# Patient Record
Sex: Female | Born: 1995 | Race: White | Hispanic: No | Marital: Married | Smoking: Current every day smoker
Health system: Southern US, Community
[De-identification: ages and names within clinical notes are randomized; demographics above are authoritative.]

## PROBLEM LIST (undated history)

## (undated) DIAGNOSIS — Z8489 Family history of other specified conditions: Secondary | ICD-10-CM

## (undated) DIAGNOSIS — O24419 Gestational diabetes mellitus in pregnancy, unspecified control: Secondary | ICD-10-CM

## (undated) DIAGNOSIS — R87619 Unspecified abnormal cytological findings in specimens from cervix uteri: Secondary | ICD-10-CM

## (undated) DIAGNOSIS — E049 Nontoxic goiter, unspecified: Secondary | ICD-10-CM

## (undated) DIAGNOSIS — Z9889 Other specified postprocedural states: Secondary | ICD-10-CM

## (undated) DIAGNOSIS — N189 Chronic kidney disease, unspecified: Secondary | ICD-10-CM

## (undated) DIAGNOSIS — E039 Hypothyroidism, unspecified: Secondary | ICD-10-CM

## (undated) DIAGNOSIS — B977 Papillomavirus as the cause of diseases classified elsewhere: Secondary | ICD-10-CM

## (undated) DIAGNOSIS — R011 Cardiac murmur, unspecified: Secondary | ICD-10-CM

## (undated) DIAGNOSIS — N200 Calculus of kidney: Secondary | ICD-10-CM

## (undated) HISTORY — DX: Calculus of kidney: N20.0

## (undated) HISTORY — DX: Gestational diabetes mellitus in pregnancy, unspecified control: O24.419

## (undated) HISTORY — DX: Unspecified abnormal cytological findings in specimens from cervix uteri: R87.619

## (undated) HISTORY — DX: Other specified postprocedural states: Z98.890

---
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
## 2015-04-26 DIAGNOSIS — R102 Pelvic and perineal pain: Secondary | ICD-10-CM | POA: Diagnosis not present

## 2015-04-26 DIAGNOSIS — O9989 Other specified diseases and conditions complicating pregnancy, childbirth and the puerperium: Secondary | ICD-10-CM | POA: Diagnosis not present

## 2015-04-26 DIAGNOSIS — O99331 Smoking (tobacco) complicating pregnancy, first trimester: Secondary | ICD-10-CM | POA: Diagnosis not present

## 2015-04-26 DIAGNOSIS — F17210 Nicotine dependence, cigarettes, uncomplicated: Secondary | ICD-10-CM | POA: Insufficient documentation

## 2015-04-26 DIAGNOSIS — Z3A01 Less than 8 weeks gestation of pregnancy: Secondary | ICD-10-CM | POA: Diagnosis not present

## 2015-04-26 DIAGNOSIS — R300 Dysuria: Secondary | ICD-10-CM | POA: Insufficient documentation

## 2015-04-26 DIAGNOSIS — Z880 Allergy status to penicillin: Secondary | ICD-10-CM | POA: Diagnosis not present

## 2015-04-26 NOTE — ED Notes (Signed)
Patient states about 10pm had the sudden onset of sharp lower abdominal pain in addition to being [redacted] weeks pregnant. Denies vaginal bleeding. States she felt like it was hard to urinate.

## 2015-04-27 ENCOUNTER — Emergency Department: Payer: Medicaid Other

## 2015-04-27 ENCOUNTER — Emergency Department
Admission: EM | Admit: 2015-04-27 | Discharge: 2015-04-27 | Disposition: A | Discharge status: Home / Self Care | Payer: Medicaid Other | Attending: Emergency Medicine | Admitting: Emergency Medicine

## 2015-04-27 DIAGNOSIS — Z3491 Encounter for supervision of normal pregnancy, unspecified, first trimester: Secondary | ICD-10-CM

## 2015-04-27 LAB — URINALYSIS COMPLETE WITH MICROSCOPIC (ARMC ONLY)
Bilirubin Urine: NEGATIVE
Glucose, UA: NEGATIVE mg/dL
HGB URINE DIPSTICK: NEGATIVE
KETONES UR: NEGATIVE mg/dL
LEUKOCYTES UA: NEGATIVE
NITRITE: NEGATIVE
Protein, ur: NEGATIVE mg/dL
SPECIFIC GRAVITY, URINE: 1.013 (ref 1.005–1.030)
pH: 7 (ref 5.0–8.0)

## 2015-04-27 LAB — HCG, QUANTITATIVE, PREGNANCY: HCG, BETA CHAIN, QUANT, S: 21469 m[IU]/mL — AB (ref <5)

## 2015-04-27 LAB — PREGNANCY, URINE: PREG TEST UR: POSITIVE — AB

## 2015-04-27 NOTE — ED Notes (Signed)
Called lab, Brooke Aguirre states they will start the test soon.  He is receiving the blood now.

## 2015-04-27 NOTE — ED Provider Notes (Signed)
[facility] Emergency Department Provider Note  ____________________________________________  Time seen: 3:20AM  I have reviewed the triage vital signs and the nursing notes.   HISTORY  Chief Complaint Abdominal Pain     HPI Brooke Aguirre is a 19 y.o. female G2P1presents with sharp pelvic pain with urination after "holding her urine for a few hours". No fever or vaginal bleeding     Past Medical History  Diagnosis Date  . Heart murmur   . Chronic kidney disease     CHRONIC  RIGHT PYELONEPHRITIS  . Family history of adverse reaction to anesthesia     PTS MOM WENT INTO COMA AFTER HYSTERECTOMY  . Thyroid goiter     There are no active problems to display for this patient.   Past Surgical History  Procedure Laterality Date  . Removal of drug delivery implant Left 12/15/2014    Procedure: REMOVAL OF DRUG DELIVERY IMPLANT;  Surgeon: Nadara Mustard, MD;  Location: [facility] ORS;  Service: Gynecology;  Laterality: Left;    Current Outpatient Rx  Name  Route  Sig  Dispense  Refill  . ondansetron (ZOFRAN ODT) 4 MG disintegrating tablet   Oral   Take 1 tablet (4 mg total) by mouth every 8 (eight) hours as needed for nausea or vomiting.   4 tablet   1   . oxyCODONE-acetaminophen (PERCOCET) 5-325 MG per tablet   Oral   Take 1 tablet by mouth every 4 (four) hours as needed for moderate pain or severe pain.   30 tablet   0     Allergies Kiwi extract; Cranberry; Penicillins; and Sulfa antibiotics  No family history on file.  Social History Social History  Substance Use Topics  . Smoking status: Current Every Day Smoker -- 1.00 packs/day for 5 years    Types: Cigarettes  . Smokeless tobacco: Not on file  . Alcohol Use: No    Review of Systems  Constitutional: Negative for fever. Eyes: Negative for visual changes. ENT: Negative for sore throat. Cardiovascular: Negative for chest pain. Respiratory: Negative for shortness of  breath. Gastrointestinal: Negative for abdominal pain, vomiting and diarrhea. Genitourinary: Positive for dysuria. Musculoskeletal: Negative for back pain. Skin: Negative for rash. Neurological: Negative for headaches, focal weakness or numbness.   10-point ROS otherwise negative.  ____________________________________________   PHYSICAL EXAM:  VITAL SIGNS: ED Triage Vitals  Enc Vitals Group     BP 04/26/15 2314 132/69 mmHg     Pulse Rate 04/26/15 2314 92     Resp 04/26/15 2314 18     Temp 04/26/15 2314 98.2 F (36.8 C)     Temp Source 04/26/15 2314 Oral     SpO2 04/26/15 2314 97 %     Weight 04/26/15 2314 170 lb (77.111 kg)     Height 04/26/15 2314  (1.499 m)     Head Cir --      Peak Flow --      Pain Score 04/26/15 2312 4     Pain Loc --      Pain Edu? --      Excl. in GC? --      Constitutional: Alert and oriented. Well appearing and in no distress. Eyes: Conjunctivae are normal. PERRL. Normal extraocular movements. ENT   Head: Normocephalic and atraumatic.   Nose: No congestion/rhinnorhea.   Mouth/Throat: Mucous membranes are moist.   Neck: No stridor. Cardiovascular: Normal rate, regular rhythm. Normal and symmetric distal pulses are present in all extremities. No  murmurs, rubs, or gallops. Respiratory: Normal respiratory effort without tachypnea nor retractions. Breath sounds are clear and equal bilaterally. No wheezes/rales/rhonchi. Gastrointestinal: Pain with palpation left pelvic region. No distention. There is no CVA tenderness. Genitourinary: deferred Musculoskeletal: Nontender with normal range of motion in all extremities. No joint effusions.  No lower extremity tenderness nor edema. Neurologic:  Normal speech and language. No gross focal neurologic deficits are appreciated. Speech is normal.  Skin:  Skin is warm, dry and intact. No rash noted. Psychiatric: Mood and affect are normal. Speech and behavior are normal. Patient exhibits  appropriate insight and judgment.  ____________________________________________    LABS (pertinent positives/negatives) Labs Reviewed  PREGNANCY, URINE - Abnormal; Notable for the following:    Preg Test, Ur POSITIVE (*)    All other components within normal limits  URINALYSIS COMPLETEWITH MICROSCOPIC ([facility] ONLY) - Abnormal; Notable for the following:    Color, Urine YELLOW (*)    APPearance CLEAR (*)    Bacteria, UA RARE (*)    Squamous Epithelial / LPF 0-5 (*)    All other components within normal limits  HCG, QUANTITATIVE, PREGNANCY - Abnormal; Notable for the following:    hCG, Beta Chain, Quant, S 21469 (*)    All other components within normal limits     RADIOLOGY    US Ob Transvaginal (Final result) Result time: 04/27/15 06:04:02   Final result by Rad Results In Interface (04/27/15 06:04:02)   Narrative:   CLINICAL DATA: Sudden onset sharp lower abdominal pain. No vaginal bleeding. Estimated gestational age by LMP is 7 weeks 3 days. Quantitative beta HCG is 21,469.  EXAM: OBSTETRIC <14 WK US AND TRANSVAGINAL OB US  TECHNIQUE: Both transabdominal and transvaginal ultrasound examinations were performed for complete evaluation of the gestation as well as the maternal uterus, adnexal regions, and pelvic cul-de-sac. Transvaginal technique was performed to assess early pregnancy.  COMPARISON: None.  FINDINGS: Intrauterine gestational sac: A single intrauterine gestational sac is visualized.  Yolk sac: Yolk sac is present.  Embryo: Fetal pole is identified.  Cardiac Activity: Fetal cardiac activity is observed.  Heart Rate: 111 bpm  CRL: 4.8 mm  6 w  1 d         US EDC: 12/20/2015  Maternal uterus/adnexae: Uterus is anteverted. No myometrial mass lesions demonstrated. No subchorionic hemorrhage. Small nabothian cysts in the cervix. Both ovaries are visualized and appear normal. Small corpus luteal cyst on the left. No abnormal adnexal  masses. No free pelvic fluid.  IMPRESSION: Single intrauterine pregnancy. Estimated gestational age by crown-rump length is 6 weeks 1 day. No acute complication identified.   Electronically Signed By: Burman NievesWilliam Stevens M.D. On: 04/27/2015 06:04          US OB Comp Less 14 Wks (Final result) Result time: 04/27/15 06:04:02   Final result by Rad Results In Interface (04/27/15 06:04:02)   Narrative:   CLINICAL DATA: Sudden onset sharp lower abdominal pain. No vaginal bleeding. Estimated gestational age by LMP is 7 weeks 3 days. Quantitative beta HCG is 21,469.  EXAM: OBSTETRIC <14 WK US AND TRANSVAGINAL OB US  TECHNIQUE: Both transabdominal and transvaginal ultrasound examinations were performed for complete evaluation of the gestation as well as the maternal uterus, adnexal regions, and pelvic cul-de-sac. Transvaginal technique was performed to assess early pregnancy.  COMPARISON: None.  FINDINGS: Intrauterine gestational sac: A single intrauterine gestational sac is visualized.  Yolk sac: Yolk sac is present.  Embryo: Fetal pole is identified.  Cardiac Activity: Fetal cardiac activity  is observed.  Heart Rate: 111 bpm  CRL: 4.8 mm  6 w  1 d         [place] EDC: 12/20/2015  Maternal uterus/adnexae: Uterus is anteverted. No myometrial mass lesions demonstrated. No subchorionic hemorrhage. Small nabothian cysts in the cervix. Both ovaries are visualized and appear normal. Small corpus luteal cyst on the left. No abnormal adnexal masses. No free pelvic fluid.  IMPRESSION: Single intrauterine pregnancy. Estimated gestational age by crown-rump length is 6 weeks 1 day. No acute complication identified.   Electronically Signed By: Burman Nieves M.D. On: 04/27/2015 06:04          INITIAL IMPRESSION / ASSESSMENT AND PLAN / ED COURSE  Pertinent labs & imaging results that were available during my care of the patient were reviewed  by me and considered in my medical decision making (see chart for details).  Urinalysis negative, no CVA tenderness.  ____________________________________________   FINAL CLINICAL IMPRESSION(S) / ED DIAGNOSES  Final diagnoses:  First trimester pregnancy      Darci Current, MD 04/27/15 [phone]

## 2015-04-27 NOTE — Discharge Instructions (Signed)
First Trimester of Pregnancy The first trimester of pregnancy is from week 1 until the end of week 12 (months 1 through 3). A week after a sperm fertilizes an egg, the egg will implant on the wall of the uterus. This embryo will begin to develop into a baby. Genes from you and your partner are forming the baby. The female genes determine whether the baby is a boy or a girl. At 6-8 weeks, the eyes and face are formed, and the heartbeat can be seen on ultrasound. At the end of 12 weeks, all the baby's organs are formed.  Now that you are pregnant, you will want to do everything you can to have a healthy baby. Two of the most important things are to get good prenatal care and to follow your health care provider's instructions. Prenatal care is all the medical care you receive before the baby's birth. This care will help prevent, find, and treat any problems during the pregnancy and childbirth. BODY CHANGES Your body goes through many changes during pregnancy. The changes vary from woman to woman.   You may gain or lose a couple of pounds at first.  You may feel sick to your stomach (nauseous) and throw up (vomit). If the vomiting is uncontrollable, call your health care provider.  You may tire easily.  You may develop headaches that can be relieved by medicines approved by your health care provider.  You may urinate more often. Painful urination may mean you have a bladder infection.  You may develop heartburn as a result of your pregnancy.  You may develop constipation because certain hormones are causing the muscles that push waste through your intestines to slow down.  You may develop hemorrhoids or swollen, bulging veins (varicose veins).  Your breasts may begin to grow larger and become tender. Your nipples may stick out more, and the tissue that surrounds them (areola) may become darker.  Your gums may bleed and may be sensitive to brushing and flossing.  Dark spots or blotches (chloasma,  mask of pregnancy) may develop on your face. This will likely fade after the baby is born.  Your menstrual periods will stop.  You may have a loss of appetite.  You may develop cravings for certain kinds of food.  You may have changes in your emotions from day to day, such as being excited to be pregnant or being concerned that something may go wrong with the pregnancy and baby.  You may have more vivid and strange dreams.  You may have changes in your hair. These can include thickening of your hair, rapid growth, and changes in texture. Some women also have hair loss during or after pregnancy, or hair that feels dry or thin. Your hair will most likely return to normal after your baby is born. WHAT TO EXPECT AT YOUR PRENATAL VISITS During a routine prenatal visit:  You will be weighed to make sure you and the baby are growing normally.  Your blood pressure will be taken.  Your abdomen will be measured to track your baby's growth.  The fetal heartbeat will be listened to starting around week 10 or 12 of your pregnancy.  Test results from any previous visits will be discussed. Your health care provider may ask you:  How you are feeling.  If you are feeling the baby move.  If you have had any abnormal symptoms, such as leaking fluid, bleeding, severe headaches, or abdominal cramping.  If you are using any tobacco products,   including cigarettes, chewing tobacco, and electronic cigarettes.  If you have any questions. Other tests that may be performed during your first trimester include:  Blood tests to find your blood type and to check for the presence of any previous infections. They will also be used to check for low iron levels (anemia) and Rh antibodies. Later in the pregnancy, blood tests for diabetes will be done along with other tests if problems develop.  Urine tests to check for infections, diabetes, or protein in the urine.  An ultrasound to confirm the proper growth  and development of the baby.  An amniocentesis to check for possible genetic problems.  Fetal screens for spina bifida and Down syndrome.  You may need other tests to make sure you and the baby are doing well.  HIV (human immunodeficiency virus) testing. Routine prenatal testing includes screening for HIV, unless you choose not to have this test. HOME CARE INSTRUCTIONS  Medicines  Follow your health care provider's instructions regarding medicine use. Specific medicines may be either safe or unsafe to take during pregnancy.  Take your prenatal vitamins as directed.  If you develop constipation, try taking a stool softener if your health care provider approves. Diet  Eat regular, well-balanced meals. Choose a variety of foods, such as meat or vegetable-based protein, fish, milk and low-fat dairy products, vegetables, fruits, and whole grain breads and cereals. Your health care provider will help you determine the amount of weight gain that is right for you.  Avoid raw meat and uncooked cheese. These carry germs that can cause birth defects in the baby.  Eating four or five small meals rather than three large meals a day may help relieve nausea and vomiting. If you start to feel nauseous, eating a few soda crackers can be helpful. Drinking liquids between meals instead of during meals also seems to help nausea and vomiting.  If you develop constipation, eat more high-fiber foods, such as fresh vegetables or fruit and whole grains. Drink enough fluids to keep your urine clear or pale yellow. Activity and Exercise  Exercise only as directed by your health care provider. Exercising will help you:  Control your weight.  Stay in shape.  Be prepared for labor and delivery.  Experiencing pain or cramping in the lower abdomen or low back is a good sign that you should stop exercising. Check with your health care provider before continuing normal exercises.  Try to avoid standing for long  periods of time. Move your legs often if you must stand in one place for a long time.  Avoid heavy lifting.  Wear low-heeled shoes, and practice good posture.  You may continue to have sex unless your health care provider directs you otherwise. Relief of Pain or Discomfort  Wear a good support bra for breast tenderness.   Take warm sitz baths to soothe any pain or discomfort caused by hemorrhoids. Use hemorrhoid cream if your health care provider approves.   Rest with your legs elevated if you have leg cramps or low back pain.  If you develop varicose veins in your legs, wear support hose. Elevate your feet for 15 minutes, 3-4 times a day. Limit salt in your diet. Prenatal Care  Schedule your prenatal visits by the twelfth week of pregnancy. They are usually scheduled monthly at first, then more often in the last 2 months before delivery.  Write down your questions. Take them to your prenatal visits.  Keep all your prenatal visits as directed by your   health care provider. Safety  Wear your seat belt at all times when driving.  Make a list of emergency phone numbers, including numbers for family, friends, the hospital, and police and fire departments. General Tips  Ask your health care provider for a referral to a local prenatal education class. Begin classes no later than at the beginning of month 6 of your pregnancy.  Ask for help if you have counseling or nutritional needs during pregnancy. Your health care provider can offer advice or refer you to specialists for help with various needs.  Do not use hot tubs, steam rooms, or saunas.  Do not douche or use tampons or scented sanitary pads.  Do not cross your legs for long periods of time.  Avoid cat litter boxes and soil used by cats. These carry germs that can cause birth defects in the baby and possibly loss of the fetus by miscarriage or stillbirth.  Avoid all smoking, herbs, alcohol, and medicines not prescribed by  your health care provider. Chemicals in these affect the formation and growth of the baby.  Do not use any tobacco products, including cigarettes, chewing tobacco, and electronic cigarettes. If you need help quitting, ask your health care provider. You may receive counseling support and other resources to help you quit.  Schedule a dentist appointment. At home, brush your teeth with a soft toothbrush and be gentle when you floss. SEEK MEDICAL CARE IF:   You have dizziness.  You have mild pelvic cramps, pelvic pressure, or nagging pain in the abdominal area.  You have persistent nausea, vomiting, or diarrhea.  You have a bad smelling vaginal discharge.  You have pain with urination.  You notice increased swelling in your face, hands, legs, or ankles. SEEK IMMEDIATE MEDICAL CARE IF:   You have a fever.  You are leaking fluid from your vagina.  You have spotting or bleeding from your vagina.  You have severe abdominal cramping or pain.  You have rapid weight gain or loss.  You vomit blood or material that looks like coffee grounds.  You are exposed to German measles and have never had them.  You are exposed to fifth disease or chickenpox.  You develop a severe headache.  You have shortness of breath.  You have any kind of trauma, such as from a fall or a car accident.   This information is not intended to replace advice given to you by your health care provider. Make sure you discuss any questions you have with your health care provider.   Document Released: 06/24/2001 Document Revised: 07/21/2014 Document Reviewed: 05/10/2013 Elsevier Interactive Patient Education 2016 [employer]  

## 2015-05-14 LAB — OB RESULTS CONSOLE HEPATITIS B SURFACE ANTIGEN: Hepatitis B Surface Ag: NEGATIVE

## 2015-05-14 LAB — OB RESULTS CONSOLE RUBELLA ANTIBODY, IGM: Rubella: IMMUNE

## 2015-05-14 LAB — OB RESULTS CONSOLE VARICELLA ZOSTER ANTIBODY, IGG: Varicella: IMMUNE

## 2015-07-15 NOTE — L&D Delivery Note (Signed)
Obstetrical Delivery Note   Date of Delivery:   11/30/2015 Primary OB:   [facility] Gestational Age/EDD: 4177w1d Antepartum complications: severe FGR (<5%), BMI 35, tobacco abuse  Delivered By:   Cornelia Copaharlie Pickens, Jr MD  Delivery Type:   spontaneous vaginal delivery  Delivery Details:   CTSP because went from 4cm-anterior lip in <516m. Room set up and recheck was complete and +2. Peds present and over two contractions patient easily delivered vigorous female infant in OA position. Delayed cord clamping and cut by father of the baby. 1 o'clock periuretheral bleeder despite pressure. New sterile gloves put on and area prepped and foley catheter placed. 3-0 plain gut figure of eight superficially placed and area hemostatic. Perineum intact. Will leave foley in x 24hrs Anesthesia:    epidural Intrapartum complications: None GBS:    Negative Laceration:    Periurethral 1 o'clock repaired (see above) Episiotomy:    none Placenta:    Delivered and expressed via active management. Intact: yes. To pathology: yes.  Estimated Blood Loss:  200mL  Baby:    Liveborn female, APGARs 8/9, weight 2240gm  Cornelia Copaharlie Pickens, Jr. MD [facility] Pager [phone]

## 2015-07-30 ENCOUNTER — Encounter: Payer: Self-pay | Admitting: Emergency Medicine

## 2015-07-30 ENCOUNTER — Emergency Department
Admission: EM | Admit: 2015-07-30 | Discharge: 2015-07-31 | Disposition: A | Discharge status: Home / Self Care | Payer: Medicaid Other | Attending: Emergency Medicine | Admitting: Emergency Medicine

## 2015-07-30 DIAGNOSIS — O99332 Smoking (tobacco) complicating pregnancy, second trimester: Secondary | ICD-10-CM | POA: Insufficient documentation

## 2015-07-30 DIAGNOSIS — O210 Mild hyperemesis gravidarum: Secondary | ICD-10-CM | POA: Insufficient documentation

## 2015-07-30 DIAGNOSIS — F17210 Nicotine dependence, cigarettes, uncomplicated: Secondary | ICD-10-CM | POA: Insufficient documentation

## 2015-07-30 DIAGNOSIS — Z3490 Encounter for supervision of normal pregnancy, unspecified, unspecified trimester: Secondary | ICD-10-CM

## 2015-07-30 DIAGNOSIS — Z3A19 19 weeks gestation of pregnancy: Secondary | ICD-10-CM | POA: Diagnosis not present

## 2015-07-30 DIAGNOSIS — O9989 Other specified diseases and conditions complicating pregnancy, childbirth and the puerperium: Secondary | ICD-10-CM | POA: Insufficient documentation

## 2015-07-30 DIAGNOSIS — R197 Diarrhea, unspecified: Secondary | ICD-10-CM | POA: Diagnosis not present

## 2015-07-30 DIAGNOSIS — Z880 Allergy status to penicillin: Secondary | ICD-10-CM | POA: Diagnosis not present

## 2015-07-30 DIAGNOSIS — R112 Nausea with vomiting, unspecified: Secondary | ICD-10-CM

## 2015-07-30 MED ORDER — SODIUM CHLORIDE 0.9 % IV BOLUS (SEPSIS)
1000.0000 mL | Freq: Once | INTRAVENOUS | Status: AC
  Administered 2015-07-31: 1000 mL via INTRAVENOUS

## 2015-07-30 MED ORDER — ONDANSETRON HCL 4 MG/2ML IJ SOLN
4.0000 mg | Freq: Once | INTRAMUSCULAR | Status: AC
  Administered 2015-07-31: 4 mg via INTRAVENOUS
  Filled 2015-07-30: qty 2

## 2015-07-30 NOTE — ED Provider Notes (Signed)
[facility] Emergency Department Provider Note  ____________________________________________  Time seen: Approximately 11:37 PM  I have reviewed the triage vital signs and the nursing notes.   HISTORY  Chief Complaint Emesis and Diarrhea    HPI Brooke Aguirre is a 20 y.o. female who presents to the ED from work with a chief complaint of N/V/D. Patientworks at [employer] and states multiple coworkers are sick with gastrointestinal symptoms. Patient is a G2 P1 approximately [redacted] weeks pregnant. Reports onset of symptoms approximately 3 hours prior to arrival. Denies associated fever, chills, chest pain, shortness of breath, abdominal pain, dysuria, vaginal bleeding. Denies recent travel or trauma. Reports approximately 5 episodes of vomiting and constant diarrhea.   Past Medical History  Diagnosis Date  . Heart murmur   . Chronic kidney disease     CHRONIC  RIGHT PYELONEPHRITIS  . Family history of adverse reaction to anesthesia     PTS MOM WENT INTO COMA AFTER HYSTERECTOMY  . Thyroid goiter     There are no active problems to display for this patient.   Past Surgical History  Procedure Laterality Date  . Removal of drug delivery implant Left 12/15/2014    Procedure: REMOVAL OF DRUG DELIVERY IMPLANT;  Surgeon: Nadara Mustardobert P Harris, MD;  Location: [facility] ORS;  Service: Gynecology;  Laterality: Left;    Current Outpatient Rx  Name  Route  Sig  Dispense  Refill  . ondansetron (ZOFRAN ODT) 4 MG disintegrating tablet   Oral   Take 1 tablet (4 mg total) by mouth every 8 (eight) hours as needed for nausea or vomiting.   4 tablet   1   . oxyCODONE-acetaminophen (PERCOCET) 5-325 MG per tablet   Oral   Take 1 tablet by mouth every 4 (four) hours as needed for moderate pain or severe pain.   30 tablet   0     Allergies Kiwi extract; Cranberry; Penicillins; and Sulfa antibiotics  No family history on file.  Social History Social History  Substance Use  Topics  . Smoking status: Current Every Day Smoker -- 1.00 packs/day for 5 years    Types: Cigarettes  . Smokeless tobacco: None  . Alcohol Use: No    Review of Systems Constitutional: No fever/chills Eyes: No visual changes. ENT: No sore throat. Cardiovascular: Denies chest pain. Respiratory: Denies shortness of breath. Gastrointestinal: No abdominal pain.  Positive for nausea, vomiting and diarrhea.  No constipation. Genitourinary: Negative for dysuria. Musculoskeletal: Negative for back pain. Skin: Negative for rash. Neurological: Negative for headaches, focal weakness or numbness.  10-point ROS otherwise negative.  ____________________________________________   PHYSICAL EXAM:  VITAL SIGNS: ED Triage Vitals  Enc Vitals Group     BP 07/30/15 2313 106/62 mmHg     Pulse Rate 07/30/15 2313 114     Resp 07/30/15 2313 18     Temp 07/30/15 2313 98.3 F (36.8 C)     Temp Source 07/30/15 2313 Oral     SpO2 07/30/15 2313 97 %     Weight 07/30/15 2313 155 lb (70.308 kg)     Height 07/30/15 2313 4\' 11"  (1.499 m)     Head Cir --      Peak Flow --      Pain Score 07/30/15 2313 2     Pain Loc --      Pain Edu? --      Excl. in GC? --     Constitutional: Alert and oriented. Well appearing and in mild acute  distress. Eyes: Conjunctivae are normal. PERRL. EOMI. Head: Atraumatic. Nose: No congestion/rhinnorhea. Mouth/Throat: Mucous membranes are moist.  Oropharynx non-erythematous. Neck: No stridor.   Cardiovascular: Normal rate, regular rhythm. Grossly normal heart sounds.  Good peripheral circulation. Respiratory: Normal respiratory effort.  No retractions. Lungs CTAB. Gastrointestinal: Soft and nontender. Gravid. No distention. No abdominal bruits. No CVA tenderness. Musculoskeletal: No lower extremity tenderness nor edema.  No joint effusions. Neurologic:  Normal speech and language. No gross focal neurologic deficits are appreciated. No gait instability. Skin:  Skin is  warm, dry and intact. No rash noted. Psychiatric: Mood and affect are normal. Speech and behavior are normal.  ____________________________________________   LABS (all labs ordered are listed, but only abnormal results are displayed)  Labs Reviewed  CBC WITH DIFFERENTIAL/PLATELET - Abnormal; Notable for the following:    WBC 19.3 (*)    Neutro Abs 17.4 (*)    All other components within normal limits  BASIC METABOLIC PANEL - Abnormal; Notable for the following:    CO2 20 (*)    Glucose, Bld 104 (*)    All other components within normal limits  URINALYSIS COMPLETEWITH MICROSCOPIC ([facility] ONLY) - Abnormal; Notable for the following:    Color, Urine YELLOW (*)    APPearance CLEAR (*)    Ketones, ur 2+ (*)    Bacteria, UA FEW (*)    Squamous Epithelial / LPF 0-5 (*)    All other components within normal limits   ____________________________________________  EKG  None ____________________________________________  RADIOLOGY  None ____________________________________________   PROCEDURES  Procedure(s) performed: None  Critical Care performed: No  ____________________________________________   INITIAL IMPRESSION / ASSESSMENT AND PLAN / ED COURSE  Pertinent labs & imaging results that were available during my care of the patient were reviewed by me and considered in my medical decision making (see chart for details).  20 year old female G2 P1 approximately [redacted] weeks pregnant who presents with N/V/D. Will initiate IV fluid resuscitation, IV Zofran, check screening lab work including urinalysis and reassess.  ----------------------------------------- 12:27 AM on 07/31/2015 -----------------------------------------  Nausea is improved after IV Zofran. Patient asking for ice chips.  ----------------------------------------- 2:30 AM on 07/31/2015 -----------------------------------------  No vomiting but patient complains of nausea. Phenergan ordered. Will hang second  liter IV fluids.  ----------------------------------------- 3:41 AM on 07/31/2015 -----------------------------------------  Patient tolerated PO (Coke) without vomiting. She had an additional dose of IV Zofran as she was driving. Will discharge home with prescription for Phenergan. Strict return precautions given. Patient verbalizes understanding and agree with plan of care. ____________________________________________   FINAL CLINICAL IMPRESSION(S) / ED DIAGNOSES  Final diagnoses:  Non-intractable vomiting with nausea, vomiting of unspecified type  Diarrhea, unspecified type  Pregnancy      Irean Hong, MD 07/31/15 [phone]

## 2015-07-30 NOTE — ED Notes (Addendum)
Pt to triage via w/c with no distress noted; pt reports N/V/D x 3 hours with mid abd pain; pt [redacted]wks pregnant with EDC 6/8; [facility]

## 2015-07-31 LAB — URINALYSIS COMPLETE WITH MICROSCOPIC (ARMC ONLY)
Bilirubin Urine: NEGATIVE
Glucose, UA: NEGATIVE mg/dL
Hgb urine dipstick: NEGATIVE
Leukocytes, UA: NEGATIVE
Nitrite: NEGATIVE
PROTEIN: NEGATIVE mg/dL
SPECIFIC GRAVITY, URINE: 1.018 (ref 1.005–1.030)
pH: 5 (ref 5.0–8.0)

## 2015-07-31 LAB — BASIC METABOLIC PANEL
Anion gap: 8 (ref 5–15)
BUN: 8 mg/dL (ref 6–20)
CHLORIDE: 108 mmol/L (ref 101–111)
CO2: 20 mmol/L — ABNORMAL LOW (ref 22–32)
CREATININE: 0.48 mg/dL (ref 0.44–1.00)
Calcium: 9.3 mg/dL (ref 8.9–10.3)
GFR calc Af Amer: >60 mL/min (ref >60)
GFR calc non Af Amer: >60 mL/min (ref >60)
Glucose, Bld: 104 mg/dL — ABNORMAL HIGH (ref 65–99)
POTASSIUM: 3.5 mmol/L (ref 3.5–5.1)
SODIUM: 136 mmol/L (ref 135–145)

## 2015-07-31 LAB — CBC WITH DIFFERENTIAL/PLATELET
BASOS PCT: 0 %
Basophils Absolute: 0.1 10*3/uL (ref 0–0.1)
EOS ABS: 0 10*3/uL (ref 0–0.7)
Eosinophils Relative: 0 %
HCT: 37.9 % (ref 35.0–47.0)
HEMOGLOBIN: 13.2 g/dL (ref 12.0–16.0)
Lymphocytes Relative: 5 %
Lymphs Abs: 1 10*3/uL (ref 1.0–3.6)
MCH: 32.1 pg (ref 26.0–34.0)
MCHC: 34.8 g/dL (ref 32.0–36.0)
MCV: 92.3 fL (ref 80.0–100.0)
Monocytes Absolute: 0.8 10*3/uL (ref 0.2–0.9)
Monocytes Relative: 4 %
NEUTROS PCT: 91 %
Neutro Abs: 17.4 10*3/uL — ABNORMAL HIGH (ref 1.4–6.5)
Platelets: 192 10*3/uL (ref 150–440)
RBC: 4.11 MIL/uL (ref 3.80–5.20)
RDW: 12.8 % (ref 11.5–14.5)
WBC: 19.3 10*3/uL — AB (ref 3.6–11.0)

## 2015-07-31 MED ORDER — PROMETHAZINE HCL 25 MG PO TABS
25.0000 mg | ORAL_TABLET | Freq: Once | ORAL | Status: AC
  Administered 2015-07-31: 25 mg via ORAL
  Filled 2015-07-31: qty 1

## 2015-07-31 MED ORDER — PROMETHAZINE HCL 25 MG PO TABS: 25.0000 mg | ORAL_TABLET | Freq: Four times a day (QID) | ORAL | Status: DC | PRN

## 2015-07-31 MED ORDER — DEXTROSE-NACL 5-0.45 % IV SOLN
INTRAVENOUS | Status: DC
  Administered 2015-07-31: 03:00:00 via INTRAVENOUS

## 2015-07-31 MED ORDER — ONDANSETRON HCL 4 MG/2ML IJ SOLN
4.0000 mg | Freq: Once | INTRAMUSCULAR | Status: AC
  Administered 2015-07-31: 4 mg via INTRAVENOUS
  Filled 2015-07-31: qty 2

## 2015-07-31 MED ORDER — PROMETHAZINE HCL 25 MG/ML IJ SOLN
12.5000 mg | Freq: Once | INTRAMUSCULAR | Status: DC
  Filled 2015-07-31: qty 1

## 2015-07-31 NOTE — Discharge Instructions (Signed)
1. You may take Phenergan as needed for nausea (#20). 2. Clear liquids 12 hours, then BRAT diet 3 days, then slowly advance diet as tolerated. 3. Return to the ER for worsening symptoms, persistent vomiting, vaginal bleeding or other concerns.  Nausea and Vomiting Nausea is a sick feeling that often comes before throwing up (vomiting). Vomiting is a reflex where stomach contents come out of your mouth. Vomiting can cause severe loss of body fluids (dehydration). Children and elderly adults can become dehydrated quickly, especially if they also have diarrhea. Nausea and vomiting are symptoms of a condition or disease. It is important to find the cause of your symptoms. CAUSES   Direct irritation of the stomach lining. This irritation can result from increased acid production (gastroesophageal reflux disease), infection, food poisoning, taking certain medicines (such as nonsteroidal anti-inflammatory drugs), alcohol use, or tobacco use.  Signals from the brain.These signals could be caused by a headache, heat exposure, an inner ear disturbance, increased pressure in the brain from injury, infection, a tumor, or a concussion, pain, emotional stimulus, or metabolic problems.  An obstruction in the gastrointestinal tract (bowel obstruction).  Illnesses such as diabetes, hepatitis, gallbladder problems, appendicitis, kidney problems, cancer, sepsis, atypical symptoms of a heart attack, or eating disorders.  Medical treatments such as chemotherapy and radiation.  Receiving medicine that makes you sleep (general anesthetic) during surgery. DIAGNOSIS Your caregiver may ask for tests to be done if the problems do not improve after a few days. Tests may also be done if symptoms are severe or if the reason for the nausea and vomiting is not clear. Tests may include:  Urine tests.  Blood tests.  Stool tests.  Cultures (to look for evidence of infection).  X-rays or other imaging studies. Test  results can help your caregiver make decisions about treatment or the need for additional tests. TREATMENT You need to stay well hydrated. Drink frequently but in small amounts.You may wish to drink water, sports drinks, clear broth, or eat frozen ice pops or gelatin dessert to help stay hydrated.When you eat, eating slowly may help prevent nausea.There are also some antinausea medicines that may help prevent nausea. HOME CARE INSTRUCTIONS   Take all medicine as directed by your caregiver.  If you do not have an appetite, do not force yourself to eat. However, you must continue to drink fluids.  If you have an appetite, eat a normal diet unless your caregiver tells you differently.  Eat a variety of complex carbohydrates (rice, wheat, potatoes, bread), lean meats, yogurt, fruits, and vegetables.  Avoid high-fat foods because they are more difficult to digest.  Drink enough water and fluids to keep your urine clear or pale yellow.  If you are dehydrated, ask your caregiver for specific rehydration instructions. Signs of dehydration may include:  Severe thirst.  Dry lips and mouth.  Dizziness.  Dark urine.  Decreasing urine frequency and amount.  Confusion.  Rapid breathing or pulse. SEEK IMMEDIATE MEDICAL CARE IF:   You have blood or brown flecks (like coffee grounds) in your vomit.  You have black or bloody stools.  You have a severe headache or stiff neck.  You are confused.  You have severe abdominal pain.  You have chest pain or trouble breathing.  You do not urinate at least once every 8 hours.  You develop cold or clammy skin.  You continue to vomit for longer than 24 to 48 hours.  You have a fever. MAKE SURE YOU:   Understand  these instructions.  Will watch your condition.  Will get help right away if you are not doing well or get worse.   This information is not intended to replace advice given to you by your health care provider. Make sure you  discuss any questions you have with your health care provider.   Document Released: 06/30/2005 Document Revised: 09/22/2011 Document Reviewed: 11/27/2010 Elsevier Interactive Patient Education 2016 Elsevier Inc.  Diarrhea Diarrhea is frequent loose and watery bowel movements. It can cause you to feel weak and dehydrated. Dehydration can cause you to become tired and thirsty, have a dry mouth, and have decreased urination that often is dark yellow. Diarrhea is a sign of another problem, most often an infection that will not last long. In most cases, diarrhea typically lasts 2-3 days. However, it can last longer if it is a sign of something more serious. It is important to treat your diarrhea as directed by your caregiver to lessen or prevent future episodes of diarrhea. CAUSES  Some common causes include:  Gastrointestinal infections caused by viruses, bacteria, or parasites.  Food poisoning or food allergies.  Certain medicines, such as antibiotics, chemotherapy, and laxatives.  Artificial sweeteners and fructose.  Digestive disorders. HOME CARE INSTRUCTIONS  Ensure adequate fluid intake (hydration): Have 1 cup (8 oz) of fluid for each diarrhea episode. Avoid fluids that contain simple sugars or sports drinks, fruit juices, whole milk products, and sodas. Your urine should be clear or pale yellow if you are drinking enough fluids. Hydrate with an oral rehydration solution that you can purchase at pharmacies, retail stores, and online. You can prepare an oral rehydration solution at home by mixing the following ingredients together:   - tsp table salt.   tsp baking soda.   tsp salt substitute containing potassium chloride.  1  tablespoons sugar.  1 L (34 oz) of water.  Certain foods and beverages may increase the speed at which food moves through the gastrointestinal (GI) tract. These foods and beverages should be avoided and include:  Caffeinated and alcoholic  beverages.  High-fiber foods, such as raw fruits and vegetables, nuts, seeds, and whole grain breads and cereals.  Foods and beverages sweetened with sugar alcohols, such as xylitol, sorbitol, and mannitol.  Some foods may be well tolerated and may help thicken stool including:  Starchy foods, such as rice, toast, pasta, low-sugar cereal, oatmeal, grits, baked potatoes, crackers, and bagels.  Bananas.  Applesauce.  Add probiotic-rich foods to help increase healthy bacteria in the GI tract, such as yogurt and fermented milk products.  Wash your hands well after each diarrhea episode.  Only take over-the-counter or prescription medicines as directed by your caregiver.  Take a warm bath to relieve any burning or pain from frequent diarrhea episodes. SEEK IMMEDIATE MEDICAL CARE IF:   You are unable to keep fluids down.  You have persistent vomiting.  You have blood in your stool, or your stools are black and tarry.  You do not urinate in 6-8 hours, or there is only a small amount of very dark urine.  You have abdominal pain that increases or localizes.  You have weakness, dizziness, confusion, or light-headedness.  You have a severe headache.  Your diarrhea gets worse or does not get better.  You have a fever or persistent symptoms for more than 2-3 days.  You have a fever and your symptoms suddenly get worse. MAKE SURE YOU:   Understand these instructions.  Will watch your condition.  Will get  help right away if you are not doing well or get worse.   This information is not intended to replace advice given to you by your health care provider. Make sure you discuss any questions you have with your health care provider.   Document Released: 06/20/2002 Document Revised: 07/21/2014 Document Reviewed: 03/07/2012 Elsevier Interactive Patient Education 2016 [employer].  Second Trimester of Pregnancy The second trimester is from week 13 through week 28, months 4  through 6. The second trimester is often a time when you feel your best. Your body has also adjusted to being pregnant, and you begin to feel better physically. Usually, morning sickness has lessened or quit completely, you may have more energy, and you may have an increase in appetite. The second trimester is also a time when the fetus is growing rapidly. At the end of the sixth month, the fetus is about 9 inches long and weighs about 1 pounds. You will likely begin to feel the baby move (quickening) between 18 and 20 weeks of the pregnancy. BODY CHANGES Your body goes through many changes during pregnancy. The changes vary from woman to woman.   Your weight will continue to increase. You will notice your lower abdomen bulging out.  You may begin to get stretch marks on your hips, abdomen, and breasts.  You may develop headaches that can be relieved by medicines approved by your health care provider.  You may urinate more often because the fetus is pressing on your bladder.  You may develop or continue to have heartburn as a result of your pregnancy.  You may develop constipation because certain hormones are causing the muscles that push waste through your intestines to slow down.  You may develop hemorrhoids or swollen, bulging veins (varicose veins).  You may have back pain because of the weight gain and pregnancy hormones relaxing your joints between the bones in your pelvis and as a result of a shift in weight and the muscles that support your balance.  Your breasts will continue to grow and be tender.  Your gums may bleed and may be sensitive to brushing and flossing.  Dark spots or blotches (chloasma, mask of pregnancy) may develop on your face. This will likely fade after the baby is born.  A dark line from your belly button to the pubic area (linea nigra) may appear. This will likely fade after the baby is born.  You may have changes in your hair. These can include thickening of  your hair, rapid growth, and changes in texture. Some women also have hair loss during or after pregnancy, or hair that feels dry or thin. Your hair will most likely return to normal after your baby is born. WHAT TO EXPECT AT YOUR PRENATAL VISITS During a routine prenatal visit:  You will be weighed to make sure you and the fetus are growing normally.  Your blood pressure will be taken.  Your abdomen will be measured to track your baby's growth.  The fetal heartbeat will be listened to.  Any test results from the previous visit will be discussed. Your health care provider may ask you:  How you are feeling.  If you are feeling the baby move.  If you have had any abnormal symptoms, such as leaking fluid, bleeding, severe headaches, or abdominal cramping.  If you are using any tobacco products, including cigarettes, chewing tobacco, and electronic cigarettes.  If you have any questions. Other tests that may be performed during your second trimester include:  Blood tests that check for:  Low iron levels (anemia).  Gestational diabetes (between 24 and 28 weeks).  Rh antibodies.  Urine tests to check for infections, diabetes, or protein in the urine.  An ultrasound to confirm the proper growth and development of the baby.  An amniocentesis to check for possible genetic problems.  Fetal screens for spina bifida and Down syndrome.  HIV (human immunodeficiency virus) testing. Routine prenatal testing includes screening for HIV, unless you choose not to have this test. HOME CARE INSTRUCTIONS   Avoid all smoking, herbs, alcohol, and unprescribed drugs. These chemicals affect the formation and growth of the baby.  Do not use any tobacco products, including cigarettes, chewing tobacco, and electronic cigarettes. If you need help quitting, ask your health care provider. You may receive counseling support and other resources to help you quit.  Follow your health care provider's  instructions regarding medicine use. There are medicines that are either safe or unsafe to take during pregnancy.  Exercise only as directed by your health care provider. Experiencing uterine cramps is a good sign to stop exercising.  Continue to eat regular, healthy meals.  Wear a good support bra for breast tenderness.  Do not use hot tubs, steam rooms, or saunas.  Wear your seat belt at all times when driving.  Avoid raw meat, uncooked cheese, cat litter boxes, and soil used by cats. These carry germs that can cause birth defects in the baby.  Take your prenatal vitamins.  Take 1500-2000 mg of calcium daily starting at the 20th week of pregnancy until you deliver your baby.  Try taking a stool softener (if your health care provider approves) if you develop constipation. Eat more high-fiber foods, such as fresh vegetables or fruit and whole grains. Drink plenty of fluids to keep your urine clear or pale yellow.  Take warm sitz baths to soothe any pain or discomfort caused by hemorrhoids. Use hemorrhoid cream if your health care provider approves.  If you develop varicose veins, wear support hose. Elevate your feet for 15 minutes, 3-4 times a day. Limit salt in your diet.  Avoid heavy lifting, wear low heel shoes, and practice good posture.  Rest with your legs elevated if you have leg cramps or low back pain.  Visit your dentist if you have not gone yet during your pregnancy. Use a soft toothbrush to brush your teeth and be gentle when you floss.  A sexual relationship may be continued unless your health care provider directs you otherwise.  Continue to go to all your prenatal visits as directed by your health care provider. SEEK MEDICAL CARE IF:   You have dizziness.  You have mild pelvic cramps, pelvic pressure, or nagging pain in the abdominal area.  You have persistent nausea, vomiting, or diarrhea.  You have a bad smelling vaginal discharge.  You have pain with  urination. SEEK IMMEDIATE MEDICAL CARE IF:   You have a fever.  You are leaking fluid from your vagina.  You have spotting or bleeding from your vagina.  You have severe abdominal cramping or pain.  You have rapid weight gain or loss.  You have shortness of breath with chest pain.  You notice sudden or extreme swelling of your face, hands, ankles, feet, or legs.  You have not felt your baby move in over an hour.  You have severe headaches that do not go away with medicine.  You have vision changes.   This information is not intended to replace  advice given to you by your health care provider. Make sure you discuss any questions you have with your health care provider.   Document Released: 06/24/2001 Document Revised: 07/21/2014 Document Reviewed: 08/31/2012 Elsevier Interactive Patient Education [employer]2016 Elsevier Inc.

## 2015-07-31 NOTE — ED Notes (Signed)
FHT = 160 bpm, [place] verified.

## 2015-09-27 LAB — OB RESULTS CONSOLE HIV ANTIBODY (ROUTINE TESTING): HIV: NONREACTIVE

## 2015-09-27 LAB — OB RESULTS CONSOLE RPR: RPR: NONREACTIVE

## 2015-11-22 LAB — OB RESULTS CONSOLE GBS: STREP GROUP B AG: NEGATIVE

## 2015-11-28 ENCOUNTER — Inpatient Hospital Stay
Admission: EM | Admit: 2015-11-28 | Discharge: 2015-12-01 | DRG: 775 | Disposition: A | Discharge status: Home / Self Care | Payer: Medicaid Other | Attending: Obstetrics and Gynecology | Admitting: Obstetrics and Gynecology

## 2015-11-28 DIAGNOSIS — O99334 Smoking (tobacco) complicating childbirth: Secondary | ICD-10-CM | POA: Diagnosis present

## 2015-11-28 DIAGNOSIS — F17210 Nicotine dependence, cigarettes, uncomplicated: Secondary | ICD-10-CM | POA: Diagnosis present

## 2015-11-28 DIAGNOSIS — Z23 Encounter for immunization: Secondary | ICD-10-CM | POA: Diagnosis not present

## 2015-11-28 DIAGNOSIS — O365930 Maternal care for other known or suspected poor fetal growth, third trimester, not applicable or unspecified: Secondary | ICD-10-CM | POA: Diagnosis present

## 2015-11-28 DIAGNOSIS — Z3A37 37 weeks gestation of pregnancy: Secondary | ICD-10-CM | POA: Diagnosis not present

## 2015-11-28 DIAGNOSIS — IMO0002 Reserved for concepts with insufficient information to code with codable children: Secondary | ICD-10-CM | POA: Diagnosis present

## 2015-11-28 LAB — CHLAMYDIA/NGC RT PCR (ARMC ONLY)
CHLAMYDIA TR: NOT DETECTED
N GONORRHOEAE: NOT DETECTED

## 2015-11-28 LAB — TYPE AND SCREEN
ABO/RH(D): A POS
ANTIBODY SCREEN: NEGATIVE

## 2015-11-28 LAB — CBC
HEMATOCRIT: 32.2 % — AB (ref 35.0–47.0)
Hemoglobin: 11.4 g/dL — ABNORMAL LOW (ref 12.0–16.0)
MCH: 32.8 pg (ref 26.0–34.0)
MCHC: 35.2 g/dL (ref 32.0–36.0)
MCV: 93.2 fL (ref 80.0–100.0)
PLATELETS: 205 10*3/uL (ref 150–440)
RBC: 3.46 MIL/uL — AB (ref 3.80–5.20)
RDW: 12.5 % (ref 11.5–14.5)
WBC: 19.6 10*3/uL — AB (ref 3.6–11.0)

## 2015-11-28 LAB — OB RESULTS CONSOLE GC/CHLAMYDIA
CHLAMYDIA, DNA PROBE: NEGATIVE
GC PROBE AMP, GENITAL: NEGATIVE

## 2015-11-28 MED ORDER — LACTATED RINGERS IV SOLN
INTRAVENOUS | Status: DC
Start: 2015-11-28 — End: 2015-11-30
  Administered 2015-11-28 – 2015-11-29 (×3): via INTRAVENOUS

## 2015-11-28 MED ORDER — ZOLPIDEM TARTRATE 5 MG PO TABS
5.0000 mg | ORAL_TABLET | Freq: Every evening | ORAL | Status: DC | PRN
  Administered 2015-11-28: 5 mg via ORAL
  Filled 2015-11-28: qty 1

## 2015-11-28 MED ORDER — ONDANSETRON HCL 4 MG/2ML IJ SOLN
4.0000 mg | Freq: Four times a day (QID) | INTRAMUSCULAR | Status: DC | PRN
  Administered 2015-11-30: 4 mg via INTRAVENOUS
  Filled 2015-11-28: qty 2

## 2015-11-28 MED ORDER — OXYTOCIN BOLUS FROM INFUSION
500.0000 mL | INTRAVENOUS | Status: DC
  Administered 2015-11-30: 500 mL via INTRAVENOUS

## 2015-11-28 MED ORDER — TERBUTALINE SULFATE 1 MG/ML IJ SOLN: 0.2500 mg | Freq: Once | INTRAMUSCULAR | Status: DC | PRN

## 2015-11-28 MED ORDER — ACETAMINOPHEN 325 MG PO TABS: 650.0000 mg | ORAL_TABLET | ORAL | Status: DC | PRN

## 2015-11-28 MED ORDER — BUTORPHANOL TARTRATE 1 MG/ML IJ SOLN
2.0000 mg | INTRAMUSCULAR | Status: DC | PRN
  Administered 2015-11-29: 2 mg via INTRAVENOUS
  Filled 2015-11-28 (×2): qty 1

## 2015-11-28 MED ORDER — DINOPROSTONE 10 MG VA INST
10.0000 mg | VAGINAL_INSERT | Freq: Once | VAGINAL | Status: AC
  Administered 2015-11-28: 10 mg via VAGINAL
  Filled 2015-11-28: qty 1

## 2015-11-28 MED ORDER — LACTATED RINGERS IV SOLN
500.0000 mL | INTRAVENOUS | Status: DC | PRN
  Administered 2015-11-29: 500 mL via INTRAVENOUS

## 2015-11-28 MED ORDER — OXYTOCIN 40 UNITS IN LACTATED RINGERS INFUSION - SIMPLE MED
2.5000 [IU]/h | INTRAVENOUS | Status: DC
  Filled 2015-11-28: qty 1000

## 2015-11-28 NOTE — H&P (Signed)
See paper H&P   Pt presents for IOL for IUGR. EFW at 36 weeks with all measurements <3%.  Pt to receive Cervidil overnight d/t low Bishop score of 1.   Category 1 fetal tracing on admission.   PN Labs:  A+/RI/VNI/RPR NR/HepBneg/HIV neg/GC & CT neg/GBS neg

## 2015-11-29 ENCOUNTER — Inpatient Hospital Stay: Payer: Medicaid Other | Admitting: Certified Registered Nurse Anesthetist

## 2015-11-29 LAB — ABO/RH: ABO/RH(D): A POS

## 2015-11-29 MED ORDER — BUTORPHANOL TARTRATE 1 MG/ML IJ SOLN
1.0000 mg | INTRAMUSCULAR | Status: DC | PRN
  Administered 2015-11-29: 1 mg via INTRAVENOUS

## 2015-11-29 MED ORDER — OXYTOCIN 40 UNITS IN LACTATED RINGERS INFUSION - SIMPLE MED
1.0000 m[IU]/min | INTRAVENOUS | Status: DC
  Administered 2015-11-29: 1 m[IU]/min via INTRAVENOUS

## 2015-11-29 MED ORDER — FENTANYL 2.5 MCG/ML W/ROPIVACAINE 0.2% IN NS 100 ML EPIDURAL INFUSION (ARMC-ANES)
EPIDURAL | Status: AC
  Administered 2015-11-29: 9 mL/h via EPIDURAL
  Filled 2015-11-29: qty 100

## 2015-11-29 MED ORDER — BUPIVACAINE HCL (PF) 0.25 % IJ SOLN
INTRAMUSCULAR | Status: DC | PRN
  Administered 2015-11-29 (×2): 4 mL via EPIDURAL

## 2015-11-29 MED ORDER — SODIUM CHLORIDE FLUSH 0.9 % IV SOLN
INTRAVENOUS | Status: AC
  Administered 2015-11-29: 10 mL
  Filled 2015-11-29: qty 20

## 2015-11-29 MED ORDER — LIDOCAINE HCL (PF) 1 % IJ SOLN
INTRAMUSCULAR | Status: DC | PRN
  Administered 2015-11-29: 3 mL

## 2015-11-29 MED ORDER — LIDOCAINE-EPINEPHRINE (PF) 1.5 %-1:200000 IJ SOLN
INTRAMUSCULAR | Status: DC | PRN
  Administered 2015-11-29: 3 mL via PERINEURAL

## 2015-11-29 MED ORDER — TERBUTALINE SULFATE 1 MG/ML IJ SOLN: 0.2500 mg | Freq: Once | INTRAMUSCULAR | Status: DC | PRN

## 2015-11-29 MED ORDER — BUTORPHANOL TARTRATE 1 MG/ML IJ SOLN
INTRAMUSCULAR | Status: AC
  Administered 2015-11-29: 1 mg via INTRAVENOUS
  Filled 2015-11-29: qty 1

## 2015-11-29 NOTE — Anesthesia Preprocedure Evaluation (Signed)
Anesthesia Evaluation  Patient identified by MRN, date of birth, ID band Patient awake    Reviewed: Allergy & Precautions, NPO status , Patient's Chart, lab work & pertinent test results  History of Anesthesia Complications Negative for: history of anesthetic complications  Airway Mallampati: II  TM Distance: >3 FB Neck ROM: full    Dental  (+) Teeth Intact   Pulmonary Current Smoker,    Pulmonary exam normal        Cardiovascular Exercise Tolerance: Good negative cardio ROS   Rhythm:regular Rate:Normal     Neuro/Psych    GI/Hepatic Neg liver ROS, GERD  Medicated and Controlled,  Endo/Other  negative endocrine ROS  Renal/GU negative Renal ROS     Musculoskeletal   Abdominal   Peds  Hematology negative hematology ROS (+)   Anesthesia Other Findings   Reproductive/Obstetrics                             Anesthesia Physical Anesthesia Plan  ASA: II  Anesthesia Plan: Epidural   Post-op Pain Management:    Induction:   Airway Management Planned:   Additional Equipment:   Intra-op Plan:   Post-operative Plan:   Informed Consent: I have reviewed the patients History and Physical, chart, labs and discussed the procedure including the risks, benefits and alternatives for the proposed anesthesia with the patient or authorized representative who has indicated his/her understanding and acceptance.     Plan Discussed with: Anesthesiologist  Anesthesia Plan Comments:         Anesthesia Quick Evaluation

## 2015-11-29 NOTE — Progress Notes (Addendum)
L&D Note  11/29/2015 - 5:30 PM  20 y.o. G2P1001 [redacted]w[redacted]d (EDC 6-8, dated by 6wk u/s). Pregnancy complicated by severe FGR (<5%), BMI 35, tobacco abuse  [name] is admitted for IOL for severe FGR   Subjective:  Patient comfortable with epidural in place. She has many questions regarding the labor process and when to expect delivery.   Objective:   Filed Vitals:   11/29/15 1649 11/29/15 1654 11/29/15 1659 11/29/15 1704  BP:  111/69    Pulse:  69    Temp:      TempSrc:      Resp:  16    Height:      Weight:      SpO2: 98% 98% 98% 98%    Current Vital Signs 24h Vital Sign Ranges  T 98.1 F (36.7 C) Temp  Avg: 98.3 F (36.8 C)  Min: 97.8 F (36.6 C)  Max: 98.7 F (37.1 C)  BP 111/69 mmHg BP  Min: 101/54  Max: 140/78  HR 69 Pulse  Avg: 87.4  Min: 68  Max: 109  RR 16 Resp  Avg: 16  Min: 16  Max: 16  SaO2 98 % Not Delivered SpO2  Avg: 99.1 %  Min: 98 %  Max: 100 %       24 Hour I/O Current Shift I/O  Time Ins Outs 05/17 0701 - 05/18 0700 In: 1245 [P.O.:120; I.V.:1125] Out: -      FHR: 130 baseline, +accels, no decels, mod var. Some maternal artifact Toco: q1-31m with some couplets Gen: lying on her side comfortably SVE: deferred. 2/60/-3 per RN @ 1630  Labs:  No new labs  Medications Current Facility-Administered Medications  Medication Dose Route Frequency Provider Last Rate Last Dose  . acetaminophen (TYLENOL) tablet 650 mg  650 mg Oral Q4H PRN Marta Antu, CNM      . butorphanol (STADOL) injection 1 mg  1 mg Intravenous Q1H PRN Farrel Conners, CNM   1 mg at 11/29/15 0853  . lactated ringers infusion 500-1,000 mL  500-1,000 mL Intravenous PRN Marta Antu, CNM      . lactated ringers infusion   Intravenous Continuous Marta Antu, CNM 125 mL/hr at 11/29/15 1603    . ondansetron (ZOFRAN) injection 4 mg  4 mg Intravenous Q6H PRN Marta Antu, CNM      . oxytocin (PITOCIN) IV BOLUS FROM BAG  500 mL Intravenous Continuous Marta Antu, CNM       . oxytocin (PITOCIN) IV infusion 40 units in LR 1000 mL - Premix  2.5 Units/hr Intravenous Continuous Marta Antu, CNM   2.5 Units/hr at 11/29/15 1126  . oxytocin (PITOCIN) IV infusion 40 units in LR 1000 mL - Premix  1-40 milli-units/min Intravenous Titrated Farrel Conners, CNM 27 mL/hr at 11/29/15 1705 18 milli-units/min at 11/29/15 1705  . terbutaline (BRETHINE) injection 0.25 mg  0.25 mg Subcutaneous Once PRN Farrel Conners, CNM      . zolpidem (AMBIEN) tablet 5 mg  5 mg Oral QHS PRN Marta Antu, CNM   5 mg at 11/28/15 2306   Facility-Administered Medications Ordered in Other Encounters  Medication Dose Route Frequency Provider Last Rate Last Dose  . bupivacaine (PF) (MARCAINE) 0.25 % injection    Anesthesia Intra-op Ginger Carne, CRNA   4 mL at 11/29/15 1512  . lidocaine (PF) (XYLOCAINE) 1 % injection    Anesthesia Intra-op Ginger Carne, CRNA   3 mL at 11/29/15 1459  . lidocaine-EPINEPHrine 1.5 %-1:200000 injection  Anesthesia Intra-op Ginger CarneStephanie Michelet, CRNA   3 mL at 11/29/15 1505    Assessment & Plan:  Pt stable *IUP: category I tracing with accels, fetal status reassuring *IOL: long d/w the patient regarding the labor process and the possible long latent phase of labor. I told her that it's hard to predict but her uterus is contracting well, fetus is tolerating it and she's comfortable. I told her that if she feels anymore pain or pressure to let us know but if not, I'll come by in a 3-4 hours to check her and if possible, to AROM her, which she was amenable to. Continue pit per protocol *FGR: placenta to pathology. Fetus tolerating labor very well *GBS neg *Analgesia: pt comfortable with epidural  Cornelia Copaharlie Katana Berthold, Jr MD [facility] Pager [phone]

## 2015-11-29 NOTE — Anesthesia Procedure Notes (Signed)
Epidural Patient location during procedure: OB Start time: 11/29/2015 2:49 PM End time: 11/29/2015 3:07 PM  Staffing Anesthesiologist: Yves DillARROLL, PAUL Resident/CRNA: Ginger CarneMICHELET, Anees Vanecek Performed by: anesthesiologist and resident/CRNA   Preanesthetic Checklist Completed: patient identified, site marked, surgical consent, pre-op evaluation, timeout performed, IV checked, risks and benefits discussed and monitors and equipment checked  Epidural Patient position: sitting Prep: Betadine Patient monitoring: heart rate, continuous pulse ox and blood pressure Approach: midline Location: L4-L5 Injection technique: LOR saline  Needle:  Needle type: Tuohy  Needle gauge: 17 G Needle length: 9 cm and 9 Needle insertion depth: 8 cm Catheter type: closed end flexible Catheter size: 19 Gauge Catheter at skin depth: 9 cm Test dose: negative and 1.5% lidocaine with Epi 1:200 K  Assessment Sensory level: T8 Events: blood not aspirated, injection not painful, no injection resistance, negative IV test and no paresthesia  Additional Notes   Patient tolerated the insertion well without immediate complications.Reason for block:procedure for pain

## 2015-11-29 NOTE — Progress Notes (Signed)
OB Note  Category I tracing, contracting regularly q2-3264m on 18 of pitocin. AF VS normal and stable. 2-3/50/-1, well applied-->AROM for clear fluid  Pt doing well Continue pitocin and current plan of care  Cornelia Copaharlie Marah Park, Jr MD [facility]  Pager: [phone]

## 2015-11-29 NOTE — Progress Notes (Addendum)
L&D Progress Note  S: Feels better after shower  O: BP 114/54 mmHg  Pulse 70  Temp(Src) 98.5 F (36.9 C) (Oral)  Resp 16  Ht 4\' 11"  (1.499 m)  Wt 77.111 kg (170 lb)  BMI 34.32 kg/m2  LMP 03/06/2015  Cervidil removed at 1000. Cervix 1.5/70%/-1 General: appears more relaxed FHR: 135 with moderate variability Toco: contractions q2-4 min x50 sec, palpate mild  A: IOL for FGR  P: Start Pitocin Epidural when she becomes more active  Brooke Aguirre, CNM

## 2015-11-29 NOTE — Progress Notes (Signed)
L&D Note  11/29/2015 - 1:15 PM  20 y.o. G2P1001 [redacted]w[redacted]d (EDC 6-8, dated by 6wk u/s). Pregnancy complicated by severe FGR (<5%), BMI 35, tobacco abuse  Ms. MARNA WENIGER is admitted for IOL for severe FGR   Subjective:  Patient uncomfortable with contractions and tearful. She states that she wants a c-section because she was advised by Dr. Jean Rosenthal that "the baby needed to come out today."  Objective:   Filed Vitals:   11/29/15 0935 11/29/15 1042 11/29/15 1130 11/29/15 1233  BP: 126/64 114/54 125/67 128/76  Pulse: 85 70 87 88  Temp:   97.8 F (36.6 C)   TempSrc:   Oral   Resp:   16   Height:      Weight:        Current Vital Signs 24h Vital Sign Ranges  T 97.8 F (36.6 C) Temp  Avg: 98.4 F (36.9 C)  Min: 97.8 F (36.6 C)  Max: 98.7 F (37.1 C)  BP 128/76 mmHg BP  Min: 102/53  Max: 130/63  HR 88 Pulse  Avg: 91.4  Min: 70  Max: 109  RR 16 Resp  Avg: 16  Min: 16  Max: 16  SaO2   Not Delivered No Data Recorded       24 Hour I/O Current Shift I/O  Time Ins Outs 05/17 0701 - 05/18 0700 In: 1245 [P.O.:120; I.V.:1125] Out: -      FHR: 135 baseline, +accels, no decels, mod var. Some maternal artifact Toco: q2-48m Gen: lying in bed on her back and tearful SVE: deferred  Labs:   Recent Labs Lab 11/28/15 2019  WBC 19.6*  HGB 11.4*  HCT 32.2*  PLT 205   A POS  Medications Current Facility-Administered Medications  Medication Dose Route Frequency Provider Last Rate Last Dose  . acetaminophen (TYLENOL) tablet 650 mg  650 mg Oral Q4H PRN Marta Antu, CNM      . butorphanol (STADOL) injection 1 mg  1 mg Intravenous Q1H PRN Farrel Conners, CNM   1 mg at 11/29/15 0853  . lactated ringers infusion 500-1,000 mL  500-1,000 mL Intravenous PRN Marta Antu, CNM      . lactated ringers infusion   Intravenous Continuous Marta Antu, CNM 125 mL/hr at 11/29/15 1126    . ondansetron (ZOFRAN) injection 4 mg  4 mg Intravenous Q6H PRN Marta Antu, CNM      . oxytocin  (PITOCIN) IV BOLUS FROM BAG  500 mL Intravenous Continuous Marta Antu, CNM      . oxytocin (PITOCIN) IV infusion 40 units in LR 1000 mL - Premix  2.5 Units/hr Intravenous Continuous Marta Antu, CNM   2.5 Units/hr at 11/29/15 1126  . oxytocin (PITOCIN) IV infusion 40 units in LR 1000 mL - Premix  1-40 milli-units/min Intravenous Titrated Farrel Conners, CNM 9 mL/hr at 11/29/15 1307 6 milli-units/min at 11/29/15 1307  . terbutaline (BRETHINE) injection 0.25 mg  0.25 mg Subcutaneous Once PRN Farrel Conners, CNM      . zolpidem (AMBIEN) tablet 5 mg  5 mg Oral QHS PRN Marta Antu, CNM   5 mg at 11/28/15 2306    Assessment & Plan:  Pt stable *IUP: category I tracing with accels, fetal status reassuring *IOL: I d/w her that what was meant by Dr. Jean Rosenthal was that the patient needed to be on a path towards delivery at 37wks, given the FGR fetus. s/p cervidil which was removed at 10am today. Currently on pitocin 6 mU. Continue to titrate per  protocol *FGR: placenta to pathology. Fetus tolerating labor very well *GBS neg *Analgesia: long d/w that contractions are a normal part of the labor process.  Large component of it seems mental and I told her that she is in the worst position for dealing with labor and that is lying flat on her back. I encouraged to use the birthing ball and the wireless monitors and ambulate in the hall and that it'd be best for her to get further into labor before doing an epidural. I told her that the safest route for both her and baby, right now, is to continue with plans for a vaginal delivery. Pt amenable to plan  Cornelia Copaharlie Brysyn Brandenberger, Jr MD [facility] Pager [phone]

## 2015-11-29 NOTE — Progress Notes (Addendum)
L&D Progress Note  IOL for IUGR at 37 weeks. EFW 1 week ago was 4#9 oz with normal AFI. GBS final result not seen in chart  S: Contractions have kept her up all night. Received one dose of Stadol, which relieved pain x 30 min.  O: General : tearful BP 130/63 mmHg  Pulse 94  Temp(Src) 98.5 F (36.9 C) (Oral)  Resp 16  Ht 4\' 11"  (1.499 m)  Wt 77.111 kg (170 lb)  BMI 34.32 kg/m2  LMP 03/06/2015  FHR: baseline 135 with accelerations to 150s, moderate variability Toco: Contractions q 2 min apart Cervix: Cervidil intact. 1.5/60%/-1/vtx  A/P IUP at 37 weeks for IOL for FGR-in early labor-with some cervical change  Remove Cervidil at 1000 FWB: Cat1 tracing GBS: get final GBS results  Brooke Aguirre, CNM  GBS negative  Brooke Aguirre, CNM

## 2015-11-30 LAB — RPR: RPR: NONREACTIVE

## 2015-11-30 MED ORDER — DOCUSATE SODIUM 100 MG PO CAPS: 100.0000 mg | ORAL_CAPSULE | Freq: Two times a day (BID) | ORAL | Status: DC | PRN

## 2015-11-30 MED ORDER — OXYTOCIN 40 UNITS IN LACTATED RINGERS INFUSION - SIMPLE MED: 2.5000 [IU]/h | INTRAVENOUS | Status: DC | PRN

## 2015-11-30 MED ORDER — FENTANYL 2.5 MCG/ML W/ROPIVACAINE 0.2% IN NS 100 ML EPIDURAL INFUSION (ARMC-ANES)
EPIDURAL | Status: AC
  Administered 2015-11-29: 250 ug
  Filled 2015-11-30: qty 100

## 2015-11-30 MED ORDER — DIBUCAINE 1 % RE OINT: 1.0000 "application " | TOPICAL_OINTMENT | RECTAL | Status: DC | PRN

## 2015-11-30 MED ORDER — COCONUT OIL OIL: 1.0000 "application " | TOPICAL_OIL | Status: DC | PRN

## 2015-11-30 MED ORDER — AMMONIA AROMATIC IN INHA
RESPIRATORY_TRACT | Status: AC
  Filled 2015-11-30: qty 10

## 2015-11-30 MED ORDER — MISOPROSTOL 200 MCG PO TABS
ORAL_TABLET | ORAL | Status: AC
  Filled 2015-11-30: qty 1

## 2015-11-30 MED ORDER — SIMETHICONE 80 MG PO CHEW: 80.0000 mg | CHEWABLE_TABLET | ORAL | Status: DC | PRN

## 2015-11-30 MED ORDER — MAGNESIUM HYDROXIDE 400 MG/5ML PO SUSP: 30.0000 mL | ORAL | Status: DC | PRN

## 2015-11-30 MED ORDER — ACETAMINOPHEN 325 MG PO TABS: 650.0000 mg | ORAL_TABLET | ORAL | Status: DC | PRN

## 2015-11-30 MED ORDER — IBUPROFEN 600 MG PO TABS
600.0000 mg | ORAL_TABLET | Freq: Four times a day (QID) | ORAL | Status: DC
  Administered 2015-11-30 (×2): 600 mg via ORAL
  Filled 2015-11-30 (×4): qty 1

## 2015-11-30 MED ORDER — ONDANSETRON HCL 4 MG PO TABS
4.0000 mg | ORAL_TABLET | ORAL | Status: DC | PRN
Start: 2015-11-30 — End: 2015-12-01

## 2015-11-30 MED ORDER — ONDANSETRON HCL 4 MG/2ML IJ SOLN: 4.0000 mg | INTRAMUSCULAR | Status: DC | PRN

## 2015-11-30 MED ORDER — DIPHENHYDRAMINE HCL 25 MG PO CAPS: 25.0000 mg | ORAL_CAPSULE | Freq: Four times a day (QID) | ORAL | Status: DC | PRN

## 2015-11-30 MED ORDER — NICOTINE 14 MG/24HR TD PT24
14.0000 mg | MEDICATED_PATCH | Freq: Every day | TRANSDERMAL | Status: DC
  Administered 2015-11-30: 14 mg via TRANSDERMAL
  Filled 2015-11-30: qty 1

## 2015-11-30 MED ORDER — OXYTOCIN 10 UNIT/ML IJ SOLN
INTRAMUSCULAR | Status: AC
  Filled 2015-11-30: qty 2

## 2015-11-30 MED ORDER — BENZOCAINE-MENTHOL 20-0.5 % EX AERO
1.0000 "application " | INHALATION_SPRAY | CUTANEOUS | Status: DC | PRN
  Filled 2015-11-30: qty 56

## 2015-11-30 MED ORDER — PRENATAL MULTIVITAMIN CH
1.0000 | ORAL_TABLET | Freq: Every day | ORAL | Status: DC
  Administered 2015-11-30 – 2015-12-01 (×2): 1 via ORAL
  Filled 2015-11-30 (×3): qty 1

## 2015-11-30 MED ORDER — OXYCODONE HCL 5 MG PO TABS: 5.0000 mg | ORAL_TABLET | ORAL | Status: DC | PRN

## 2015-11-30 MED ORDER — WITCH HAZEL-GLYCERIN EX PADS: 1.0000 "application " | MEDICATED_PAD | CUTANEOUS | Status: DC | PRN

## 2015-11-30 MED ORDER — TAB-A-VITE/IRON PO TABS: 1.0000 | ORAL_TABLET | Freq: Every day | ORAL | Status: DC

## 2015-11-30 MED ORDER — LIDOCAINE HCL (PF) 1 % IJ SOLN
INTRAMUSCULAR | Status: AC
  Filled 2015-11-30: qty 30

## 2015-11-30 NOTE — Discharge Summary (Signed)
Obstetrical Discharge Summary  Date of Admission: 11/28/2015 Date of Discharge: 12/01/2015  Primary OB: [facility]  Gestational Age at Delivery: 812w1d   Antepartum complications: severe FGR (<5%), BMI 35, tobacco abuse Reason for Admission: IOL for severe FGR Date of Delivery: 11/30/2015  Delivered By: Cornelia Copaharlie Pickens, Jr MD Delivery Type: spontaneous vaginal delivery Intrapartum complications/course: None Anesthesia: epidural Placenta: Delivered and expressed via active management. Intact: yes. To pathology: yes.  Laceration: periurethral 1 o'clock, repaired Episiotomy: none EBL: 200mL Baby: Liveborn female, APGARs 8/9, weight 2240 g.   Discharge Diagnosis: Delivered. Fetal growth restriction.  Obesity with BMI 35.    Postpartum course: Infant and mother have done well.    Discharge Vital Signs:  Current Vital Signs 24h Vital Sign Ranges  T 98.7 F (37.1 C) Temp  Avg: 98.3 F (36.8 C)  Min: 98 F (36.7 C)  Max: 98.7 F (37.1 C)  BP 110/74 mmHg BP  Min: 110/74  Max: 128/65  HR 65 Pulse  Avg: 63.4  Min: 57  Max: 68  RR 20 Resp  Avg: 18.4  Min: 18  Max: 20  SaO2 98 % Not Delivered SpO2  Avg: 98.5 %  Min: 98 %  Max: 99 %       24 Hour I/O Current Shift I/O  Time Ins Outs 05/19 0701 - 05/20 0700 In: 120 [P.O.:120] Out: 2900 [Urine:2900]     Discharge Exam:  NAD Perineum: intact, healing from periurethral repair of laceration Abdomen: firm fundus below the umbilicus. RRR no MRGs CTAB Ext: no c/c/e  Disposition: Home  Rh Immune globulin given: not applicable Rubella vaccine given: not applicable Tdap vaccine given in AP or PP setting: yes  Contraception: Plans IUD (paraguard)  Prenatal/Postnatal Panel: A POS//Rubella Immune//Varicella Not immune//RPR negative//HIV negative/HepB Surface Ag negative//pap not applicable//plans to bottle feed  Plan:  Brooke Aguirre was discharged to home in good condition. [facility] for a  post partum visit  Discharge Medications:   Medication List    STOP taking these medications        ondansetron 4 MG disintegrating tablet  Commonly known as:  ZOFRAN ODT     oxyCODONE-acetaminophen 5-325 MG tablet  Commonly known as:  PERCOCET     promethazine 25 MG tablet  Commonly known as:  PHENERGAN      Take PRENATAL VITAMIN   Annamarie MajorPaul Margaret Staggs, MD

## 2015-11-30 NOTE — Discharge Instructions (Addendum)
° °Vaginal Delivery, Care After °Refer to this sheet in the next few weeks. These discharge instructions provide you with information on caring for yourself after delivery. Your caregiver may also give you specific instructions. Your treatment has been planned according to the most current medical practices available, but problems sometimes occur. Call your caregiver if you have any problems or questions after you go home. °HOME CARE INSTRUCTIONS °1. Take over-the-counter or prescription medicines only as directed by your caregiver or pharmacist. °2. Do not drink alcohol, especially if you are breastfeeding or taking medicine to relieve pain. °3. Do not smoke tobacco. °4. Continue to use good perineal care. Good perineal care includes: °1. Wiping your perineum from back to front °2. Keeping your perineum clean. °3. You can do sitz baths twice a day, to help keep this area clean °5. Do not use tampons, douche or have sex until your caregiver says it is okay. °6. Shower only and avoid sitting in submerged water, aside from sitz baths °7. Wear a well-fitting bra that provides breast support. °8. Eat healthy foods. °9. Drink enough fluids to keep your urine clear or pale yellow. °10. Eat high-fiber foods such as whole grain cereals and breads, brown rice, beans, and fresh fruits and vegetables every day. These foods may help prevent or relieve constipation. °11. Avoid constipation with high fiber foods or medications, such as miralax or metamucil °12. Follow your caregiver's recommendations regarding resumption of activities such as climbing stairs, driving, lifting, exercising, or traveling. °13. Talk to your caregiver about resuming sexual activities. Resumption of sexual activities is dependent upon your risk of infection, your rate of healing, and your comfort and desire to resume sexual activity. °14. Try to have someone help you with your household activities and your newborn for at least a few days after you  leave the hospital. °15. Rest as much as possible. Try to rest or take a nap when your newborn is sleeping. °16. Increase your activities gradually. °17. Keep all of your scheduled postpartum appointments. It is very important to keep your scheduled follow-up appointments. At these appointments, your caregiver will be checking to make sure that you are healing physically and emotionally. °SEEK MEDICAL CARE IF:  °· You are passing large clots from your vagina. Save any clots to show your caregiver. °· You have a foul smelling discharge from your vagina. °· You have trouble urinating. °· You are urinating frequently. °· You have pain when you urinate. °· You have a change in your bowel movements. °· You have increasing redness, pain, or swelling near your vaginal incision (episiotomy) or vaginal tear. °· You have pus draining from your episiotomy or vaginal tear. °· Your episiotomy or vaginal tear is separating. °· You have painful, hard, or reddened breasts. °· You have a severe headache. °· You have blurred vision or see spots. °· You feel sad or depressed. °· You have thoughts of hurting yourself or your newborn. °· You have questions about your care, the care of your newborn, or medicines. °· You are dizzy or light-headed. °· You have a rash. °· You have nausea or vomiting. °· You were breastfeeding and have not had a menstrual period within 12 weeks after you stopped breastfeeding. °· You are not breastfeeding and have not had a menstrual period by the 12th week after delivery. °· You have a fever. °SEEK IMMEDIATE MEDICAL CARE IF:  °· You have persistent pain. °· You have chest pain. °· You have shortness of breath. °·   You faint. °· You have leg pain. °· You have stomach pain. °· Your vaginal bleeding saturates two or more sanitary pads in 1 hour. °MAKE SURE YOU:  °· Understand these instructions. °· Will watch your condition. °· Will get help right away if you are not doing well or get worse. °Document Released:  06/27/2000 Document Revised: 11/14/2013 Document Reviewed: 02/25/2012 °ExitCare® Patient Information ©2015 [employer]. This information is not intended to replace advice given to you by your health care provider. Make sure you discuss any questions you have with your health care provider. ° °Sitz Bath °A sitz bath is a warm water bath taken in the sitting position. The water covers only the hips and butt (buttocks). We recommend using one that fits in the toilet, to help with ease of use and cleanliness. It may be used for either healing or cleaning purposes. Sitz baths are also used to relieve pain, itching, or muscle tightening (spasms). The water may contain medicine. Moist heat will help you heal and relax.  °HOME CARE  °Take 3 to 4 sitz baths a day. °18. Fill the bathtub half-full with warm water. °19. Sit in the water and open the drain a little. °20. Turn on the warm water to keep the tub half-full. Keep the water running constantly. °21. Soak in the water for 15 to 20 minutes. °22. After the sitz bath, pat the affected area dry. °GET HELP RIGHT AWAY IF: °You get worse instead of better. Stop the sitz baths if you get worse. °MAKE SURE YOU: °· Understand these instructions. °· Will watch your condition. °· Will get help right away if you are not doing well or get worse. °Document Released: 08/07/2004 Document Revised: 03/24/2012 Document Reviewed: 10/28/2010 °ExitCare® Patient Information ©2015 [employer]. This information is not intended to replace advice given to you by your health care provider. Make sure you discuss any questions you have with your health care provider. ° °Call your doctor for increased pain or vaginal bleeding, temperature above 100.4, depression, or concerns.  No strenuous activity or heavy lifting for 6 weeks.  No intercourse, tampons, douching, or enemas for 6 weeks.  No tub baths-showers only.  No driving for 2 weeks or while taking pain medications.  Continue prenatal vitamin  and iron.   °

## 2015-12-01 LAB — HEMOGLOBIN: HEMOGLOBIN: 11 g/dL — AB (ref 12.0–16.0)

## 2015-12-01 MED ORDER — VARICELLA VIRUS VACCINE LIVE 1350 PFU/0.5ML IJ SUSR
0.5000 mL | Freq: Once | INTRAMUSCULAR | Status: AC
  Administered 2015-12-01: 0.5 mL via SUBCUTANEOUS
  Filled 2015-12-01: qty 0.5

## 2015-12-01 NOTE — Anesthesia Post-op Follow-up Note (Signed)
  Anesthesia Pain Follow-up Note  Patient: Brooke HarkJessie L Mowers  Day #: 1  Date of Follow-up: 12/01/2015 Time: 12:47 PM  Last Vitals:  Filed Vitals:   12/01/15 0541 12/01/15 0801  BP:  110/74  Pulse:  65  Temp: 36.9 C 37.1 C  Resp:  20    Level of Consciousness: alert  Pain: mild   Side Effects:None  Catheter Site Exam:clean, dry, no drainage  Plan: D/C from anesthesia care  Cleda MccreedyJoseph K Darl Brisbin

## 2015-12-01 NOTE — Progress Notes (Signed)
Discharge instructions provided.  Pt and sig other verbalize understanding of all instructions and follow-up care.  Pt discharged to home with infant at 1320 on 12/01/15 via wheelchair by RN. Reynold BowenSusan Paisley Barkley Kratochvil, RN 12/01/2015 1:37 PM

## 2015-12-01 NOTE — Progress Notes (Signed)
Admit Date: 11/28/2015 Today's Date: 12/01/2015  Post Partum Day 1  Subjective:  no complaints  Objective: Temp:  [98 F (36.7 C)-98.7 F (37.1 C)] 98.7 F (37.1 C) (05/20 0801) Pulse Rate:  [57-68] 65 (05/20 0801) Resp:  [18-20] 20 (05/20 0801) BP: (110-128)/(65-74) 110/74 mmHg (05/20 0801) SpO2:  [98 %-99 %] 98 % (05/20 0025)  Physical Exam:  General: alert, cooperative and no distress Lochia: appropriate Uterine Fundus: firm Incision: none DVT Evaluation: No evidence of DVT seen on physical exam.   Recent Labs  11/28/15 2019 12/01/15 0951  HGB 11.4* 11.0*  HCT 32.2*  --     Assessment/Plan: Discharge home, Bottle Feeding and Infant doing well   LOS: 3 days   Brooke Aguirre Luvenia Cranford [facility] 12/01/2015, 10:57 AM

## 2015-12-01 NOTE — Anesthesia Postprocedure Evaluation (Signed)
Anesthesia Post Note  Patient: Brooke Aguirre  Procedure(s) Performed: * No procedures listed *  Patient location during evaluation: Mother Baby Anesthesia Type: Epidural Level of consciousness: awake and alert Pain management: pain level controlled Vital Signs Assessment: post-procedure vital signs reviewed and stable Respiratory status: spontaneous breathing, nonlabored ventilation and respiratory function stable Cardiovascular status: stable Postop Assessment: no headache, no backache and epidural receding Anesthetic complications: no    Last Vitals:  Filed Vitals:   12/01/15 0541 12/01/15 0801  BP:  110/74  Pulse:  65  Temp: 36.9 C 37.1 C  Resp:  20    Last Pain:  Filed Vitals:   12/01/15 0914  PainSc: 0-No pain                 Cleda MccreedyJoseph K Arika Mainer

## 2015-12-03 LAB — SURGICAL PATHOLOGY

## 2015-12-05 ENCOUNTER — Other Ambulatory Visit: Payer: Self-pay | Admitting: Obstetrics and Gynecology

## 2015-12-05 DIAGNOSIS — R600 Localized edema: Secondary | ICD-10-CM

## 2015-12-06 ENCOUNTER — Ambulatory Visit
Admission: RE | Admit: 2015-12-06 | Discharge: 2015-12-06 | Disposition: A | Discharge status: Home / Self Care | Payer: Medicaid Other | Source: Ambulatory Visit | Attending: Obstetrics and Gynecology | Admitting: Obstetrics and Gynecology

## 2015-12-06 DIAGNOSIS — R600 Localized edema: Secondary | ICD-10-CM | POA: Insufficient documentation

## 2015-12-17 ENCOUNTER — Emergency Department
Admission: EM | Admit: 2015-12-17 | Discharge: 2015-12-17 | Disposition: A | Discharge status: Home / Self Care | Payer: Medicaid Other | Attending: Emergency Medicine | Admitting: Emergency Medicine

## 2015-12-17 ENCOUNTER — Emergency Department: Payer: Medicaid Other

## 2015-12-17 DIAGNOSIS — K5900 Constipation, unspecified: Secondary | ICD-10-CM | POA: Diagnosis not present

## 2015-12-17 DIAGNOSIS — E049 Nontoxic goiter, unspecified: Secondary | ICD-10-CM | POA: Insufficient documentation

## 2015-12-17 DIAGNOSIS — R079 Chest pain, unspecified: Secondary | ICD-10-CM | POA: Diagnosis not present

## 2015-12-17 DIAGNOSIS — N189 Chronic kidney disease, unspecified: Secondary | ICD-10-CM | POA: Insufficient documentation

## 2015-12-17 DIAGNOSIS — Z79899 Other long term (current) drug therapy: Secondary | ICD-10-CM | POA: Insufficient documentation

## 2015-12-17 DIAGNOSIS — R1012 Left upper quadrant pain: Secondary | ICD-10-CM

## 2015-12-17 DIAGNOSIS — F17210 Nicotine dependence, cigarettes, uncomplicated: Secondary | ICD-10-CM | POA: Insufficient documentation

## 2015-12-17 LAB — CBC
HCT: 40.9 % (ref 35.0–47.0)
HEMOGLOBIN: 14 g/dL (ref 12.0–16.0)
MCH: 31.7 pg (ref 26.0–34.0)
MCHC: 34.3 g/dL (ref 32.0–36.0)
MCV: 92.4 fL (ref 80.0–100.0)
Platelets: 218 10*3/uL (ref 150–440)
RBC: 4.43 MIL/uL (ref 3.80–5.20)
RDW: 12.3 % (ref 11.5–14.5)
WBC: 11.8 10*3/uL — ABNORMAL HIGH (ref 3.6–11.0)

## 2015-12-17 LAB — COMPREHENSIVE METABOLIC PANEL
ALBUMIN: 4 g/dL (ref 3.5–5.0)
ALK PHOS: 85 U/L (ref 38–126)
ALT: 21 U/L (ref 14–54)
ANION GAP: 8 (ref 5–15)
AST: 18 U/L (ref 15–41)
BUN: 11 mg/dL (ref 6–20)
CALCIUM: 9.1 mg/dL (ref 8.9–10.3)
CHLORIDE: 108 mmol/L (ref 101–111)
CO2: 22 mmol/L (ref 22–32)
Creatinine, Ser: 0.73 mg/dL (ref 0.44–1.00)
GFR calc Af Amer: >60 mL/min (ref >60)
GFR calc non Af Amer: >60 mL/min (ref >60)
GLUCOSE: 114 mg/dL — AB (ref 65–99)
Potassium: 3.8 mmol/L (ref 3.5–5.1)
SODIUM: 138 mmol/L (ref 135–145)
Total Bilirubin: 0.7 mg/dL (ref 0.3–1.2)
Total Protein: 6.6 g/dL (ref 6.5–8.1)

## 2015-12-17 LAB — URINALYSIS COMPLETE WITH MICROSCOPIC (ARMC ONLY)
Bilirubin Urine: NEGATIVE
GLUCOSE, UA: NEGATIVE mg/dL
Ketones, ur: NEGATIVE mg/dL
Leukocytes, UA: NEGATIVE
Nitrite: NEGATIVE
PROTEIN: NEGATIVE mg/dL
SPECIFIC GRAVITY, URINE: 1.005 (ref 1.005–1.030)
pH: 6 (ref 5.0–8.0)

## 2015-12-17 LAB — PREGNANCY, URINE: PREG TEST UR: NEGATIVE

## 2015-12-17 LAB — LIPASE, BLOOD: Lipase: 23 U/L (ref 11–51)

## 2015-12-17 LAB — FIBRIN DERIVATIVES D-DIMER (ARMC ONLY): FIBRIN DERIVATIVES D-DIMER (ARMC): 407 (ref 0–499)

## 2015-12-17 MED ORDER — ONDANSETRON HCL 4 MG/2ML IJ SOLN
INTRAMUSCULAR | Status: AC
  Filled 2015-12-17: qty 2

## 2015-12-17 MED ORDER — POLYETHYLENE GLYCOL 3350 17 G PO PACK: 17.0000 g | PACK | Freq: Every day | ORAL | Status: DC

## 2015-12-17 MED ORDER — MORPHINE SULFATE (PF) 4 MG/ML IV SOLN
INTRAVENOUS | Status: AC
  Filled 2015-12-17: qty 1

## 2015-12-17 MED ORDER — GI COCKTAIL ~~LOC~~
30.0000 mL | Freq: Once | ORAL | Status: AC
  Administered 2015-12-17: 30 mL via ORAL
  Filled 2015-12-17: qty 30

## 2015-12-17 MED ORDER — IOPAMIDOL (ISOVUE-370) INJECTION 76%
100.0000 mL | Freq: Once | INTRAVENOUS | Status: AC | PRN
  Administered 2015-12-17: 100 mL via INTRAVENOUS

## 2015-12-17 MED ORDER — TRAMADOL HCL 50 MG PO TABS: 50.0000 mg | ORAL_TABLET | Freq: Four times a day (QID) | ORAL | Status: DC | PRN

## 2015-12-17 MED ORDER — DIATRIZOATE MEGLUMINE & SODIUM 66-10 % PO SOLN
15.0000 mL | Freq: Once | ORAL | Status: AC
  Administered 2015-12-17: 15 mL via ORAL

## 2015-12-17 MED ORDER — ONDANSETRON HCL 4 MG/2ML IJ SOLN
4.0000 mg | Freq: Once | INTRAMUSCULAR | Status: AC
  Administered 2015-12-17: 4 mg via INTRAVENOUS

## 2015-12-17 MED ORDER — MORPHINE SULFATE (PF) 4 MG/ML IV SOLN
4.0000 mg | Freq: Once | INTRAVENOUS | Status: AC
  Administered 2015-12-17: 4 mg via INTRAVENOUS

## 2015-12-17 MED ORDER — MORPHINE SULFATE (PF) 4 MG/ML IV SOLN
INTRAVENOUS | Status: AC
  Administered 2015-12-17: 4 mg via INTRAVENOUS
  Filled 2015-12-17: qty 1

## 2015-12-17 NOTE — ED Notes (Signed)
Pt resting in darkened room , no distress noted.

## 2015-12-17 NOTE — ED Notes (Signed)
Pt finished oral contrast, CT notified.. Pt c/o pain returning, MD notified..Marland Kitchen

## 2015-12-17 NOTE — ED Provider Notes (Signed)
Patient reports feeling significantly better. CT scan reviewed with radiology and Dr. Elesa MassedWard of gynecology. Significant stool burden noted but otherwise no abnormalities. Patient has no dysuria and a normal urinalysis. Dr. Elesa MassedWard recommends MiraLAX and outpatient follow-up. Discussed this with the patient she agrees and is to return to emergency Department if any worsening pain.  Brooke Everyobert Mattison Stuckey, MD 12/17/15 [phone]

## 2015-12-17 NOTE — Discharge Instructions (Signed)

## 2015-12-17 NOTE — ED Notes (Signed)
Per patient, [facility] for epigastric pain radiating to upper back.  Pt had baby 2 weeks ago.  Was induced at 37 weeks due to placental issue.  Pt reports that urgent care wanted to do CT, but pt refused at that time.  Pt wanted to see primary care doctor today.  Pt states that she has some shortness of breath and pain increases with breathing.  Pt states that she does currently smoke.  No other health issues noted.

## 2015-12-17 NOTE — ED Notes (Signed)
Resting in stretcher , darkened room, watching TV

## 2015-12-17 NOTE — ED Provider Notes (Signed)
[facility] Emergency Department Provider Note   ____________________________________________  Time seen: Approximately 6:59 AM  I have reviewed the triage vital signs and the nursing notes.   HISTORY  Chief Complaint Abdominal Pain    HPI Brooke Aguirre is a 20 y.o. female who comes into the hospital today with some left sided chest pain. She reports that she had the pain under her ribs that started around Saturday afternoon. She reports that she took some Rolaids and then Phenergan thinking it might be due to gastritis. The patient reports that she then spent the rest of the night up and in pain. She said she was in and out of the bathtub as the hot water helped but when she laid on heating pad and didn't make the pain any better. The patient reports that she went to [facility] yesterday morning and was told she had blood in her urine and she needs to come to the hospital to obtain a scan of her chest and abdomen. She reports that she was going to follow-up with her primary care physician so went home. She reports that the pain had eased off and she was doing well but the pain then returned. She reports it is left-sided pain goes around her back. She's never had this pain before. The patient denies any fevers and sweats. The patient has had some nausea as well as diarrhea. She reports that her lochia has stopped.   Past Medical History  Diagnosis Date  . Heart murmur   . Chronic kidney disease     CHRONIC  RIGHT PYELONEPHRITIS  . Family history of adverse reaction to anesthesia     PTS MOM WENT INTO [facility]A AFTER HYSTERECTOMY  . Thyroid goiter     Patient Active Problem List   Diagnosis Date Noted  . IUGR (intrauterine growth restriction) 11/28/2015    Past Surgical History  Procedure Laterality Date  . Removal of drug delivery implant Left 12/15/2014    Procedure: REMOVAL OF DRUG DELIVERY IMPLANT;  Surgeon: Nadara Mustardobert P Harris, MD;  Location: [facility] ORS;   Service: Gynecology;  Laterality: Left;    No current outpatient prescriptions on file.  Allergies Kiwi extract; Cranberry; Penicillins; and Sulfa antibiotics  No family history on file.  Social History Social History  Substance Use Topics  . Smoking status: Current Every Day Smoker -- 0.50 packs/day for 5 years    Types: Cigarettes  . Smokeless tobacco: Never Used  . Alcohol Use: No    Review of Systems Constitutional: No fever/chills Eyes: No visual changes. ENT: No sore throat. Cardiovascular:  chest pain. Respiratory: Denies shortness of breath. Gastrointestinal: Nausea and diarrhea, No abdominal pain.    No constipation. Genitourinary: Negative for dysuria. Musculoskeletal: Negative for back pain. Skin: Negative for rash. Neurological: Negative for headaches, focal weakness or numbness.  10-point ROS otherwise negative.  ____________________________________________   PHYSICAL EXAM:  VITAL SIGNS: ED Triage Vitals  Enc Vitals Group     BP 12/17/15 0544 154/108 mmHg     Pulse Rate 12/17/15 0600 48     Resp 12/17/15 0544 24     Temp --      Temp src --      SpO2 12/17/15 0544 98 %     Weight 12/17/15 0544 151 lb (68.493 kg)     Height 12/17/15 0544 5' (1.524 m)     Head Cir --      Peak Flow --      Pain  Score 12/17/15 0545 10     Pain Loc --      Pain Edu? --      Excl. in GC? --     Constitutional: Alert and oriented. Well appearing and in no acute distress. Eyes: Conjunctivae are normal. PERRL. EOMI. Head: Atraumatic. Nose: No congestion/rhinnorhea. Mouth/Throat: Mucous membranes are moist.  Oropharynx non-erythematous. Cardiovascular: bradycardia  regular rhythm. Grossly normal heart sounds.  Good peripheral circulation. Respiratory: Normal respiratory effort.  No retractions. Lungs CTAB. Gastrointestinal: Soft and nontender. No distention. Positive bowel sounds Musculoskeletal: No lower extremity tenderness nor edema.   Neurologic:  Normal  speech and language.  Skin:  Skin is warm, dry and intact.  Psychiatric: Mood and affect are normal.   ____________________________________________   LABS (all labs ordered are listed, but only abnormal results are displayed)  Labs Reviewed  COMPREHENSIVE METABOLIC PANEL - Abnormal; Notable for the following:    Glucose, Bld 114 (*)    All other components within normal limits  CBC - Abnormal; Notable for the following:    WBC 11.8 (*)    All other components within normal limits  URINALYSIS COMPLETEWITH MICROSCOPIC ([facility] ONLY) - Abnormal; Notable for the following:    Color, Urine STRAW (*)    APPearance CLEAR (*)    Hgb urine dipstick 1+ (*)    Bacteria, UA RARE (*)    Squamous Epithelial / LPF 0-5 (*)    All other components within normal limits  LIPASE, BLOOD  FIBRIN DERIVATIVES D-DIMER ([facility] ONLY)   ____________________________________________  ____________________________________________  RADIOLOGY  CT abd and pelvis pending ____________________________________________   PROCEDURES  Procedure(s) performed: None  Critical Care performed: No  ____________________________________________   INITIAL IMPRESSION / ASSESSMENT AND PLAN / ED COURSE  Pertinent labs & imaging results that were available during my care of the patient were reviewed by me and considered in my medical decision making (see chart for details).  This is a 20 year old female who comes into the hospital today with some left-sided chest pain. The patient reports that the pain extends underneath her chest wall. The patient's blood work is unremarkable. Her d-dimer is negative. We will continue to reassess the patient.  The patient's care will be signed out to Dr. Cyril Loosen who will follow up the results of the CT scan.  ____________________________________________   FINAL CLINICAL IMPRESSION(S) / ED DIAGNOSES  Final diagnoses:  Chest pain, unspecified chest pain type  Left upper quadrant pain       NEW MEDICATIONS STARTED DURING THIS VISIT:  New Prescriptions   No medications on file     Note:  This document was prepared using Dragon voice recognition software and may include unintentional dictation errors.    Rebecka Apley, MD 12/17/15 [phone]

## 2015-12-17 NOTE — ED Notes (Signed)
Report to greg, rn

## 2016-08-17 ENCOUNTER — Encounter: Payer: Self-pay | Admitting: Emergency Medicine

## 2016-08-17 ENCOUNTER — Emergency Department: Payer: Medicaid Other

## 2016-08-17 ENCOUNTER — Emergency Department
Admission: EM | Admit: 2016-08-17 | Discharge: 2016-08-17 | Disposition: A | Discharge status: Home / Self Care | Payer: Medicaid Other | Attending: Emergency Medicine | Admitting: Emergency Medicine

## 2016-08-17 DIAGNOSIS — F17210 Nicotine dependence, cigarettes, uncomplicated: Secondary | ICD-10-CM | POA: Insufficient documentation

## 2016-08-17 DIAGNOSIS — B349 Viral infection, unspecified: Secondary | ICD-10-CM | POA: Insufficient documentation

## 2016-08-17 DIAGNOSIS — N189 Chronic kidney disease, unspecified: Secondary | ICD-10-CM | POA: Diagnosis not present

## 2016-08-17 DIAGNOSIS — J101 Influenza due to other identified influenza virus with other respiratory manifestations: Secondary | ICD-10-CM | POA: Insufficient documentation

## 2016-08-17 DIAGNOSIS — J449 Chronic obstructive pulmonary disease, unspecified: Secondary | ICD-10-CM | POA: Insufficient documentation

## 2016-08-17 DIAGNOSIS — R05 Cough: Secondary | ICD-10-CM | POA: Diagnosis present

## 2016-08-17 LAB — INFLUENZA PANEL BY PCR (TYPE A & B)
Influenza A By PCR: NEGATIVE
Influenza B By PCR: POSITIVE — AB

## 2016-08-17 NOTE — ED Triage Notes (Signed)
Pt presents to ED c/o cough today. Pt states she has had flu symptoms that have worsened. " I have been getting into spells where I can hardly breathe and feel like I am going to pass out". Pt states she was told and treated for the flu from [facility] but they never tested her. Pt was given inhalers, tamiflu, nasal spray, without relief.

## 2016-08-17 NOTE — ED Provider Notes (Signed)
[facility] Emergency Department Provider Note  Time seen: 5:37 PM  I have reviewed the triage vital signs and the nursing notes.   HISTORY  Chief Complaint Cough    HPI Brooke Aguirre is a 21 y.o. female with COPD who presents the emergency department for cough and congestion. According to the patient for the past 5 days she has been coughing feeling congested. States she has not spiked a fever. Has been seen by her primary care doctor and currently on Tamiflu, I breathe and pro-air, for likely influenza although the patient states she was never tested. Patient states she continues to feel bad and has small children at home that she has been avoiding because she thought she might have influenza and wants to be tested for influenza. States continued shortness of breath. Occasional cough.  Past Medical History:  Diagnosis Date  . Chronic kidney disease    CHRONIC  RIGHT PYELONEPHRITIS  . COPD (chronic obstructive pulmonary disease) (HCC)   . Family history of adverse reaction to anesthesia    PTS MOM WENT INTO COMA AFTER HYSTERECTOMY  . Heart murmur   . Thyroid goiter     Patient Active Problem List   Diagnosis Date Noted  . IUGR (intrauterine growth restriction) 11/28/2015    Past Surgical History:  Procedure Laterality Date  . REMOVAL OF DRUG DELIVERY IMPLANT Left 12/15/2014   Procedure: REMOVAL OF DRUG DELIVERY IMPLANT;  Surgeon: Nadara Mustardobert P Harris, MD;  Location: [facility] ORS;  Service: Gynecology;  Laterality: Left;    Prior to Admission medications   Medication Sig Start Date End Date Taking? Authorizing Provider  naproxen sodium (ANAPROX) 220 MG tablet Take 220 mg by mouth 3 (three) times daily with meals.     Historical Provider, MD  polyethylene glycol (MIRALAX) packet Take 17 g by mouth daily. 12/17/15   Jene Everyobert Kinner, MD  traMADol (ULTRAM) 50 MG tablet Take 1 tablet (50 mg total) by mouth every 6 (six) hours as needed. 12/17/15 12/16/16  Jene Everyobert Kinner,  MD    Allergies  Allergen Reactions  . Kiwi Extract Anaphylaxis  . Cranberry Rash  . Penicillins Rash  . Sulfa Antibiotics Rash and Other (See Comments)    BLISTERS    History reviewed. No pertinent family history.  Social History Social History  Substance Use Topics  . Smoking status: Current Every Day Smoker    Packs/day: 0.50    Years: 5.00    Types: Cigarettes  . Smokeless tobacco: Never Used  . Alcohol use No    Review of Systems Constitutional: Negative for fever. Cardiovascular: Negative for chest pain. Respiratory: Positive for cough. Mild shortness of breath. Gastrointestinal: Negative for abdominal pain Neurological: Negative for headache 10-point ROS otherwise negative.  ____________________________________________   PHYSICAL EXAM:  VITAL SIGNS: ED Triage Vitals  Enc Vitals Group     BP 08/17/16 1632 129/79     Pulse Rate 08/17/16 1632 (!) 102     Resp 08/17/16 1632 (!) 21     Temp 08/17/16 1632 98.4 F (36.9 C)     Temp Source 08/17/16 1632 Oral     SpO2 08/17/16 1632 97 %     Weight 08/17/16 1633 160 lb (72.6 kg)     Height 08/17/16 1633 4\' 11"  (1.499 m)     Head Circumference --      Peak Flow --      Pain Score --      Pain Loc --  Pain Edu? --      Excl. in GC? --     Constitutional: Alert and oriented. Well appearing and in no distress. Eyes: Normal exam ENT   Head: Normocephalic and atraumatic.   Mouth/Throat: Mucous membranes are moist.Normal oropharynx. Cardiovascular: Normal rate, regular rhythm. No murmur Respiratory: Normal respiratory effort without tachypnea nor retractions. Breath sounds are clear and equal bilaterally. No wheezes/rales/rhonchi. Gastrointestinal: Soft and nontender. No distention.   Musculoskeletal: Nontender with normal range of motion in all extremities.  Neurologic:  Normal speech and language. No gross focal neurologic deficits  Skin:  Skin is warm, dry and intact.  Psychiatric: Mood and  affect are normal. Speech and behavior are normal.   ____________________________________________     RADIOLOGY  Chest x-ray is clear  ____________________________________________   INITIAL IMPRESSION / ASSESSMENT AND PLAN / ED COURSE  Pertinent labs & imaging results that were available during my care of the patient were reviewed by me and considered in my medical decision making (see chart for details).  Chest x-ray is clear. Patient appears very well on exam, clear lung sounds, no wheeze. Large a normal vitals. Patient's main concern is ensuring that she does not have influenza as she has young children at home. We will check for influenza swab although I discussed with the patient the treatment would likely remain unchanged.  Influenza B-positive.  ____________________________________________   FINAL CLINICAL IMPRESSION(S) / ED DIAGNOSES  Viral illness     Minna Antis, MD 08/17/16 [phone]

## 2016-08-17 NOTE — ED Notes (Signed)
Patient denies pain and is resting comfortably.  

## 2016-08-17 NOTE — ED Notes (Signed)
FIRST NURSE NOTE: Pt states she was dx with the flu on Friday but was not tested. Pt states she has young children at home and continues to c/o cough and chest congestion. Mask applied.

## 2016-08-25 ENCOUNTER — Emergency Department
Admission: EM | Admit: 2016-08-25 | Discharge: 2016-08-25 | Disposition: A | Discharge status: Home / Self Care | Payer: Medicaid Other | Attending: Emergency Medicine | Admitting: Emergency Medicine

## 2016-08-25 DIAGNOSIS — N189 Chronic kidney disease, unspecified: Secondary | ICD-10-CM | POA: Diagnosis not present

## 2016-08-25 DIAGNOSIS — Z4800 Encounter for change or removal of nonsurgical wound dressing: Secondary | ICD-10-CM | POA: Diagnosis present

## 2016-08-25 DIAGNOSIS — Z5189 Encounter for other specified aftercare: Secondary | ICD-10-CM

## 2016-08-25 DIAGNOSIS — F17210 Nicotine dependence, cigarettes, uncomplicated: Secondary | ICD-10-CM | POA: Insufficient documentation

## 2016-08-25 DIAGNOSIS — J449 Chronic obstructive pulmonary disease, unspecified: Secondary | ICD-10-CM | POA: Insufficient documentation

## 2016-08-25 DIAGNOSIS — Z79899 Other long term (current) drug therapy: Secondary | ICD-10-CM | POA: Diagnosis not present

## 2016-08-25 MED ORDER — CLINDAMYCIN HCL 150 MG PO CAPS
150.0000 mg | ORAL_CAPSULE | Freq: Once | ORAL | Status: AC
  Administered 2016-08-25: 150 mg via ORAL
  Filled 2016-08-25: qty 1

## 2016-08-25 MED ORDER — OXYCODONE-ACETAMINOPHEN 5-325 MG PO TABS: 1.0000 | ORAL_TABLET | Freq: Four times a day (QID) | ORAL | 0 refills | Status: DC | PRN

## 2016-08-25 MED ORDER — CLINDAMYCIN HCL 150 MG PO CAPS: 150.0000 mg | ORAL_CAPSULE | Freq: Four times a day (QID) | ORAL | 0 refills | Status: DC

## 2016-08-25 NOTE — ED Notes (Signed)
approx quarter size, reddened around with black center. No drainage at this time.

## 2016-08-25 NOTE — ED Triage Notes (Signed)
Pt arrives to ER via POV c/o "bump" to back that she has had since she had epidural X 9 months ago. PT reports that it was itching and painful today and burst and is now bleeding. Pt alert and oriented X4, active, cooperative, pt in NAD. RR even and unlabored, color WNL.

## 2016-08-25 NOTE — ED Provider Notes (Signed)
[facility] Emergency Department Provider Note   ____________________________________________   First MD Initiated Contact with Patient 08/25/16 1933     (approximate)  I have reviewed the triage vital signs and the nursing notes.   HISTORY  Chief Complaint Wound Check    HPI Brooke Aguirre is a 21 y.o. female patient complaining of a papular lesion from epidural 9 months ago has ruptured. Patient stated there was some mild leading and greenish discharge when the lesion ruptured.Ruptured occurred approximately 4 hours ago. Patient denies pain at this time.   Past Medical History:  Diagnosis Date  . Chronic kidney disease    CHRONIC  RIGHT PYELONEPHRITIS  . COPD (chronic obstructive pulmonary disease) (HCC)   . Family history of adverse reaction to anesthesia    PTS MOM WENT INTO COMA AFTER HYSTERECTOMY  . Heart murmur   . Thyroid goiter     Patient Active Problem List   Diagnosis Date Noted  . IUGR (intrauterine growth restriction) 11/28/2015    Past Surgical History:  Procedure Laterality Date  . REMOVAL OF DRUG DELIVERY IMPLANT Left 12/15/2014   Procedure: REMOVAL OF DRUG DELIVERY IMPLANT;  Surgeon: Nadara Mustard, MD;  Location: [facility] ORS;  Service: Gynecology;  Laterality: Left;    Prior to Admission medications   Medication Sig Start Date End Date Taking? Authorizing Provider  clindamycin (CLEOCIN) 150 MG capsule Take 1 capsule (150 mg total) by mouth 4 (four) times daily. 08/25/16   Joni Reining, PA-C  naproxen sodium (ANAPROX) 220 MG tablet Take 220 mg by mouth 3 (three) times daily with meals.     Historical Provider, MD  oxyCODONE-acetaminophen (ROXICET) 5-325 MG tablet Take 1 tablet by mouth every 6 (six) hours as needed for severe pain. 08/25/16   Joni Reining, PA-C  polyethylene glycol [facility]) packet Take 17 g by mouth daily. 12/17/15   Jene Every, MD  traMADol (ULTRAM) 50 MG tablet Take 1 tablet (50 mg total) by mouth  every 6 (six) hours as needed. 12/17/15 12/16/16  Jene Every, MD    Allergies Kiwi extract; Tramadol; Cranberry; Penicillins; and Sulfa antibiotics  No family history on file.  Social History Social History  Substance Use Topics  . Smoking status: Current Every Day Smoker    Packs/day: 0.50    Years: 5.00    Types: Cigarettes  . Smokeless tobacco: Never Used  . Alcohol use No    Review of Systems Constitutional: No fever/chills Eyes: No visual changes. ENT: No sore throat. Cardiovascular: Denies chest pain. Respiratory: Denies shortness of breath. Gastrointestinal: No abdominal pain.  No nausea, no vomiting.  No diarrhea.  No constipation. Genitourinary: Negative for dysuria. Musculoskeletal: Negative for back pain. Skin: Negative for rash. Erythematous papular lesion Center lower back. Neurological: Negative for headaches, focal weakness or numbness. Allergic/Immunilogical: See medication list   ____________________________________________   PHYSICAL EXAM:  VITAL SIGNS: ED Triage Vitals [08/25/16 1921]  Enc Vitals Group     BP (!) 119/96     Pulse Rate 85     Resp 18     Temp 97.9 F (36.6 C)     Temp Source Oral     SpO2 99 %     Weight 160 lb (72.6 kg)     Height 4\' 11"  (1.499 m)     Head Circumference      Peak Flow      Pain Score 5     Pain Loc  Pain Edu?      Excl. in GC?     Constitutional: Alert and oriented. Well appearing and in no acute distress. Eyes: Conjunctivae are normal. PERRL. EOMI. Head: Atraumatic. Nose: No congestion/rhinnorhea. Mouth/Throat: Mucous membranes are moist.  Oropharynx non-erythematous. Neck: No stridor.  No cervical spine tenderness to palpation. Hematological/Lymphatic/Immunilogical: No cervical lymphadenopathy. Cardiovascular: Normal rate, regular rhythm. Grossly normal heart sounds.  Good peripheral circulation. Respiratory: Normal respiratory effort.  No retractions. Lungs CTAB. Gastrointestinal: Soft and  nontender. No distention. No abdominal bruits. No CVA tenderness. Musculoskeletal: No lower extremity tenderness nor edema.  No joint effusions. Neurologic:  Normal speech and language. No gross focal neurologic deficits are appreciated. No gait instability. Skin:  Skin is warm, dry and intact. No rash noted. Edema and mild edema from a ruptured papular lesion Center of patient's back. Psychiatric: Mood and affect are normal. Speech and behavior are normal.  ____________________________________________   LABS (all labs ordered are listed, but only abnormal results are displayed)  Labs Reviewed - No data to display ____________________________________________  EKG   ____________________________________________  RADIOLOGY   ____________________________________________   PROCEDURES  Procedure(s) performed: None  Procedures  Critical Care performed: No  ____________________________________________   INITIAL IMPRESSION / ASSESSMENT AND PLAN / ED COURSE  Pertinent labs & imaging results that were available during my care of the patient were reviewed by me and considered in my medical decision making (see chart for details).  Skin infection secondary to a ruptured papular lesion. Patient given discharge care instructions. Patient given a prescription for clindamycin and Percocets. Patient advised follow-up family doctor condition shows no improvement in 3-5 days.      ____________________________________________   FINAL CLINICAL IMPRESSION(S) / ED DIAGNOSES  Final diagnoses:  Visit for wound check      NEW MEDICATIONS STARTED DURING THIS VISIT:  New Prescriptions   CLINDAMYCIN (CLEOCIN) 150 MG CAPSULE    Take 1 capsule (150 mg total) by mouth 4 (four) times daily.   OXYCODONE-ACETAMINOPHEN (ROXICET) 5-325 MG TABLET    Take 1 tablet by mouth every 6 (six) hours as needed for severe pain.     Note:  This document was prepared using Dragon voice recognition  software and may include unintentional dictation errors.    Joni ReiningRonald K Naleigha Raimondi, PA-C 08/25/16 2009    Loleta Roseory Forbach, MD 08/25/16 2021

## 2016-09-07 ENCOUNTER — Encounter: Payer: Self-pay | Admitting: Emergency Medicine

## 2016-09-07 ENCOUNTER — Emergency Department
Admission: EM | Admit: 2016-09-07 | Discharge: 2016-09-07 | Disposition: A | Discharge status: Home / Self Care | Payer: Medicaid Other | Attending: Emergency Medicine | Admitting: Emergency Medicine

## 2016-09-07 DIAGNOSIS — N189 Chronic kidney disease, unspecified: Secondary | ICD-10-CM | POA: Insufficient documentation

## 2016-09-07 DIAGNOSIS — J029 Acute pharyngitis, unspecified: Secondary | ICD-10-CM | POA: Diagnosis present

## 2016-09-07 DIAGNOSIS — F17210 Nicotine dependence, cigarettes, uncomplicated: Secondary | ICD-10-CM | POA: Diagnosis not present

## 2016-09-07 DIAGNOSIS — J449 Chronic obstructive pulmonary disease, unspecified: Secondary | ICD-10-CM | POA: Insufficient documentation

## 2016-09-07 DIAGNOSIS — Z79899 Other long term (current) drug therapy: Secondary | ICD-10-CM | POA: Diagnosis not present

## 2016-09-07 LAB — POCT RAPID STREP A: STREPTOCOCCUS, GROUP A SCREEN (DIRECT): NEGATIVE

## 2016-09-07 LAB — MONONUCLEOSIS SCREEN: Mono Screen: NEGATIVE

## 2016-09-07 MED ORDER — PREDNISONE 10 MG PO TABS: ORAL_TABLET | ORAL | 0 refills | Status: DC

## 2016-09-07 NOTE — ED Provider Notes (Signed)
[facility] Emergency Department Provider Note   ____________________________________________   First MD Initiated Contact with Patient 09/07/16 1244     (approximate)  I have reviewed the triage vital signs and the nursing notes.   HISTORY  Chief Complaint Sore Throat    HPI Brooke Aguirre is a 21 y.o. female is here with complaint of sore throat for the last 8 days.Patient states that last Saturday she had a temperature of 102. She states that she continues to have pain with swallowing. She has not had any fever in the last 48 hours. She denies any cough or congestion. She denies nausea, vomiting or diarrhea. Patient has not had any exposure to strep throat. She has no difficulty swallowing her saliva. No family members with similar symptoms at home. She rates her pain as 9/10.   Past Medical History:  Diagnosis Date  . Chronic kidney disease    CHRONIC  RIGHT PYELONEPHRITIS  . COPD (chronic obstructive pulmonary disease) (HCC)   . Family history of adverse reaction to anesthesia    PTS MOM WENT INTO COMA AFTER HYSTERECTOMY  . Heart murmur   . Thyroid goiter     Patient Active Problem List   Diagnosis Date Noted  . IUGR (intrauterine growth restriction) 11/28/2015    Past Surgical History:  Procedure Laterality Date  . REMOVAL OF DRUG DELIVERY IMPLANT Left 12/15/2014   Procedure: REMOVAL OF DRUG DELIVERY IMPLANT;  Surgeon: Nadara Mustardobert P Harris, MD;  Location: [facility] ORS;  Service: Gynecology;  Laterality: Left;    Prior to Admission medications   Medication Sig Start Date End Date Taking? Authorizing Provider  naproxen sodium (ANAPROX) 220 MG tablet Take 220 mg by mouth 3 (three) times daily with meals.     Historical Provider, MD  polyethylene glycol (MIRALAX) packet Take 17 g by mouth daily. 12/17/15   Jene Everyobert Kinner, MD  predniSONE (DELTASONE) 10 MG tablet Take 3 tablets once a day for 3 days 09/07/16   Tommi Rumpshonda L Jaide Hillenburg, PA-C    Allergies Kiwi  extract; Tramadol; Cranberry; Penicillins; and Sulfa antibiotics  History reviewed. No pertinent family history.  Social History Social History  Substance Use Topics  . Smoking status: Current Every Day Smoker    Packs/day: 0.50    Years: 5.00    Types: Cigarettes  . Smokeless tobacco: Never Used  . Alcohol use No    Review of Systems Constitutional: Resolved fever/chills Eyes: No visual changes. ENT: Positive sore throat. Cardiovascular: Denies chest pain. Respiratory: Denies shortness of breath. Gastrointestinal: No abdominal pain.  No nausea, no vomiting.  No diarrhea.  Musculoskeletal: Negative for back pain. Skin: Negative for rash. Neurological: Negative for headaches, focal weakness or numbness.  10-point ROS otherwise negative.  ____________________________________________   PHYSICAL EXAM:  VITAL SIGNS: ED Triage Vitals  Enc Vitals Group     BP 09/07/16 1122 129/80     Pulse Rate 09/07/16 1122 (!) 110     Resp 09/07/16 1122 16     Temp 09/07/16 1122 98.8 F (37.1 C)     Temp Source 09/07/16 1122 Oral     SpO2 09/07/16 1122 98 %     Weight 09/07/16 1116 160 lb (72.6 kg)     Height 09/07/16 1116 4\' 11"  (1.499 m)     Head Circumference --      Peak Flow --      Pain Score 09/07/16 1116 9     Pain Loc --  Pain Edu? --      Excl. in GC? --     Constitutional: Alert and oriented. Well appearing and in no acute distress. Eyes: Conjunctivae are normal. PERRL. EOMI. Head: Atraumatic. Nose: No congestion/rhinnorhea.  EACs and TMs are clear. Mouth/Throat: Mucous membranes are moist.  Oropharynx non-erythematous. Mild posterior drainage seen. Neck: No stridor.   Hematological/Lymphatic/Immunilogical: Moderate tenderness bilaterally and positive cervical lymphadenopathy. Cardiovascular: Normal rate, regular rhythm. Grossly normal heart sounds.  Good peripheral circulation. Respiratory: Normal respiratory effort.  No retractions. Lungs  CTAB. Gastrointestinal: Soft and nontender. No distention.  Musculoskeletal: Moves upper and lower extremities without any difficulty. Normal gait was noted. Neurologic:  Normal speech and language. No gross focal neurologic deficits are appreciated. No gait instability. Skin:  Skin is warm, dry and intact. No rash noted. Psychiatric: Mood and affect are normal. Speech and behavior are normal.  ____________________________________________   LABS (all labs ordered are listed, but only abnormal results are displayed)  Labs Reviewed  MONONUCLEOSIS SCREEN  POCT RAPID STREP A     PROCEDURES  Procedure(s) performed: None  Procedures  Critical Care performed: No  ____________________________________________   INITIAL IMPRESSION / ASSESSMENT AND PLAN / ED COURSE  Pertinent labs & imaging results that were available during my care of the patient were reviewed by me and considered in my medical decision making (see chart for details).  Patient is follow-up with primary care doctor if any continued problems. She was made aware that her mono test and strep were both negative. Patient is to take Tylenol as needed for throat pain. She is started on prednisone 30 mg once a day for the next 3 days. She is also instructed to obtain over-the-counter Zyrtec or Claritin for posterior drainage. She is encouraged to increase fluids.      ____________________________________________   FINAL CLINICAL IMPRESSION(S) / ED DIAGNOSES  Final diagnoses:  Acute pharyngitis, unspecified etiology      NEW MEDICATIONS STARTED DURING THIS VISIT:  New Prescriptions   PREDNISONE (DELTASONE) 10 MG TABLET    Take 3 tablets once a day for 3 days     Note:  This document was prepared using Dragon voice recognition software and may include unintentional dictation errors.    Tommi Rumps, PA-C 09/07/16 1406    Governor Rooks, MD 09/07/16 [phone]

## 2016-09-07 NOTE — Discharge Instructions (Signed)
Follow-up with your primary care doctor if any continued problems. Tylenol if needed for fever or throat pain. Begin taking prednisone as instructed for the next 3 days. Obtain a Claritin or Zyrtec over-the-counter as needed for ear symptoms. Increase fluids.

## 2016-09-07 NOTE — ED Notes (Signed)
Pt c/o sore throat x 8 days, states painful swallowing at this time. Reports fever last Saturday, denies any fever since. Pt is alert and oriented, maintains own saliva without difficulty.

## 2016-09-07 NOTE — ED Triage Notes (Signed)
Sore throat X 8 days. 102 fever last Saturday a week ago but none since. NAD

## 2016-09-11 ENCOUNTER — Emergency Department
Admission: EM | Admit: 2016-09-11 | Discharge: 2016-09-11 | Disposition: A | Discharge status: Home / Self Care | Payer: Medicaid Other | Attending: Emergency Medicine | Admitting: Emergency Medicine

## 2016-09-11 DIAGNOSIS — J449 Chronic obstructive pulmonary disease, unspecified: Secondary | ICD-10-CM | POA: Diagnosis not present

## 2016-09-11 DIAGNOSIS — N189 Chronic kidney disease, unspecified: Secondary | ICD-10-CM | POA: Diagnosis not present

## 2016-09-11 DIAGNOSIS — J029 Acute pharyngitis, unspecified: Secondary | ICD-10-CM | POA: Diagnosis not present

## 2016-09-11 DIAGNOSIS — F17210 Nicotine dependence, cigarettes, uncomplicated: Secondary | ICD-10-CM | POA: Diagnosis not present

## 2016-09-11 DIAGNOSIS — Z79899 Other long term (current) drug therapy: Secondary | ICD-10-CM | POA: Insufficient documentation

## 2016-09-11 MED ORDER — NAPROXEN 500 MG PO TBEC: 500.0000 mg | DELAYED_RELEASE_TABLET | Freq: Two times a day (BID) | ORAL | 0 refills | Status: AC

## 2016-09-11 MED ORDER — NAPROXEN 500 MG PO TABS
500.0000 mg | ORAL_TABLET | Freq: Once | ORAL | Status: AC
  Administered 2016-09-11: 500 mg via ORAL
  Filled 2016-09-11: qty 1

## 2016-09-11 MED ORDER — OMEPRAZOLE 20 MG PO CPDR: 20.0000 mg | DELAYED_RELEASE_CAPSULE | Freq: Every day | ORAL | 1 refills | Status: DC

## 2016-09-11 NOTE — ED Notes (Signed)
Pt seen on Sunday for sore throat, prescribed prednisone and no better.  Pt alert and oriented X4, active, cooperative, pt in NAD. RR even and unlabored, color WNL.

## 2016-09-11 NOTE — ED Notes (Signed)

## 2016-09-11 NOTE — ED Provider Notes (Signed)
[facility] Emergency Department Provider Note  ____________________________________________  Time seen: Approximately 11:38 PM  I have reviewed the triage vital signs and the nursing notes.   HISTORY  Chief Complaint Sore Throat   HPI Brooke Aguirre is a 21 y.o. female presenting to the emergency department with 8/10 aching pharyngitis for the past 12 days. Patient was seen at the emergency department on 09/07/16 with similar symptoms. She was discharged with prednisone at aforementioned ED encounter. Patient states that she took prednisone as directed. Patient states that she experienced almost total pharyngitis relief while taking prednisone. However, pharyngitis returned after prednisone completion. Patient states that her pharyngitis is worse at night in a supine position and accompanied by a cough. Patient states that she has been tolerating fluids by mouth and her own secretions. She has been afebrile.   Past Medical History:  Diagnosis Date  . Chronic kidney disease    CHRONIC  RIGHT PYELONEPHRITIS  . COPD (chronic obstructive pulmonary disease) (HCC)   . Family history of adverse reaction to anesthesia    PTS MOM WENT INTO COMA AFTER HYSTERECTOMY  . Heart murmur   . Thyroid goiter     Patient Active Problem List   Diagnosis Date Noted  . IUGR (intrauterine growth restriction) 11/28/2015    Past Surgical History:  Procedure Laterality Date  . REMOVAL OF DRUG DELIVERY IMPLANT Left 12/15/2014   Procedure: REMOVAL OF DRUG DELIVERY IMPLANT;  Surgeon: Nadara Mustard, MD;  Location: [facility] ORS;  Service: Gynecology;  Laterality: Left;    Prior to Admission medications   Medication Sig Start Date End Date Taking? Authorizing Provider  naproxen (EC NAPROSYN) 500 MG EC tablet Take 1 tablet (500 mg total) by mouth 2 (two) times daily with a meal. 09/11/16 09/21/16  Orvil Feil, PA-C  naproxen sodium (ANAPROX) 220 MG tablet Take 220 mg by mouth 3 (three)  times daily with meals.     Historical Provider, MD  omeprazole (PRILOSEC) 20 MG capsule Take 1 capsule (20 mg total) by mouth daily. 09/11/16 10/09/16  Orvil Feil, PA-C  polyethylene glycol [facility]) packet Take 17 g by mouth daily. 12/17/15   Jene Every, MD  predniSONE (DELTASONE) 10 MG tablet Take 3 tablets once a day for 3 days 09/07/16   Tommi Rumps, PA-C    Allergies Kiwi extract; Tramadol; Cranberry; Penicillins; and Sulfa antibiotics  No family history on file.  Social History Social History  Substance Use Topics  . Smoking status: Current Every Day Smoker    Packs/day: 0.50    Years: 5.00    Types: Cigarettes  . Smokeless tobacco: Never Used  . Alcohol use No     Review of Systems  Constitutional: No fever/chills Eyes: No visual changes. No discharge ENT: Patient has pharyngitis  Cardiovascular: no chest pain. Respiratory: no cough. No SOB. Gastrointestinal: No abdominal pain.  No nausea, no vomiting.  No diarrhea.  No constipation. Musculoskeletal: Negative for musculoskeletal pain. Skin: Negative for rash, abrasions, lacerations, ecchymosis. Neurological: Negative for headaches, focal weakness or numbness. ____________________________________________   PHYSICAL EXAM:  VITAL SIGNS: ED Triage Vitals [09/11/16 2056]  Enc Vitals Group     BP (!) 147/82     Pulse Rate (!) 102     Resp 18     Temp 98.9 F (37.2 C)     Temp Source Oral     SpO2 98 %     Weight 170 lb (77.1 kg)  Height 4\' 11"  (1.499 m)     Head Circumference      Peak Flow      Pain Score 0     Pain Loc      Pain Edu?      Excl. in GC?    Constitutional: Alert and oriented. Patient is talkative and engaged.  Eyes: Palpebral and bulbar conjunctiva are nonerythematous bilaterally. PERRL. EOMI. No scleral icterus bilaterally. Head: Atraumatic. ENT:      Ears: Tympanic membranes are pearly bilaterally with mild effusion bilaterally. Bony landmarks are visualized bilaterally.        Nose: Skin overlying nares is without erythema. Nasal turbinates are non-erythematous. Nasal septum is midline.      Mouth/Throat: Mucous membranes are moist. Posterior pharynx is mildly erythematous. No tonsillar exudate, hypertrophy or petechiae visualized. Uvula is midline. Neck: Full range of motion. No pain with neck flexion. Hematological/Lymphatic/Immunilogical: No cervical lymphadenopathy.  Cardiovascular:  Normal S1 and S2. No murmurs, gallops or rubs auscultated.  Respiratory:  On auscultation, adventitious sounds are absent.  Neurologic:  Normal for age. No gross focal neurologic deficits are appreciated.  Skin:  Skin is warm, dry and intact. No rash noted.  Psychiatric: Mood and affect are normal for age. Speech and behavior are normal.   ____________________________________________   LABS (all labs ordered are listed, but only abnormal results are displayed)  Labs Reviewed - No data to display ____________________________________________  EKG   ____________________________________________   No results found.  ____________________________________________    PROCEDURES  Procedure(s) performed:    Procedures    Medications  naproxen (NAPROSYN) tablet 500 mg (not administered)     ____________________________________________   INITIAL IMPRESSION / ASSESSMENT AND PLAN / ED COURSE  Pertinent labs & imaging results that were available during my care of the patient were reviewed by me and considered in my medical decision making (see chart for details).  Review of the [place] CSRS was performed in accordance of the [facility] prior to dispensing any controlled drugs.     Assessment and Plan:  Pharyngitis:  Patient presents to the emergency department with pharyngitis for the past 12 days refractory to prednisone. On physical exam, patient's posterior pharynx is mildly erythematous without tonsillar hypertrophy or exudate. As patient experiences worsening  pharyngitis with supine position in association with cough, I suspect GERD. Patient was started on a trial of omeprazole. Patient was also discharged with naproxen for pharyngitis. A referral was made to otolaryngology. Patient was advised to make an appointment in one week if pharyngitis persists. Physical exam and vital signs during at this time. All patient questions were answered.  ____________________________________________  FINAL CLINICAL IMPRESSION(S) / ED DIAGNOSES  Final diagnoses:  Pharyngitis, unspecified etiology      NEW MEDICATIONS STARTED DURING THIS VISIT:  New Prescriptions   NAPROXEN (EC NAPROSYN) 500 MG EC TABLET    Take 1 tablet (500 mg total) by mouth 2 (two) times daily with a meal.   OMEPRAZOLE (PRILOSEC) 20 MG CAPSULE    Take 1 capsule (20 mg total) by mouth daily.        This chart was dictated using voice recognition software/Dragon. Despite best efforts to proofread, errors can occur which can change the meaning. Any change was purely unintentional.    Orvil FeilJaclyn M Saleh Ulbrich, PA-C 09/11/16 2357    Emily FilbertJonathan E Williams, MD 09/15/16 [phone]

## 2016-09-11 NOTE — ED Triage Notes (Signed)
Pt in with co sore throat for almost 2 weeks seen here Sunday for the same and had neg mono and strep tests. Was dx home with prednisone, here for persistent symptoms.

## 2016-10-08 ENCOUNTER — Inpatient Hospital Stay (HOSPITAL_COMMUNITY)
Admission: AD | Admit: 2016-10-08 | Discharge: 2016-10-08 | Disposition: A | Discharge status: Home / Self Care | Payer: Medicaid Other | Source: Ambulatory Visit | Attending: Obstetrics & Gynecology | Admitting: Obstetrics & Gynecology

## 2016-10-08 DIAGNOSIS — Z975 Presence of (intrauterine) contraceptive device: Secondary | ICD-10-CM | POA: Insufficient documentation

## 2016-10-08 DIAGNOSIS — F17210 Nicotine dependence, cigarettes, uncomplicated: Secondary | ICD-10-CM | POA: Insufficient documentation

## 2016-10-08 DIAGNOSIS — Z880 Allergy status to penicillin: Secondary | ICD-10-CM | POA: Insufficient documentation

## 2016-10-08 DIAGNOSIS — N939 Abnormal uterine and vaginal bleeding, unspecified: Secondary | ICD-10-CM | POA: Insufficient documentation

## 2016-10-08 LAB — URINALYSIS, ROUTINE W REFLEX MICROSCOPIC
BILIRUBIN URINE: NEGATIVE
Bacteria, UA: NONE SEEN
Glucose, UA: NEGATIVE mg/dL
KETONES UR: NEGATIVE mg/dL
LEUKOCYTES UA: NEGATIVE
Nitrite: NEGATIVE
PH: 6 (ref 5.0–8.0)
Protein, ur: NEGATIVE mg/dL
SPECIFIC GRAVITY, URINE: 1.017 (ref 1.005–1.030)

## 2016-10-08 LAB — CBC
HCT: 41.8 % (ref 36.0–46.0)
HEMOGLOBIN: 14.6 g/dL (ref 12.0–15.0)
MCH: 32.5 pg (ref 26.0–34.0)
MCHC: 34.9 g/dL (ref 30.0–36.0)
MCV: 93.1 fL (ref 78.0–100.0)
PLATELETS: 214 10*3/uL (ref 150–400)
RBC: 4.49 MIL/uL (ref 3.87–5.11)
RDW: 13.4 % (ref 11.5–15.5)
WBC: 10.1 10*3/uL (ref 4.0–10.5)

## 2016-10-08 LAB — WET PREP, GENITAL
SPERM: NONE SEEN
Trich, Wet Prep: NONE SEEN
YEAST WET PREP: NONE SEEN

## 2016-10-08 NOTE — MAU Note (Signed)
Presents with vaginal bleeding beginning approximately 5 weeks ago.  Bleeding increases intermittently for 5-6 days then returns to spotting.  Passing some small blood clots.  Had a paragard IUD inserted in 12/2015 and has been unable to feel the strings during a check yesterday.  Last felt strings 05/2016.  Last intercourse occurred approximately 5 weeks ago, which was when the bleeding started.  Has not seen anyone for these symptoms.  Experiencing mild intermittent cramping.

## 2016-10-08 NOTE — MAU Provider Note (Signed)
History     CSN: [id number]  Arrival date and time: 10/08/16 2034   First Provider Initiated Contact with Patient 10/08/16 2117      Chief Complaint  Patient presents with  . Vaginal Bleeding   Brooke Aguirre is a 21 y.o. G2P1002 who presents today with vaginal bleeding. She states that she started bleeding around 09/05/16. It was the time when her peirod was due, and she has continued to bleed since. She states that her period is due now and the bleeding has been heavier in the last few days. She has a paragard IUD that was inserted around June 2017, and she has not normal periods since that time.    Vaginal Bleeding  The patient's primary symptoms include vaginal bleeding. The patient's pertinent negatives include no pelvic pain. This is a new problem. The current episode started more than 1 month ago. The problem occurs constantly. The problem has been waxing and waning. The patient is experiencing no pain. She is not pregnant. Pertinent negatives include no chills, dysuria, fever, frequency, nausea, urgency or vomiting. The vaginal bleeding is typical of menses. She has been passing clots. She has not been passing tissue. Nothing aggravates the symptoms. She uses an IUD for contraception. Menstrual history: 09/05/16       Past Medical History:  Diagnosis Date  . Chronic kidney disease    CHRONIC  RIGHT PYELONEPHRITIS  . COPD (chronic obstructive pulmonary disease) (HCC)   . Family history of adverse reaction to anesthesia    PTS MOM WENT INTO COMA AFTER HYSTERECTOMY  . Heart murmur   . Thyroid goiter     Past Surgical History:  Procedure Laterality Date  . REMOVAL OF DRUG DELIVERY IMPLANT Left 12/15/2014   Procedure: REMOVAL OF DRUG DELIVERY IMPLANT;  Surgeon: Nadara Mustardobert P Harris, MD;  Location: [facility] ORS;  Service: Gynecology;  Laterality: Left;    No family history on file.  Social History  Substance Use Topics  . Smoking status: Current Every Day Smoker    Packs/day:  0.50    Years: 5.00    Types: Cigarettes  . Smokeless tobacco: Never Used  . Alcohol use No    Allergies:  Allergies  Allergen Reactions  . Kiwi Extract Anaphylaxis  . Tramadol   . Amoxicillin Hives, Other (See Comments) and Rash    blisters  . Cranberry Rash  . Penicillins Rash    Has patient had a PCN reaction causing immediate rash, facial/tongue/throat swelling, SOB or lightheadedness with hypotension:no Has patient had a PCN reaction causing severe rash involving mucus membranes or skin necrosis: yes Has patient had a PCN reaction that required hospitalization no Has patient had a PCN reaction occurring within the last 10 years: yes If all of the above answers are "NO", then may proceed with Cephalosporin use.   . Sulfa Antibiotics Rash and Other (See Comments)    BLISTERS    Prescriptions Prior to Admission  Medication Sig Dispense Refill Last Dose  . levonorgestrel (MIRENA) 20 MCG/24HR IUD 1 each by Intrauterine route once.   10/08/2016 at Unknown time  . PROAIR HFA 108 (90 Base) MCG/ACT inhaler Inhale 1-2 puffs into the lungs every 6 (six) hours as needed for shortness of breath.   5 Past Month at Unknown time  . omeprazole (PRILOSEC) 20 MG capsule Take 1 capsule (20 mg total) by mouth daily. (Patient not taking: Reported on 10/08/2016) 60 capsule 1 Not Taking at Unknown time  . polyethylene glycol (MIRALAX) packet Take  17 g by mouth daily. (Patient not taking: Reported on 10/08/2016) 14 each 0 Not Taking at Unknown time  . predniSONE (DELTASONE) 10 MG tablet Take 3 tablets once a day for 3 days (Patient not taking: Reported on 10/08/2016) 9 tablet 0 Not Taking at Unknown time    Review of Systems  Constitutional: Negative for chills and fever.  Gastrointestinal: Negative for nausea and vomiting.  Genitourinary: Positive for vaginal bleeding. Negative for dysuria, frequency, pelvic pain and urgency.   Physical Exam   Blood pressure 124/77, pulse 91, temperature 98.1 F  (36.7 C), temperature source Oral, resp. rate 17, height 4\' 11"  (1.499 m), weight 170 lb (77.1 kg), unknown if currently breastfeeding.  Physical Exam  Nursing note and vitals reviewed. Constitutional: She is oriented to person, place, and time. She appears well-developed and well-nourished. No distress.  HENT:  Head: Normocephalic.  Cardiovascular: Normal rate.   Respiratory: Effort normal.  GI: Soft. There is no tenderness. There is no rebound.  Genitourinary:  Genitourinary Comments: External: no lesion Vagina: small amount of blood seen  Cervix: pink, smooth, no CMT, IUD tail seen at os.  Uterus: NSSC Adnexa: NT   Neurological: She is alert and oriented to person, place, and time.  Skin: Skin is warm and dry.  Psychiatric: She has a normal mood and affect.    Results for orders placed or performed during the hospital encounter of 10/08/16 (from the past 24 hour(s))  Urinalysis, Routine w reflex microscopic     Status: Abnormal   Collection Time: 10/08/16  8:41 PM  Result Value Ref Range   Color, Urine YELLOW YELLOW   APPearance CLEAR CLEAR   Specific Gravity, Urine 1.017 1.005 - 1.030   pH 6.0 5.0 - 8.0   Glucose, UA NEGATIVE NEGATIVE mg/dL   Hgb urine dipstick SMALL (A) NEGATIVE   Bilirubin Urine NEGATIVE NEGATIVE   Ketones, ur NEGATIVE NEGATIVE mg/dL   Protein, ur NEGATIVE NEGATIVE mg/dL   Nitrite NEGATIVE NEGATIVE   Leukocytes, UA NEGATIVE NEGATIVE   RBC / HPF 0-5 0 - 5 RBC/hpf   WBC, UA 0-5 0 - 5 WBC/hpf   Bacteria, UA NONE SEEN NONE SEEN   Squamous Epithelial / LPF 0-5 (A) NONE SEEN   Mucous PRESENT   Wet prep, genital     Status: Abnormal   Collection Time: 10/08/16  9:22 PM  Result Value Ref Range   Yeast Wet Prep HPF POC NONE SEEN NONE SEEN   Trich, Wet Prep NONE SEEN NONE SEEN   Clue Cells Wet Prep HPF POC PRESENT (A) NONE SEEN   WBC, Wet Prep HPF POC FEW (A) NONE SEEN   Sperm NONE SEEN   CBC     Status: None   Collection Time: 10/08/16  9:27 PM   Result Value Ref Range   WBC 10.1 4.0 - 10.5 K/uL   RBC 4.49 3.87 - 5.11 MIL/uL   Hemoglobin 14.6 12.0 - 15.0 g/dL   HCT 16.1 09.6 - 04.5 %   MCV 93.1 78.0 - 100.0 fL   MCH 32.5 26.0 - 34.0 pg   MCHC 34.9 30.0 - 36.0 g/dL   RDW 40.9 81.1 - 91.4 %   Platelets 214 150 - 400 K/uL    MAU Course  Procedures  MDM   Assessment and Plan   1. Abnormal uterine bleeding (AUB)    DC home Comfort measures reviewed  Bleeding precautions RX: none  Return to MAU as needed  Follow-up Information  [facility].   Specialty:  Obstetrics and Gynecology Contact information: [address] [place] [address] [phone]           Tawnya Crook 10/08/2016, 9:18 PM

## 2016-10-08 NOTE — Discharge Instructions (Signed)

## 2016-10-09 LAB — GC/CHLAMYDIA PROBE AMP (~~LOC~~) NOT AT ARMC
Chlamydia: NEGATIVE
NEISSERIA GONORRHEA: NEGATIVE

## 2016-10-09 LAB — POCT PREGNANCY, URINE: Preg Test, Ur: NEGATIVE

## 2016-11-07 ENCOUNTER — Encounter: Payer: Self-pay | Admitting: Obstetrics & Gynecology

## 2016-11-16 ENCOUNTER — Encounter: Payer: Self-pay | Admitting: Emergency Medicine

## 2016-11-16 ENCOUNTER — Emergency Department
Admission: EM | Admit: 2016-11-16 | Discharge: 2016-11-16 | Disposition: A | Discharge status: Home / Self Care | Payer: Medicaid Other | Attending: Emergency Medicine | Admitting: Emergency Medicine

## 2016-11-16 DIAGNOSIS — Z79899 Other long term (current) drug therapy: Secondary | ICD-10-CM | POA: Diagnosis not present

## 2016-11-16 DIAGNOSIS — N12 Tubulo-interstitial nephritis, not specified as acute or chronic: Secondary | ICD-10-CM | POA: Insufficient documentation

## 2016-11-16 DIAGNOSIS — F17210 Nicotine dependence, cigarettes, uncomplicated: Secondary | ICD-10-CM | POA: Diagnosis not present

## 2016-11-16 DIAGNOSIS — R109 Unspecified abdominal pain: Secondary | ICD-10-CM | POA: Diagnosis present

## 2016-11-16 DIAGNOSIS — N189 Chronic kidney disease, unspecified: Secondary | ICD-10-CM | POA: Diagnosis not present

## 2016-11-16 LAB — CBC
HEMATOCRIT: 44.2 % (ref 35.0–47.0)
Hemoglobin: 15.1 g/dL (ref 12.0–16.0)
MCH: 32.4 pg (ref 26.0–34.0)
MCHC: 34.1 g/dL (ref 32.0–36.0)
MCV: 95 fL (ref 80.0–100.0)
Platelets: 230 10*3/uL (ref 150–440)
RBC: 4.65 MIL/uL (ref 3.80–5.20)
RDW: 13.9 % (ref 11.5–14.5)
WBC: 19.3 10*3/uL — ABNORMAL HIGH (ref 3.6–11.0)

## 2016-11-16 LAB — URINALYSIS, COMPLETE (UACMP) WITH MICROSCOPIC
BACTERIA UA: NONE SEEN
BILIRUBIN URINE: NEGATIVE
Glucose, UA: NEGATIVE mg/dL
KETONES UR: NEGATIVE mg/dL
Nitrite: POSITIVE — AB
PH: 6 (ref 5.0–8.0)
Protein, ur: 100 mg/dL — AB
Specific Gravity, Urine: 1.019 (ref 1.005–1.030)

## 2016-11-16 LAB — BASIC METABOLIC PANEL
Anion gap: 10 (ref 5–15)
BUN: 11 mg/dL (ref 6–20)
CO2: 23 mmol/L (ref 22–32)
CREATININE: 0.66 mg/dL (ref 0.44–1.00)
Calcium: 9.2 mg/dL (ref 8.9–10.3)
Chloride: 104 mmol/L (ref 101–111)
GFR calc non Af Amer: >60 mL/min (ref >60)
GLUCOSE: 94 mg/dL (ref 65–99)
Potassium: 3.7 mmol/L (ref 3.5–5.1)
Sodium: 137 mmol/L (ref 135–145)

## 2016-11-16 LAB — PREGNANCY, URINE: Preg Test, Ur: NEGATIVE

## 2016-11-16 MED ORDER — CIPROFLOXACIN HCL 500 MG PO TABS: 500.0000 mg | ORAL_TABLET | Freq: Two times a day (BID) | ORAL | 0 refills | Status: AC

## 2016-11-16 MED ORDER — CIPROFLOXACIN HCL 500 MG PO TABS
500.0000 mg | ORAL_TABLET | Freq: Once | ORAL | Status: AC
  Administered 2016-11-16: 500 mg via ORAL
  Filled 2016-11-16: qty 1

## 2016-11-16 NOTE — ED Triage Notes (Signed)
C/O dysuria x 3-4 days and this morning right low back and flank started hurting.  Has history of Pyelonephritis.  Has taken Alleve for pain relief, with minimal relief.

## 2016-11-16 NOTE — ED Notes (Signed)
Patient transported to X-ray 

## 2016-11-16 NOTE — ED Provider Notes (Signed)
[facility] Emergency Department Provider Note   ____________________________________________   First MD Initiated Contact with Patient 11/16/16 1640     (approximate)  I have reviewed the triage vital signs and the nursing notes.   HISTORY  Chief Complaint Flank Pain   HPI Brooke Aguirre is a 21 y.o. female with several days of dysuria and right flank pain today. Denies any fevers. Patient says that she has had multiple case of pyelonephritis in the past that felt similar. She says that she has used Cipro in the past which has relieved her symptoms. She denies any history of kidney stones and also no family history of kidney stones. Despite having an extended resistant bug in 2009, the patient says that Cipro has worked for her since then.   Past Medical History:  Diagnosis Date  . Chronic kidney disease    CHRONIC  RIGHT PYELONEPHRITIS  . COPD (chronic obstructive pulmonary disease) (HCC)   . Family history of adverse reaction to anesthesia    PTS MOM WENT INTO COMA AFTER HYSTERECTOMY  . Heart murmur   . Thyroid goiter     Patient Active Problem List   Diagnosis Date Noted  . IUGR (intrauterine growth restriction) 11/28/2015    Past Surgical History:  Procedure Laterality Date  . REMOVAL OF DRUG DELIVERY IMPLANT Left 12/15/2014   Procedure: REMOVAL OF DRUG DELIVERY IMPLANT;  Surgeon: Nadara Mustard, MD;  Location: [facility] ORS;  Service: Gynecology;  Laterality: Left;    Prior to Admission medications   Medication Sig Start Date End Date Taking? Authorizing Provider  PROAIR HFA 108 [id number] Base) MCG/ACT inhaler Inhale 1-2 puffs into the lungs every 6 (six) hours as needed for shortness of breath.  08/14/16  Yes [provider]  levonorgestrel (MIRENA) 20 MCG/24HR IUD 1 each by Intrauterine route once.    [provider]  omeprazole (PRILOSEC) 20 MG capsule Take 1 capsule (20 mg total) by mouth daily. Patient not taking: Reported  on 10/08/2016 09/11/16 10/09/16  Orvil Feil, PA-C    Allergies Kiwi extract; Tramadol; Amoxicillin; Cranberry; Penicillins; and Sulfa antibiotics  No family history on file.  Social History Social History  Substance Use Topics  . Smoking status: Current Every Day Smoker    Packs/day: 0.50    Years: 5.00    Types: Cigarettes  . Smokeless tobacco: Never Used  . Alcohol use No    Review of Systems  Constitutional: No fever/chills Eyes: No visual changes. ENT: No sore throat. Cardiovascular: Denies chest pain. Respiratory: Denies shortness of breath. Gastrointestinal: No abdominal pain.  No nausea, no vomiting.  No diarrhea.  No constipation. Genitourinary: as above Musculoskeletal: as above Skin: Negative for rash. Neurological: Negative for headaches, focal weakness or numbness.   ____________________________________________   PHYSICAL EXAM:  VITAL SIGNS: ED Triage Vitals  Enc Vitals Group     BP 11/16/16 1518 129/81     Pulse Rate 11/16/16 1518 (!) 113     Resp 11/16/16 1518 18     Temp 11/16/16 1518 98.3 F (36.8 C)     Temp Source 11/16/16 1518 Oral     SpO2 11/16/16 1518 99 %     Weight 11/16/16 1518 180 lb (81.6 kg)     Height 11/16/16 1518 4\' 11"  (1.499 m)     Head Circumference --      Peak Flow --      Pain Score 11/16/16 1522 5     Pain Loc --  Pain Edu? --      Excl. in GC? --     Constitutional: Alert and oriented. Well appearing and in no acute distress. Eyes: Conjunctivae are normal. PERRL. EOMI. Head: Atraumatic. Nose: No congestion/rhinnorhea. Mouth/Throat: Mucous membranes are moist.  Neck: No stridor.   Cardiovascular: Normal rate, regular rhythm. Grossly normal heart sounds.   Respiratory: Normal respiratory effort.  No retractions. Lungs CTAB. Gastrointestinal: Soft and nontender. No distention.  Mild left-sided CVA tenderness palpation. Musculoskeletal: No lower extremity tenderness nor edema.  No joint effusions. Neurologic:   Normal speech and language. No gross focal neurologic deficits are appreciated. No gait instability. Skin:  Skin is warm, dry and intact. No rash noted. Psychiatric: Mood and affect are normal. Speech and behavior are normal.  ____________________________________________   LABS (all labs ordered are listed, but only abnormal results are displayed)  Labs Reviewed  CBC - Abnormal; Notable for the following:       Result Value   WBC 19.3 (*)    All other components within normal limits  URINALYSIS, COMPLETE (UACMP) WITH MICROSCOPIC - Abnormal; Notable for the following:    Color, Urine AMBER (*)    APPearance CLOUDY (*)    Hgb urine dipstick MODERATE (*)    Protein, ur 100 (*)    Nitrite POSITIVE (*)    Leukocytes, UA LARGE (*)    Squamous Epithelial / LPF 0-5 (*)    All other components within normal limits  URINE CULTURE  BASIC METABOLIC PANEL  PREGNANCY, URINE   ____________________________________________  EKG   ____________________________________________  RADIOLOGY   ____________________________________________   PROCEDURES  Procedure(s) performed:   Procedures  Critical Care performed:   ____________________________________________   INITIAL IMPRESSION / ASSESSMENT AND PLAN / ED COURSE  Pertinent labs & imaging results that were available during my care of the patient were reviewed by me and considered in my medical decision making (see chart for details).   ----------------------------------------- 5:50 PM on 11/16/2016 -----------------------------------------  Patient with a heart rate of 96 bpm at this time. Nontoxic appearing. Patient with elevated white blood cell count of 19.3. Mild right-sided CVA tenderness palpation. Exam is consistent with pyelonephritis. She says that she cannot get Cipro through the IV because she has had "reactions" to it in the past. However, she says that she can tolerate pills. We will give her first dose before  leaving the emergency department. We discussed return precautions such as any worsening or concerning symptoms, especially fever or worsening pain.      ____________________________________________   FINAL CLINICAL IMPRESSION(S) / ED DIAGNOSES  Pyelonephritis.    NEW MEDICATIONS STARTED DURING THIS VISIT:  New Prescriptions   No medications on file     Note:  This document was prepared using Dragon voice recognition software and may include unintentional dictation errors.      Myrna BlazerSchaevitz, David Matthew, MD 11/16/16 [phone]

## 2016-11-19 LAB — URINE CULTURE: Culture: >=100000 — AB

## 2017-01-22 ENCOUNTER — Emergency Department
Admission: EM | Admit: 2017-01-22 | Discharge: 2017-01-22 | Disposition: A | Discharge status: Home / Self Care | Payer: Medicaid Other | Attending: Student in an Organized Health Care Education/Training Program | Admitting: Student in an Organized Health Care Education/Training Program

## 2017-01-22 ENCOUNTER — Emergency Department: Payer: Medicaid Other

## 2017-01-22 ENCOUNTER — Encounter: Payer: Self-pay | Admitting: *Deleted

## 2017-01-22 DIAGNOSIS — Z7951 Long term (current) use of inhaled steroids: Secondary | ICD-10-CM | POA: Diagnosis not present

## 2017-01-22 DIAGNOSIS — R0602 Shortness of breath: Secondary | ICD-10-CM | POA: Diagnosis not present

## 2017-01-22 DIAGNOSIS — Z793 Long term (current) use of hormonal contraceptives: Secondary | ICD-10-CM | POA: Insufficient documentation

## 2017-01-22 DIAGNOSIS — M7989 Other specified soft tissue disorders: Secondary | ICD-10-CM | POA: Diagnosis not present

## 2017-01-22 DIAGNOSIS — J449 Chronic obstructive pulmonary disease, unspecified: Secondary | ICD-10-CM | POA: Insufficient documentation

## 2017-01-22 DIAGNOSIS — N189 Chronic kidney disease, unspecified: Secondary | ICD-10-CM | POA: Diagnosis not present

## 2017-01-22 DIAGNOSIS — Z79899 Other long term (current) drug therapy: Secondary | ICD-10-CM | POA: Insufficient documentation

## 2017-01-22 DIAGNOSIS — F17210 Nicotine dependence, cigarettes, uncomplicated: Secondary | ICD-10-CM | POA: Diagnosis not present

## 2017-01-22 DIAGNOSIS — R000 Tachycardia, unspecified: Secondary | ICD-10-CM | POA: Insufficient documentation

## 2017-01-22 LAB — BASIC METABOLIC PANEL
Anion gap: 8 (ref 5–15)
BUN: 12 mg/dL (ref 6–20)
CHLORIDE: 107 mmol/L (ref 101–111)
CO2: 22 mmol/L (ref 22–32)
Calcium: 9.1 mg/dL (ref 8.9–10.3)
Creatinine, Ser: 0.65 mg/dL (ref 0.44–1.00)
GFR calc Af Amer: >60 mL/min (ref >60)
GFR calc non Af Amer: >60 mL/min (ref >60)
GLUCOSE: 97 mg/dL (ref 65–99)
Potassium: 3.3 mmol/L — ABNORMAL LOW (ref 3.5–5.1)
Sodium: 137 mmol/L (ref 135–145)

## 2017-01-22 LAB — CBC
HEMATOCRIT: 39.6 % (ref 35.0–47.0)
Hemoglobin: 14.1 g/dL (ref 12.0–16.0)
MCH: 33.7 pg (ref 26.0–34.0)
MCHC: 35.5 g/dL (ref 32.0–36.0)
MCV: 94.8 fL (ref 80.0–100.0)
Platelets: 244 10*3/uL (ref 150–440)
RBC: 4.17 MIL/uL (ref 3.80–5.20)
RDW: 13 % (ref 11.5–14.5)
WBC: 14.4 10*3/uL — AB (ref 3.6–11.0)

## 2017-01-22 LAB — FIBRIN DERIVATIVES D-DIMER (ARMC ONLY): FIBRIN DERIVATIVES D-DIMER (ARMC): 233.75 (ref 0.00–499.00)

## 2017-01-22 LAB — TSH: TSH: 2.637 u[IU]/mL (ref 0.350–4.500)

## 2017-01-22 LAB — POCT PREGNANCY, URINE: Preg Test, Ur: NEGATIVE

## 2017-01-22 MED ORDER — SODIUM CHLORIDE 0.9 % IV BOLUS (SEPSIS)
1000.0000 mL | Freq: Once | INTRAVENOUS | Status: AC
  Administered 2017-01-22: 1000 mL via INTRAVENOUS

## 2017-01-22 NOTE — ED Notes (Signed)
Pt reports she needs to go, pt's IV fluids stopped at . EDP states pt can be discharged.

## 2017-01-22 NOTE — ED Notes (Signed)
Patient transported to Ultrasound 

## 2017-01-22 NOTE — ED Notes (Signed)

## 2017-01-22 NOTE — ED Triage Notes (Signed)
Pt reports bilateral ankle swelling and rapid heart rate in the 120's for past week.  No chest pain or sob.  No pain.  cig smoker. Pt alert

## 2017-01-22 NOTE — ED Provider Notes (Signed)
[facility] Emergency Department Provider Note    First MD Initiated Contact with Patient 01/22/17 1636     (approximate)  I have reviewed the triage vital signs and the nursing notes.   HISTORY  Chief Complaint Leg Swelling and Tachycardia    HPI Brooke Aguirre is a 21 y.o. female who presents from home with chief complaint of worsening left leg swelling associated with tachycardia. Patient states she has been feeling short of breath but does this frequently because she is a heavy smoker. Is on birth control. Does have family history of blood clots. Patient has been checking her heart rate on her phone over the past week and is frequently in the 120s to 130s. Patient went to give plasma today but was denied because her heart rate was 1:30. She denies any active chest pain or pleuritic chest pain. States the swelling is worse when standing. Denies any recent fevers or cough   Past Medical History:  Diagnosis Date  . Chronic kidney disease    CHRONIC  RIGHT PYELONEPHRITIS  . COPD (chronic obstructive pulmonary disease) (HCC)   . Family history of adverse reaction to anesthesia    PTS MOM WENT INTO COMA AFTER HYSTERECTOMY  . Heart murmur   . Thyroid goiter    No family history on file. Past Surgical History:  Procedure Laterality Date  . REMOVAL OF DRUG DELIVERY IMPLANT Left 12/15/2014   Procedure: REMOVAL OF DRUG DELIVERY IMPLANT;  Surgeon: Nadara Mustardobert P Harris, MD;  Location: [facility] ORS;  Service: Gynecology;  Laterality: Left;   Patient Active Problem List   Diagnosis Date Noted  . IUGR (intrauterine growth restriction) 11/28/2015      Prior to Admission medications   Medication Sig Start Date End Date Taking? Authorizing Provider  levonorgestrel (MIRENA) 20 MCG/24HR IUD 1 each by Intrauterine route once.    [provider]  omeprazole (PRILOSEC) 20 MG capsule Take 1 capsule (20 mg total) by mouth daily. Patient not taking: Reported on  10/08/2016 09/11/16 10/09/16  Orvil FeilWoods, Jaclyn M, PA-C  PROAIR HFA 108 [phone](90 Base) MCG/ACT inhaler Inhale 1-2 puffs into the lungs every 6 (six) hours as needed for shortness of breath.  08/14/16   [provider]    Allergies Kiwi extract; Tramadol; Amoxicillin; Cranberry; Penicillins; and Sulfa antibiotics    Social History Social History  Substance Use Topics  . Smoking status: Current Every Day Smoker    Packs/day: 0.50    Years: 5.00    Types: Cigarettes  . Smokeless tobacco: Never Used  . Alcohol use No    Review of Systems Patient denies headaches, rhinorrhea, blurry vision, numbness, shortness of breath, chest pain, edema, cough, abdominal pain, nausea, vomiting, diarrhea, dysuria, fevers, rashes or hallucinations unless otherwise stated above in HPI. ____________________________________________   PHYSICAL EXAM:  VITAL SIGNS: Vitals:   01/22/17 1617 01/22/17 1953  BP: 140/87 121/76  Pulse: (!) 108 90  Resp: 20 18  Temp: 98.7 F (37.1 C)     Constitutional: Alert and oriented. Well appearing and in no acute distress. Eyes: Conjunctivae are normal.  Head: Atraumatic. Nose: No congestion/rhinnorhea. Mouth/Throat: Mucous membranes are moist.   Neck: No stridor. Painless ROM.  Cardiovascular: mildlty tachycardic rate, regular rhythm. Grossly normal heart sounds.  Good peripheral circulation. Respiratory: Normal respiratory effort.  No retractions. Lungs CTAB. Gastrointestinal: Soft and nontender. No distention. No abdominal bruits. No CVA tenderness. Genitourinary:  Musculoskeletal: No lower extremity tenderness, trace LE edema L>R.  No joint  effusions. Neurologic:  Normal speech and language. No gross focal neurologic deficits are appreciated. No facial droop Skin:  Skin is warm, dry and intact. No rash noted. Psychiatric: Mood and affect are normal. Speech and behavior are normal.  ____________________________________________   LABS (all labs ordered are  listed, but only abnormal results are displayed)  Results for orders placed or performed during the hospital encounter of 01/22/17 (from the past 24 hour(s))  Basic metabolic panel     Status: Abnormal   Collection Time: 01/22/17  4:21 PM  Result Value Ref Range   Sodium 137 135 - 145 mmol/L   Potassium 3.3 (L) 3.5 - 5.1 mmol/L   Chloride 107 101 - 111 mmol/L   CO2 22 22 - 32 mmol/L   Glucose, Bld 97 65 - 99 mg/dL   BUN 12 6 - 20 mg/dL   Creatinine, Ser 4.09 0.44 - 1.00 mg/dL   Calcium 9.1 8.9 - 81.1 mg/dL   GFR calc non Af Amer >60 >60 mL/min   GFR calc Af Amer >60 >60 mL/min   Anion gap 8 5 - 15  CBC     Status: Abnormal   Collection Time: 01/22/17  4:21 PM  Result Value Ref Range   WBC 14.4 (H) 3.6 - 11.0 K/uL   RBC 4.17 3.80 - 5.20 MIL/uL   Hemoglobin 14.1 12.0 - 16.0 g/dL   HCT 91.4 78.2 - 95.6 %   MCV 94.8 80.0 - 100.0 fL   MCH 33.7 26.0 - 34.0 pg   MCHC 35.5 32.0 - 36.0 g/dL   RDW 21.3 08.6 - 57.8 %   Platelets 244 150 - 440 K/uL  TSH     Status: None   Collection Time: 01/22/17  4:21 PM  Result Value Ref Range   TSH 2.637 0.350 - 4.500 uIU/mL  Pregnancy, urine POC     Status: None   Collection Time: 01/22/17  4:54 PM  Result Value Ref Range   Preg Test, Ur NEGATIVE NEGATIVE  Fibrin derivatives D-Dimer ([facility] only)     Status: None   Collection Time: 01/22/17  5:01 PM  Result Value Ref Range   Fibrin derivatives D-dimer ([facility]) 233.75 0.00 - 499.00   ____________________________________________  EKG My review and personal interpretation at Time: 16:23   Indication: tachycardia  Rate: 105  Rhythm: sinus Axis: normal Other: no brugada or wpw, normal qt ____________________________________________  RADIOLOGY  I personally reviewed all radiographic images ordered to evaluate for the above acute complaints and reviewed radiology reports and findings.  These findings were personally discussed with the patient.  Please see medical record for radiology  report.  ____________________________________________   PROCEDURES  Procedure(s) performed:  Procedures    Critical Care performed: no ____________________________________________   INITIAL IMPRESSION / ASSESSMENT AND PLAN / ED COURSE  Pertinent labs & imaging results that were available during my care of the patient were reviewed by me and considered in my medical decision making (see chart for details).  DDX: dehydration, dvt, pe, cardiomyopathy, electrolye abn, hyperthyroid  Brooke Aguirre is a 21 y.o. who presents to the ED with symptoms as described above. Patient well-appearing and in no acute distress. Patient able to ambulate and room with no shortness of breath or discomfort. Does have trace lower extremity edema as described above. Clinically have low suspicion for pulmonary embolism with the patient does have tachycardia and risk factors including smoking and hormonal therapy. Patient further risk stratify for pulmonary and was him with a  d-dimer which was negative. Lower extremity duplex shows no evidence of DVT. She has no evidence of Brugada or WPW. Blood work is otherwise reassuring. Patient with mild tachycardia but afebrile. Patient given IV fluids with improvement in heart rate.  TSH is normal. This point as she is well-appearing and in no acute distress and do feel that she is appropriate for follow-up with her PCP for further diagnostic testing.  Have discussed with the patient and available family all diagnostics and treatments performed thus far and all questions were answered to the best of my ability. The patient demonstrates understanding and agreement with plan.       ____________________________________________   FINAL CLINICAL IMPRESSION(S) / ED DIAGNOSES  Final diagnoses:  Tachycardia  Leg swelling      NEW MEDICATIONS STARTED DURING THIS VISIT:  Discharge Medication List as of 01/22/2017  6:32 PM       Note:  This document was prepared  using Dragon voice recognition software and may include unintentional dictation errors.    Willy Eddy, MD 01/22/17 2056

## 2017-05-05 ENCOUNTER — Emergency Department
Admission: EM | Admit: 2017-05-05 | Discharge: 2017-05-05 | Disposition: A | Discharge status: Home / Self Care | Payer: Medicaid Other | Attending: Emergency Medicine | Admitting: Emergency Medicine

## 2017-05-05 ENCOUNTER — Encounter: Payer: Self-pay | Admitting: Emergency Medicine

## 2017-05-05 DIAGNOSIS — Z5321 Procedure and treatment not carried out due to patient leaving prior to being seen by health care provider: Secondary | ICD-10-CM | POA: Diagnosis not present

## 2017-05-05 DIAGNOSIS — R070 Pain in throat: Secondary | ICD-10-CM | POA: Diagnosis present

## 2017-05-05 LAB — POCT RAPID STREP A: STREPTOCOCCUS, GROUP A SCREEN (DIRECT): NEGATIVE

## 2017-05-05 NOTE — ED Notes (Addendum)
Pt states she has to go back to work and will be leaving ED. No distress noted at this time.

## 2017-05-05 NOTE — ED Triage Notes (Signed)
Pt c/o sore throat x2 weeks unresolved by OTC meds. Pt denies fever or SOB.

## 2017-05-07 LAB — CULTURE, GROUP A STREP (THRC)

## 2017-08-19 ENCOUNTER — Emergency Department
Admission: EM | Admit: 2017-08-19 | Discharge: 2017-08-19 | Disposition: A | Discharge status: Home / Self Care | Payer: Medicaid Other | Attending: Emergency Medicine | Admitting: Emergency Medicine

## 2017-08-19 ENCOUNTER — Other Ambulatory Visit: Payer: Self-pay

## 2017-08-19 ENCOUNTER — Encounter: Payer: Self-pay | Admitting: Emergency Medicine

## 2017-08-19 DIAGNOSIS — N189 Chronic kidney disease, unspecified: Secondary | ICD-10-CM | POA: Insufficient documentation

## 2017-08-19 DIAGNOSIS — Z79899 Other long term (current) drug therapy: Secondary | ICD-10-CM | POA: Insufficient documentation

## 2017-08-19 DIAGNOSIS — J449 Chronic obstructive pulmonary disease, unspecified: Secondary | ICD-10-CM | POA: Insufficient documentation

## 2017-08-19 DIAGNOSIS — J039 Acute tonsillitis, unspecified: Secondary | ICD-10-CM | POA: Insufficient documentation

## 2017-08-19 DIAGNOSIS — F17210 Nicotine dependence, cigarettes, uncomplicated: Secondary | ICD-10-CM | POA: Insufficient documentation

## 2017-08-19 LAB — GROUP A STREP BY PCR: Group A Strep by PCR: NOT DETECTED

## 2017-08-19 LAB — MONONUCLEOSIS SCREEN: MONO SCREEN: NEGATIVE

## 2017-08-19 MED ORDER — LIDOCAINE VISCOUS 2 % MT SOLN
10.0000 mL | OROMUCOSAL | 0 refills | Status: DC | PRN
Start: 2017-08-19 — End: 2018-02-25

## 2017-08-19 MED ORDER — CLINDAMYCIN HCL 300 MG PO CAPS: 300.0000 mg | ORAL_CAPSULE | Freq: Three times a day (TID) | ORAL | 0 refills | Status: AC

## 2017-08-19 MED ORDER — LIDOCAINE VISCOUS 2 % MT SOLN
15.0000 mL | Freq: Once | OROMUCOSAL | Status: AC
Start: 2017-08-19 — End: 2017-08-19
  Administered 2017-08-19: 15 mL via OROMUCOSAL
  Filled 2017-08-19: qty 15

## 2017-08-19 MED ORDER — DEXAMETHASONE SODIUM PHOSPHATE 10 MG/ML IJ SOLN
10.0000 mg | Freq: Once | INTRAMUSCULAR | Status: AC
  Administered 2017-08-19: 10 mg via INTRAMUSCULAR
  Filled 2017-08-19: qty 1

## 2017-08-19 NOTE — ED Triage Notes (Addendum)
Started with cough yesterday and now c/o sore throat and ear pain. No known fever. Unlabored. Tonsils swollen with exudate

## 2017-08-19 NOTE — ED Provider Notes (Signed)
[facility] Emergency Department Provider Note  ____________________________________________  Time seen: Approximately 2:57 PM  I have reviewed the triage vital signs and the nursing notes.   HISTORY  Chief Complaint Sore Throat    HPI Brooke Aguirre is a 22 y.o. female that presents the emergency department for evaluation of chills, sore throat and bilateral ear pain since yesterday.  She is unsure of fever.  She has had tonsillitis several times in the past. No nasal congestion, shortness of breath, chest pain, nausea, vomiting, abdominal pain.  Past Medical History:  Diagnosis Date  . Chronic kidney disease    CHRONIC  RIGHT PYELONEPHRITIS  . COPD (chronic obstructive pulmonary disease) (HCC)   . Family history of adverse reaction to anesthesia    PTS MOM WENT INTO COMA AFTER HYSTERECTOMY  . Heart murmur   . Thyroid goiter     Patient Active Problem List   Diagnosis Date Noted  . IUGR (intrauterine growth restriction) 11/28/2015    Past Surgical History:  Procedure Laterality Date  . REMOVAL OF DRUG DELIVERY IMPLANT Left 12/15/2014   Procedure: REMOVAL OF DRUG DELIVERY IMPLANT;  Surgeon: Nadara Mustardobert P Harris, MD;  Location: [facility] ORS;  Service: Gynecology;  Laterality: Left;    Prior to Admission medications   Medication Sig Start Date End Date Taking? Authorizing Provider  clindamycin (CLEOCIN) 300 MG capsule Take 1 capsule (300 mg total) by mouth 3 (three) times daily for 10 days. 08/19/17 08/29/17  Enid DerryWagner, Carola Viramontes, PA-C  levonorgestrel (MIRENA) 20 MCG/24HR IUD 1 each by Intrauterine route once.    [provider]  lidocaine (XYLOCAINE) 2 % solution Use as directed 10 mLs in the mouth or throat as needed for mouth pain. 08/19/17   Enid DerryWagner, Gerald Kuehl, PA-C  omeprazole (PRILOSEC) 20 MG capsule Take 1 capsule (20 mg total) by mouth daily. Patient not taking: Reported on 10/08/2016 09/11/16 10/09/16  Orvil FeilWoods, Jaclyn M, PA-C  PROAIR HFA 108 [phone](90 Base) MCG/ACT  inhaler Inhale 1-2 puffs into the lungs every 6 (six) hours as needed for shortness of breath.  08/14/16   [provider]    Allergies Kiwi extract; Tramadol; Amoxicillin; Cranberry; Penicillins; and Sulfa antibiotics  History reviewed. No pertinent family history.  Social History Social History   Tobacco Use  . Smoking status: Current Every Day Smoker    Packs/day: 0.50    Years: 5.00    Pack years: 2.50    Types: Cigarettes  . Smokeless tobacco: Never Used  Substance Use Topics  . Alcohol use: No  . Drug use: No     Review of Systems  Eyes: No visual changes. No discharge. ENT: Negative for congestion and rhinorrhea. Cardiovascular: No chest pain. Respiratory: Negative for cough. No SOB. Gastrointestinal: No abdominal pain.  No nausea, no vomiting.  No diarrhea.  No constipation. Musculoskeletal: Negative for musculoskeletal pain. Skin: Negative for rash, abrasions, lacerations, ecchymosis.   ____________________________________________   PHYSICAL EXAM:  VITAL SIGNS: ED Triage Vitals  Enc Vitals Group     BP 08/19/17 1324 (!) 141/68     Pulse Rate 08/19/17 1324 (!) 115     Resp 08/19/17 1324 20     Temp 08/19/17 1323 98.5 F (36.9 C)     Temp Source 08/19/17 1324 Oral     SpO2 08/19/17 1324 100 %     Weight 08/19/17 1324 180 lb (81.6 kg)     Height 08/19/17 1324 4\' 11"  (1.499 m)     Head Circumference --  Peak Flow --      Pain Score 08/19/17 1324 10     Pain Loc --      Pain Edu? --      Excl. in GC? --      Constitutional: Alert and oriented. Well appearing and in no acute distress. Eyes: Conjunctivae are normal. PERRL. EOMI. No discharge. Head: Atraumatic. ENT: No frontal and maxillary sinus tenderness.      Ears: Tympanic membranes pearly gray with good landmarks. No discharge.      Nose: Mild congestion/rhinnorhea.      Mouth/Throat: Mucous membranes are moist. Oropharynx erythematous. Tonsils enlarged 1+ bilaterally with exudates.  Uvula midline. Neck: No stridor.   Hematological/Lymphatic/Immunilogical: No cervical lymphadenopathy. Cardiovascular: Normal rate, regular rhythm.  Good peripheral circulation. Respiratory: Normal respiratory effort without tachypnea or retractions. Lungs CTAB. Good air entry to the bases with no decreased or absent breath sounds. Gastrointestinal: Bowel sounds 4 quadrants. Soft and nontender to palpation. No guarding or rigidity. No palpable masses. No distention. Musculoskeletal: Full range of motion to all extremities. No gross deformities appreciated. Neurologic:  Normal speech and language. No gross focal neurologic deficits are appreciated.  Skin:  Skin is warm, dry and intact. No rash noted.   ____________________________________________   LABS (all labs ordered are listed, but only abnormal results are displayed)  Labs Reviewed  GROUP A STREP BY PCR  MONONUCLEOSIS SCREEN   ____________________________________________  EKG   ____________________________________________  RADIOLOGY   No results found.  ____________________________________________    PROCEDURES  Procedure(s) performed:    Procedures    Medications  dexamethasone (DECADRON) injection 10 mg (10 mg Intramuscular Given 08/19/17 1556)  lidocaine (XYLOCAINE) 2 % viscous mouth solution 15 mL (15 mLs Mouth/Throat Given 08/19/17 1556)     ____________________________________________   INITIAL IMPRESSION / ASSESSMENT AND PLAN / ED COURSE  Pertinent labs & imaging results that were available during my care of the patient were reviewed by me and considered in my medical decision making (see chart for details).  Review of the  [place]RS was performed in accordance of the [facility] prior to dispensing any controlled drugs.   Patient's diagnosis is consistent with tonsillitis. Vital signs and exam are reassuring.  Strep and mono are negative.  Patient appears well and is staying well hydrated. Patient should  alternate tylenol and ibuprofen for fever. Patient feels comfortable going home.  IM Decadron was given.  She has had tonsillitis several times in the past.  Patient will be discharged home with prescriptions for clindamycin and viscous lidocaine. Patient is to follow up with PCP as needed or otherwise directed. Patient is given ED precautions to return to the ED for any worsening or new symptoms.     ____________________________________________  FINAL CLINICAL IMPRESSION(S) / ED DIAGNOSES  Final diagnoses:  Tonsillitis      NEW MEDICATIONS STARTED DURING THIS VISIT:  ED Discharge Orders        Ordered    clindamycin (CLEOCIN) 300 MG capsule  3 times daily     08/19/17 1551    lidocaine (XYLOCAINE) 2 % solution  As needed     08/19/17 1551          This chart was dictated using voice recognition software/Dragon. Despite best efforts to proofread, errors can occur which can change the meaning. Any change was purely unintentional.    Enid Derry, PA-C 08/19/17 1611    Pershing Proud Myra Rude, MD 08/20/17 [phone]

## 2017-08-19 NOTE — ED Triage Notes (Signed)
Pt repots flu like symptoms since yesterday.

## 2018-02-25 ENCOUNTER — Ambulatory Visit: Payer: Medicaid Other | Admitting: Obstetrics and Gynecology

## 2018-02-25 ENCOUNTER — Other Ambulatory Visit (HOSPITAL_COMMUNITY)
Admission: RE | Admit: 2018-02-25 | Discharge: 2018-02-25 | Disposition: A | Discharge status: Home / Self Care | Payer: Medicaid Other | Source: Ambulatory Visit | Attending: Obstetrics and Gynecology | Admitting: Obstetrics and Gynecology

## 2018-02-25 ENCOUNTER — Encounter: Payer: Self-pay | Admitting: Obstetrics and Gynecology

## 2018-02-25 VITALS — BP 124/75 | HR 99 | Ht 60.0 in | Wt 207.1 lb

## 2018-02-25 DIAGNOSIS — E669 Obesity, unspecified: Secondary | ICD-10-CM | POA: Diagnosis not present

## 2018-02-25 DIAGNOSIS — R87612 Low grade squamous intraepithelial lesion on cytologic smear of cervix (LGSIL): Secondary | ICD-10-CM

## 2018-02-25 DIAGNOSIS — N871 Moderate cervical dysplasia: Secondary | ICD-10-CM | POA: Insufficient documentation

## 2018-02-25 DIAGNOSIS — E66811 Obesity, class 1: Secondary | ICD-10-CM | POA: Insufficient documentation

## 2018-02-25 DIAGNOSIS — Z720 Tobacco use: Secondary | ICD-10-CM | POA: Diagnosis not present

## 2018-02-25 MED ORDER — METRONIDAZOLE 0.75 % VA GEL: 1.0000 | Freq: Every day | VAGINAL | 0 refills | Status: DC

## 2018-02-25 NOTE — Patient Instructions (Addendum)
1.  Colposcopy with biopsies is performed today. 2.  Smoking cessation is encouraged 3.  Condom use is encouraged, especially with new partners 4.  Return in 6 months for repeat colposcopy 5.  MetroGel for bacterial vaginosis as prescribed today. Human Papillomavirus Human papillomavirus (HPV) is the most common sexually transmitted infection (STI). It is easy to pass it from person to person (contagious). HPV can cause cervical cancer, anal cancer, and genital warts. The genital warts can be seen and felt. Also, there may be wartlike regions in the throat. HPV may not have any symptoms. It is possible to have HPV for a long time and not know it. You may pass HPV on to others without knowing it. Follow these instructions at home:  Take medicines as told by your doctor.  Use over-the-counter creams for itching as told by your doctor.  Keep all follow-up visits. Make sure to get Pap tests as told by your doctor.  Do not touch or scratch the warts.  Do not treat genital warts with medicines used for treating hand warts.  Do not have sex while you are getting treatment.  Do not douche or use tampons during treatment of HPV.  Tell your sex partner about your infection because he or she may also need treatment.  If you get pregnant, tell your doctor that you had HPV. Your doctor will watch your pregnancy closely. This is important to keep your baby safe.  After treatment, use condoms during sex to prevent future infections.  Have only one sex partner.  Have a sex partner who does not have other sex partners. Contact a doctor if:  The treated skin is red, swollen, or painful.  You have a fever.  You feel ill.  You feel lumps or pimple-like areas in and around your genital area.  You have bleeding of the vagina or the area that was treated.  You have pain during sex. This information is not intended to replace advice given to you by your health care provider. Make sure you  discuss any questions you have with your health care provider. Document Released: 06/12/2008 Document Revised: 12/06/2015 Document Reviewed: 10/05/2013 Elsevier Interactive Patient Education  2017 [employer]    Cervical Biopsy, Care After Refer to this sheet in the next few weeks. These instructions provide you with information about caring for yourself after your procedure. Your health care provider may also give you more specific instructions. Your treatment has been planned according to current medical practices, but problems sometimes occur. Call your health care provider if you have any problems or questions after your procedure. What can I expect after the procedure? After the procedure, it is common to have:  Cramping or mild pain for a few days.  Slight bleeding from the vagina for a few days.  Dark-colored vaginal discharge for a few days.  Follow these instructions at home:  Take over-the-counter and prescription medicines only as told by your health care provider.  Return to your normal activities as told by your health care provider. Ask your health care provider what activities are safe for you.  Use a sanitary napkin until bleeding and discharge stop.  Do not use tampons until your health care provider approves.  Do not douche until your health care provider approves.  Do not have sex until your health care provider approves.  Keep all follow-up visits as told by your health care provider. This is important. Contact a health care provider if:  You have a  fever or chills.  You have bad-smelling vaginal discharge.  You have itching or irritation around the vagina.  You have lower abdominal pain. Get help right away if:  You develop heavy vaginal bleeding that soaks more than one sanitary pad an hour.  You faint.  You have very bad lower abdominal pain. This information is not intended to replace advice given to you by your health care provider. Make  sure you discuss any questions you have with your health care provider. Document Released: 03/21/2015 Document Revised: 12/06/2015 Document Reviewed: 11/15/2014 Elsevier Interactive Patient Education  2018 [employer]

## 2018-02-25 NOTE — Progress Notes (Signed)
GYN ENCOUNTER NOTE  Subjective:       Brooke Aguirre is a 22 y.o. [id number] female is here for gynecologic evaluation of the following issues:  1.  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  Bacterial vaginosis  First abnormal Pap smear.  No abnormal bleeding. ParaGard IUD for contraception; menses are heavy and crampy Diagnosed with bacterial vaginosis; did not complete Flagyl therapy due to nausea and vomiting.  Partner history: Multiple; currently monogamous for the past 2 years Tobacco use history: POSITIVE STD history: Negative   Gynecologic History Patient's last menstrual period was 01/28/2018 (approximate). Contraception: ParaGard IUD Last Pap: 02/10/2018 LGSIL/HPV 16-/HPV 18- Last mammogram: N/A  Obstetric History OB History  Gravida Para Term Preterm AB Living  2 2 1     2   SAB TAB Ectopic Multiple Live Births        0 2    # Outcome Date GA Lbr Len/2nd Weight Sex Delivery Anes PTL Lv  2 Term 11/30/15 [id number] / 00:08 4 lb 15 oz (2.24 kg) F Vag-Spont EPI  LIV  1 Para     F Vag-Spont  N LIV    Past Medical History:  Diagnosis Date  . Abnormal Pap smear of cervix   . Chronic kidney disease    CHRONIC  RIGHT PYELONEPHRITIS  . COPD (chronic obstructive pulmonary disease) (HCC)   . Family history of adverse reaction to anesthesia    PTS MOM WENT INTO COMA AFTER HYSTERECTOMY  . Heart murmur   . Thyroid goiter     Past Surgical History:  Procedure Laterality Date  . REMOVAL OF DRUG DELIVERY IMPLANT Left 12/15/2014   Procedure: REMOVAL OF DRUG DELIVERY IMPLANT;  Surgeon: Nadara Mustardobert P Harris, MD;  Location: [facility] ORS;  Service: Gynecology;  Laterality: Left;    Current Outpatient Medications on File Prior to Visit  Medication Sig Dispense Refill  . PARAGARD INTRAUTERINE COPPER IU by Intrauterine route. Inserted 01/2016 - [facility]     No current facility-administered medications on file prior to visit.     Allergies  Allergen Reactions  . Kiwi Extract Anaphylaxis   . Tramadol   . Amoxicillin Hives, Other (See Comments) and Rash    blisters  . Cranberry Rash  . Penicillins Rash    Has patient had a PCN reaction causing immediate rash, facial/tongue/throat swelling, SOB or lightheadedness with hypotension:no Has patient had a PCN reaction causing severe rash involving mucus membranes or skin necrosis: yes Has patient had a PCN reaction that required hospitalization no Has patient had a PCN reaction occurring within the last 10 years: yes If all of the above answers are "NO", then may proceed with Cephalosporin use.   . Sulfa Antibiotics Rash and Other (See Comments)    BLISTERS    Social History   Socioeconomic History  . Marital status: Single    Spouse name: Not on file  . Number of children: Not on file  . Years of education: Not on file  . Highest education level: Not on file  Occupational History  . Not on file  Social Needs  . Financial resource strain: Not on file  . Food insecurity:    Worry: Not on file    Inability: Not on file  . Transportation needs:    Medical: Not on file    Non-medical: Not on file  Tobacco Use  . Smoking status: Current Every Day Smoker    Packs/day: 0.50    Years: 5.00  Pack years: 2.50    Types: Cigarettes  . Smokeless tobacco: Never Used  Substance and Sexual Activity  . Alcohol use: No  . Drug use: No  . Sexual activity: Yes    Birth control/protection: IUD  Lifestyle  . Physical activity:    Days per week: Not on file    Minutes per session: Not on file  . Stress: Not on file  Relationships  . Social connections:    Talks on phone: Not on file    Gets together: Not on file    Attends religious service: Not on file    Active member of club or organization: Not on file    Attends meetings of clubs or organizations: Not on file    Relationship status: Not on file  . Intimate partner violence:    Fear of current or ex partner: Not on file    Emotionally abused: Not on file     Physically abused: Not on file    Forced sexual activity: Not on file  Other Topics Concern  . Not on file  Social History Narrative  . Not on file    Family History  Problem Relation Age of Onset  . Breast cancer Neg Hx   . Ovarian cancer Neg Hx   . Colon cancer Neg Hx     The following portions of the patient's history were reviewed and updated as appropriate: allergies, current medications, past family history, past medical history, past social history, past surgical history and problem list.  Review of Systems No intermenstrual bleeding. No pain with intercourse. No history of STD. Tobacco user No chronic cough or bronchitis No UTI symptoms Vaginal discharge  Objective:   BP 124/75   Pulse 99   Ht 5' (1.524 m)   Wt 207 lb 1.6 oz (93.9 kg)   LMP 01/28/2018 (Approximate)   Breastfeeding? No   BMI 40.45 kg/m  CONSTITUTIONAL: Well-developed, well-nourished female in no acute distress.  HENT:  Normocephalic, atraumatic.  NECK:  SKIN: Skin is warm and dry. No rash noted. Not diaphoretic. No erythema. No pallor. NEUROLGIC: Alert and oriented to person, place, and time.  PSYCHIATRIC: Normal mood and affect. Normal behavior. Normal judgment and thought content. CARDIOVASCULAR:Not Examined RESPIRATORY: Not Examined BREASTS: Not Examined ABDOMEN: Soft, non distended; Non tender.  No Organomegaly. PELVIC:  External Genitalia: Normal  BUS: Normal  Vagina: Minimal secretions; vaginal vault slightly narrowed; no malodor  Cervix: Normal; no lesions; no discharge; no cervical motion tenderness; IUD strings 3 cm long  Uterus: Not examined  Adnexa: Not examined  RV: Normal external exam  Bladder: Nontender MUSCULOSKELETAL: Normal range of motion. No tenderness.  No cyanosis, clubbing, or edema.  Several pustules noted on inner thighs bilaterally  PROCEDURE: Colposcopy of cervix and upper adjacent vagina with cervical biopsies and ECC Indications: LGSIL Pap smear Findings:  SCJ not fully visualized; acetowhite epithelium noted at 11:00 and 12:00; normal vagina Description: Verbal consent is obtained.  Patient is placed in dorsolithotomy position.  Peterson speculum was placed into the vagina to facilitate visualization of the cervix and upper adjacent vagina.  Colposcopic evaluation was challenging due to the narrowed vaginal walls and high position of the cervix within the vaginal vault.  The vagina was painted with Fox swabs soaked in acetic acid.  Colposcopy is performed with visualization of acetowhite epithelium at 11 12:00.  The SCJ could not be visualized.  ECC was performed with serrated curette.  Cervical biopsies at 11:00 and 12:00 are obtained  with tissular biopsy forceps.  Monsel solution is applied to the biopsy sites.  Bleeding is minimal.  Procedure is well-tolerated.   Assessment:   1. LGSIL on Pap smear of cervix  2. Tobacco user  3. Obesity (BMI 30.0-34.9)  4.  BV, untreated     Plan:   1.  Coloscopy with ECC and cervical biopsies (11:00 and 12:00) 2.  MetroGel intravaginal x5 days 3.  Smoking cessation strongly encouraged 4.  Condom use encouraged 5.  Patient is to check on HPV vaccine status 6.  Return in 6 months for repeat colposcopy 7.  Pathophysiology of HPV and its management and treatment were reviewed.

## 2018-03-01 ENCOUNTER — Encounter: Payer: Self-pay | Admitting: Obstetrics and Gynecology

## 2018-03-03 ENCOUNTER — Encounter: Payer: Self-pay | Admitting: Obstetrics and Gynecology

## 2018-03-03 ENCOUNTER — Ambulatory Visit (INDEPENDENT_AMBULATORY_CARE_PROVIDER_SITE_OTHER): Payer: Medicaid Other | Admitting: Obstetrics and Gynecology

## 2018-03-03 VITALS — BP 118/77 | HR 102 | Ht 60.0 in | Wt 205.9 lb

## 2018-03-03 DIAGNOSIS — N871 Moderate cervical dysplasia: Secondary | ICD-10-CM

## 2018-03-03 NOTE — Patient Instructions (Signed)
1.  LEEP cone biopsy is to be scheduled today for 6 to 8 weeks in the future. 2.  Return in the week before surgery for preop appointment   (LEEP) Cervical Conization Cervical conization (cone biopsy) is a procedure in which a cone-shaped portion of the cervix is cut out so that it can be examined under a microscope. The procedure is done to check for cancer cells or cells that might turn into cancer (precancerous cells). You may have this procedure if:  You have abnormal bleeding from your cervix.  You had an abnormal Pap test.  Something abnormal was seen on your cervix during an exam.  This procedure is performed in either a health care provider's office or in an operating room. Tell a health care provider about:  Any allergies you have.  All medicines you are taking, including vitamins, herbs, eye drops, creams, and over-the-counter medicines.  Any problems you or family members have had with the use of anesthetic medicines.  Any blood disorders you have.  Any surgeries you have had.  Any medical conditions you have.  Your smoking habits.  When you normally have your period.  Whether you are pregnant or may be pregnant. What are the risks? Generally, this is a safe procedure. However, problems may occur, including:  Heavy bleeding for several days or weeks after the procedure.  Allergic reactions to medicines or dyes.  Increased risk of preterm labor in future pregnancies.  Infection (rare).  Damage to the cervix or other structures or organs (rare).  What happens before the procedure? Staying hydrated Follow instructions from your health care provider about hydration, which may include:  Up to 2 hours before the procedure - you may continue to drink clear liquids, such as water, clear fruit juice, black coffee, and plain tea.  Eating and drinking restrictions Follow instructions from your health care provider about eating and drinking, which may include:  8  hours before the procedure - stop eating heavy meals or foods such as meat, fried foods, or fatty foods.  6 hours before the procedure - stop eating light meals or foods, such as toast or cereal.  6 hours before the procedure - stop drinking milk or drinks that contain milk.  2 hours before the procedure - stop drinking clear liquids.  General instructions  Do not douche, have sex, use tampons, or use any vaginal medicines before the procedure as told by your health care provider.  You may be asked to empty your bladder and bowel right before the procedure.  Ask your health care provider about: ? Changing or stopping your normal medicines. This is important if you take diabetes medicines or blood thinners. ? Taking medicines such as aspirin and ibuprofen. These medicines can thin your blood. Do not take these medicines before your procedure if your doctor tells you not to.  Plan to have someone take you home from the hospital or clinic. What happens during the procedure?  To reduce your risk of infection: ? Your health care team will wash or sanitize their hands. ? Your skin will be washed with soap. ? Hair may be removed from the surgical area.  You will undress from the waist down and be given a gown to wear.  You will lie on an examining table and put your feet in stirrups.  An IV tube will be inserted into one of your veins.  You will be given one or more of the following: ? A medicine to help  you relax (sedative). ? A medicine to numb the area (local anesthetic). ? A medicine to make you fall asleep (general anesthetic). ? A medicine that numbs the cervix (cervical block).  A lubricated device called a speculum will be inserted into your vagina. It will be used to spread open the walls of the vagina so your health care provider can see the inside of the vagina and cervix better.  An instrument that has a magnifying lens and a light (colposcope) will let your health care  provider examine the cervix more closely.  Your health care provider will apply a solution to your cervix. This turns abnormal areas a pale color.  A tissue sample will be removed from the cervix using one of the following methods: ? The cold knife method. In this method, the tissue is cut out with a knife (scalpel). ? The loop electrosurgical excision procedure (LEEP) method. In this method, the tissue is cut out with a thin wire that can burn (cauterize) the tissue with an electrical current. ? Laser treatment method. In this method, the tissue is cut out and then cauterized with a laser beam to prevent bleeding.  Your health care provider will apply a paste over the biopsy areas to help control bleeding.  The tissue sample will be examined under a microscope. The procedure may vary among health care providers and hospitals. What happens after the procedure?  Your blood pressure, heart rate, breathing rate, and blood oxygen level will be monitored often until the medicines you were given have worn off.  If you were given a local anesthetic, you will rest at the clinic or hospital until you are stable and feel ready to go home.  If you were given a general anesthetic, you may be monitored for a longer period of time.  You may have some cramping.  You may have bloody discharge or light to moderate bleeding.  You may have dark discharge coming from your vagina. This is from the paste used on the cervix to prevent bleeding. Summary  Cervical conization is a procedure in which a cone-shaped portion of the cervix is cut out so that it can be examined under a microscope.  The procedure is done to check for cancer cells or cells that might turn into cancer (precancerous cells). This information is not intended to replace advice given to you by your health care provider. Make sure you discuss any questions you have with your health care provider. Document Released: 04/09/2005 Document  Revised: 07/02/2016 Document Reviewed: 07/02/2016 Elsevier Interactive Patient Education  2017 [employer]

## 2018-03-03 NOTE — Progress Notes (Signed)
Chief complaint: 1.  High-grade dysplasia 2.  Conference regarding colposcopic directed biopsies results 3.  Tobacco user  Pathology-colposcopic directed biopsies 02/25/2018: Diagnosis 1. Endocervix, curettage - SCANT STRIPS OF BENIGN CERVICAL MUCOSA. - NEGATIVE FOR CARCINOMA. 2. Cervix, biopsy, at 11 o'clock - HIGH GRADE SQUAMOUS INTRAEPITHELIAL LESION (HSIL, CIN-II). 3. Cervix, biopsy, at 12 o'clock - LOW GRADE SQUAMOUS INTRAEPITHELIAL LESION (LSIL, CIN-I).  Colposcopy was notable for a difficult technical procedure due to narrow vagina and cervix situated high within the vaginal vault, and associated redundant vaginal sidewalls. Colposcopy was inadequate as the SCJ could not be fully visualized. Biopsy results show high-grade dysplasia consistent with CIN-2 at 11:00; low-grade dysplasia consistent with CIN-1 at 12:00.  IUD strings are visible and measuring 3 cm in length.  Because of these findings, further treatment is recommended in the form of LEEP cone biopsy to be done in same-day surgery.  Description of the surgery, pre-and post surgical preparation, and recovery issues were discussed in detail.  Plan is to work with the IUD in place because of the effectiveness of the contraceptive method for this patient.  ASSESSMENT: 1.  CIN-2 of the cervix 2.  Tobacco user 3.  Inadequate colposcopy with inability to visualize the SCJ 4.  ParaGard IUD-effective and desires to be continued-insertion 2017  PLAN: 1.  LEEP cone biopsy of the cervix is scheduled for same-day surgery within 6 to 8 weeks 2.  Management options were discussed 3.  Perioperative issues were discussed. 4.  Return week before surgery for preop appointment  A total of 15 minutes were spent face-to-face with the patient during this encounter and over half of that time dealt with counseling and coordination of care.  Herold HarmsMartin A Makenlee Mckeag, MD  Note: This dictation was prepared with Dragon dictation along with smaller  phrase technology. Any transcriptional errors that result from this process are unintentional.

## 2018-04-26 ENCOUNTER — Emergency Department
Admission: EM | Admit: 2018-04-26 | Discharge: 2018-04-26 | Disposition: A | Discharge status: Home / Self Care | Payer: Medicaid Other | Attending: Emergency Medicine | Admitting: Emergency Medicine

## 2018-04-26 ENCOUNTER — Encounter: Payer: Self-pay | Admitting: Emergency Medicine

## 2018-04-26 ENCOUNTER — Other Ambulatory Visit: Payer: Self-pay

## 2018-04-26 DIAGNOSIS — B9789 Other viral agents as the cause of diseases classified elsewhere: Secondary | ICD-10-CM | POA: Insufficient documentation

## 2018-04-26 DIAGNOSIS — Z8679 Personal history of other diseases of the circulatory system: Secondary | ICD-10-CM | POA: Insufficient documentation

## 2018-04-26 DIAGNOSIS — J449 Chronic obstructive pulmonary disease, unspecified: Secondary | ICD-10-CM | POA: Insufficient documentation

## 2018-04-26 DIAGNOSIS — J069 Acute upper respiratory infection, unspecified: Secondary | ICD-10-CM | POA: Insufficient documentation

## 2018-04-26 DIAGNOSIS — N189 Chronic kidney disease, unspecified: Secondary | ICD-10-CM | POA: Insufficient documentation

## 2018-04-26 DIAGNOSIS — F17210 Nicotine dependence, cigarettes, uncomplicated: Secondary | ICD-10-CM | POA: Insufficient documentation

## 2018-04-26 MED ORDER — PSEUDOEPH-BROMPHEN-DM 30-2-10 MG/5ML PO SYRP: 5.0000 mL | ORAL_SOLUTION | Freq: Four times a day (QID) | ORAL | 0 refills | Status: DC | PRN

## 2018-04-26 NOTE — ED Triage Notes (Signed)
Patient states she has had treated for a "bad cold with fever" with a Z-Pak last Monday.  Sore throat resolved but now she has a cough with green phlegm "it tastes really bad".  Patient is scheduled for surgery next Monday.

## 2018-04-26 NOTE — ED Notes (Signed)
See triage note  Presents with a 2 week hx of cold sx's   Cough  Runny nose and sore throat  Was seen by her PCP and placed on z-pak  Sore throat has stopped but conts with cough  States cough is prod

## 2018-04-26 NOTE — Discharge Instructions (Signed)
Follow-up with your primary care provider if any continued problems.  Begin taking Bromfed as directed for cough and congestion.  Increase fluids.  You may also take Tylenol or ibuprofen as needed for throat pain, ear pain or aching.  Decrease or discontinue smoking as this will help.

## 2018-04-26 NOTE — ED Provider Notes (Signed)
[facility] Emergency Department Provider Note  ____________________________________________   First MD Initiated Contact with Patient 04/26/18 0920     (approximate)  I have reviewed the triage vital signs and the nursing notes.   HISTORY  Chief Complaint Cough   HPI Brooke Aguirre is a 22 y.o. female presents to the ED with complaint of cough and congestion for the last 2 weeks.  Patient states that she was seen by her PCP and placed on Z-Pak which did not help.  She states that today the cough is productive.  Patient denies any history of asthma and continues to smoke.  She has not taken any other over-the-counter medications.  Past Medical History:  Diagnosis Date  . Abnormal Pap smear of cervix   . Chronic kidney disease    CHRONIC  RIGHT PYELONEPHRITIS  . COPD (chronic obstructive pulmonary disease) (HCC)   . Family history of adverse reaction to anesthesia    PTS MOM WENT INTO COMA AFTER HYSTERECTOMY  . Heart murmur   . Thyroid goiter     Patient Active Problem List   Diagnosis Date Noted  . 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 Tobacco user 02/25/2018  . Obesity (BMI 30.0-34.9) 02/25/2018    Past Surgical History:  Procedure Laterality Date  . REMOVAL OF DRUG DELIVERY IMPLANT Left 12/15/2014   Procedure: REMOVAL OF DRUG DELIVERY IMPLANT;  Surgeon: Nadara Mustard, MD;  Location: [facility] ORS;  Service: Gynecology;  Laterality: Left;    Prior to Admission medications   Medication Sig Start Date End Date Taking? Authorizing Provider  brompheniramine-pseudoephedrine-DM 30-2-10 MG/5ML syrup Take 5 mLs by mouth 4 (four) times daily as needed. 04/26/18   Tommi Rumps, PA-C  MELATONIN-CHAMOMILE PO Take 1 tablet by mouth at bedtime.    [provider]  PARAGARD INTRAUTERINE COPPER IU 1 each by Intrauterine route once. Inserted 01/2016 - [facility]     [provider]    Allergies Kiwi extract; Tramadol;  Amoxicillin; Cranberry; Penicillins; and Sulfa antibiotics  Family History  Problem Relation Age of Onset  . Breast cancer Neg Hx   . Ovarian cancer Neg Hx   . Colon cancer Neg Hx     Social History Social History   Tobacco Use  . Smoking status: Current Every Day Smoker    Packs/day: 0.50    Years: 5.00    Pack years: 2.50    Types: Cigarettes  . Smokeless tobacco: Never Used  Substance Use Topics  . Alcohol use: No  . Drug use: No    Review of Systems Constitutional: No fever/chills Eyes: No visual changes. ENT: Resolving sore throat.  Bilateral ear pain. Cardiovascular: Denies chest pain. Respiratory: Denies shortness of breath.  Positive productive cough. Musculoskeletal: Negative for back pain. Skin: Negative for rash. Neurological: Negative for headaches, focal weakness or numbness. ____________________________________________   PHYSICAL EXAM:  VITAL SIGNS: ED Triage Vitals  Enc Vitals Group     BP 04/26/18 0832 135/88     Pulse Rate 04/26/18 0832 100     Resp 04/26/18 0832 20     Temp 04/26/18 0832 98.3 F (36.8 C)     Temp Source 04/26/18 0832 Oral     SpO2 04/26/18 0832 96 %     Weight 04/26/18 0834 206 lb (93.4 kg)     Height 04/26/18 0834 5' (1.524 m)     Head Circumference --      Peak Flow --  Pain Score 04/26/18 0833 4     Pain Loc --      Pain Edu? --      Excl. in GC? --    Constitutional: Alert and oriented. Well appearing and in no acute distress. Eyes: Conjunctivae are normal.  Head: Atraumatic. Nose: Mild congestion/rhinnorhea.  EACs are clear.  TMs are dull but no erythema or injection is noted. Mouth/Throat: Mucous membranes are moist.  Oropharynx non-erythematous.  Moderate posterior drainage seen. Neck: No stridor.   Hematological/Lymphatic/Immunilogical: No cervical lymphadenopathy. Cardiovascular: Normal rate, regular rhythm. Grossly normal heart sounds.  Good peripheral circulation. Respiratory: Normal respiratory effort.   No retractions. Lungs CTAB. Musculoskeletal: Moves upper and lower extremities that any difficulty.  Normal gait was noted. Neurologic:  Normal speech and language. No gross focal neurologic deficits are appreciated. No gait instability. Skin:  Skin is warm, dry and intact. No rash noted. Psychiatric: Mood and affect are normal. Speech and behavior are normal.  ____________________________________________   LABS (all labs ordered are listed, but only abnormal results are displayed)  Labs Reviewed - No data to display ____________________________________________  PROCEDURES  Procedure(s) performed: None  Procedures  Critical Care performed: No  ____________________________________________   INITIAL IMPRESSION / ASSESSMENT AND PLAN / ED COURSE  As part of my medical decision making, I reviewed the following data within the electronic MEDICAL RECORD NUMBER Notes from prior ED visits and  Controlled Substance Database  Patient presents to the ED with complaint of cough and congestion for approximately 2 weeks.  She states that she was seen by her PCP and given a Z-Pak which did not help and she continues to have a cough that has become productive today.  She is unaware of any fever and denies chills.  She continues to smoke.  She does not take any other medication.  Exam is consistent with a viral URI.  Patient was given a prescription for Bromfed-DM as needed for cough and congestion.  She is encouraged to increase fluids.  Tylenol or ibuprofen as needed.  She is to follow-up with her PCP if any continued problems.  ____________________________________________   FINAL CLINICAL IMPRESSION(S) / ED DIAGNOSES  Final diagnoses:  Viral URI with cough     ED Discharge Orders         Ordered    brompheniramine-pseudoephedrine-DM 30-2-10 MG/5ML syrup  4 times daily PRN     04/26/18 0944           Note:  This document was prepared using Dragon voice recognition software and may  include unintentional dictation errors.    Tommi Rumps, PA-C 04/26/18 1610    Minna Antis, MD 04/26/18 [id number]

## 2018-04-27 ENCOUNTER — Encounter: Payer: Self-pay | Admitting: Obstetrics and Gynecology

## 2018-04-27 ENCOUNTER — Ambulatory Visit (INDEPENDENT_AMBULATORY_CARE_PROVIDER_SITE_OTHER): Payer: Medicaid Other | Admitting: Obstetrics and Gynecology

## 2018-04-27 ENCOUNTER — Other Ambulatory Visit: Payer: Self-pay

## 2018-04-27 ENCOUNTER — Encounter
Admission: RE | Admit: 2018-04-27 | Discharge: 2018-04-27 | Disposition: A | Discharge status: Home / Self Care | Payer: Medicaid Other | Source: Ambulatory Visit | Attending: Obstetrics and Gynecology | Admitting: Obstetrics and Gynecology

## 2018-04-27 VITALS — BP 127/80 | HR 118 | Ht 60.0 in | Wt 206.6 lb

## 2018-04-27 DIAGNOSIS — Z01818 Encounter for other preprocedural examination: Secondary | ICD-10-CM | POA: Insufficient documentation

## 2018-04-27 DIAGNOSIS — N871 Moderate cervical dysplasia: Secondary | ICD-10-CM

## 2018-04-27 HISTORY — DX: Hypothyroidism, unspecified: E03.9

## 2018-04-27 LAB — CBC WITH DIFFERENTIAL/PLATELET
ABS IMMATURE GRANULOCYTES: 0.11 10*3/uL — AB (ref 0.00–0.07)
BASOS ABS: 0.1 10*3/uL (ref 0.0–0.1)
BASOS PCT: 1 %
EOS ABS: 0.2 10*3/uL (ref 0.0–0.5)
Eosinophils Relative: 2 %
HCT: 42.5 % (ref 36.0–46.0)
Hemoglobin: 15.1 g/dL — ABNORMAL HIGH (ref 12.0–15.0)
Immature Granulocytes: 1 %
LYMPHS ABS: 2.8 10*3/uL (ref 0.7–4.0)
Lymphocytes Relative: 24 %
MCH: 33 pg (ref 26.0–34.0)
MCHC: 35.5 g/dL (ref 30.0–36.0)
MCV: 93 fL (ref 80.0–100.0)
Monocytes Absolute: 0.9 10*3/uL (ref 0.1–1.0)
Monocytes Relative: 8 %
NEUTROS ABS: 7.3 10*3/uL (ref 1.7–7.7)
NEUTROS PCT: 64 %
NRBC: 0 % (ref 0.0–0.2)
PLATELETS: 271 10*3/uL (ref 150–400)
RBC: 4.57 MIL/uL (ref 3.87–5.11)
RDW: 11.4 % — AB (ref 11.5–15.5)
WBC: 11.4 10*3/uL — ABNORMAL HIGH (ref 4.0–10.5)

## 2018-04-27 LAB — TYPE AND SCREEN
ABO/RH(D): A POS
ANTIBODY SCREEN: NEGATIVE

## 2018-04-27 NOTE — Patient Instructions (Signed)
Your procedure is scheduled on: Mon 10/21 Report to Day Surgery. To find out your arrival time please call [phone] between 1PM - 3PM on Fri. 10/18.  Remember: Instructions that are not followed completely may result in serious medical risk,  up to and including death, or upon the discretion of your surgeon and anesthesiologist your  surgery may need to be rescheduled.     _X__ 1. Do not eat food after midnight the night before your procedure.                 No gum chewing or hard candies. You may drink clear liquids up to 2 hours                 before you are scheduled to arrive for your surgery- DO not drink clear                 liquids within 2 hours of the start of your surgery.                 Clear Liquids include:  water, apple juice without pulp, clear carbohydrate                 drink such as Clearfast of Gatorade, Black Coffee or Tea (Do not add                 anything to coffee or tea).  __X__2.  On the morning of surgery brush your teeth with toothpaste and water, you                may rinse your mouth with mouthwash if you wish.  Do not swallow any toothpaste of mouthwash.     ___ 3.  No Alcohol for 24 hours before or after surgery.   _X__ 4.  Do Not Smoke or use e-cigarettes For 24 Hours Prior to Your Surgery.                 Do not use any chewable tobacco products for at least 6 hours prior to                 surgery.  ____  5.  Bring all medications with you on the day of surgery if instructed.   __x__  6.  Notify your doctor if there is any change in your medical condition      (cold, fever, infections).     Do not wear jewelry, make-up, hairpins, clips or nail polish. Do not wear lotions, powders, or perfumes. You may wear deodorant. Do not shave 48 hours prior to surgery. Men may shave face and neck. Do not bring valuables to the hospital.    [facility] is not responsible for any belongings or valuables.  Contacts, dentures  or bridgework may not be worn into surgery. Leave your suitcase in the car. After surgery it may be brought to your room. For patients admitted to the hospital, discharge time is determined by your treatment team.   Patients discharged the day of surgery will not be allowed to drive home.   Please read over the following fact sheets that you were given:    __x__ Take these medicines the morning of surgery with A SIP OF WATER:    1. brompheniramine-pseudoephedrine-DM 30-2-10 MG/5ML syrup  2.   3.   4.  5.  6.  ____ Fleet Enema (as directed)   ____ Use CHG Soap as directed  ____ Use inhalers on  the day of surgery  ____ Stop metformin 2 days prior to surgery    ____ Take 1/2 of usual insulin dose the night before surgery. No insulin the morning          of surgery.   ____ Stop Coumadin/Plavix/aspirin on   __x__ Stop Anti-inflammatories ibuprofen or aleve today  May take tylenol   _x___ Stop supplements until after surgery.  MELATONIN-CHAMOMILE PO  May take tylenol PM  ____ Bring C-Pap to the hospital.    Xclear (xylitol nasal spray)

## 2018-04-27 NOTE — Patient Instructions (Signed)
1.  Return in 4 weeks for postop check 

## 2018-04-28 LAB — RPR: RPR: NONREACTIVE

## 2018-04-28 NOTE — Progress Notes (Addendum)
PREOPERATIVE HISTORY AND PHYSICAL  Date of surgery: 05/03/2018 Diagnosis: 1.  CIN-2 of cervix 2.  Inadequate colposcopy with inability to see full extent of lesion into the endocervical canal Procedure: LEEP cone biopsy   Patient is a 22 y.o. G2P1031female scheduled for surgery on 05/03/2018 for management of high-grade dysplasia.  Abnormal Pap smear history: 02/10/2018 Pap/HPV LGSIL/-16, -18 03/03/2018 colposcopy with biopsies-inadequate colposcopy acetowhite epithelium at 11:00 and 12:00; SCJ not visualized  ECC-negative  Cervix 11:00-CIN-2  Cervix 5:00-CIN-1 Colposcopy was difficult due to narrow vagina, high position of cervix within the vaginal vault, and redundant vaginal sidewalls  Pertinent Gynecological History: Contraception-ParaGard; patient desires to maintain ParaGard if possible Tobacco user STD history: Negative   OB History    Gravida  2   Para  2   Term  1   Preterm      AB      Living  2     SAB      TAB      Ectopic      Multiple  0   Live Births  2          Patient's last menstrual period was 04/03/2018 (exact date).    Past Medical History:  Diagnosis Date  . Abnormal Pap smear of cervix   . Chronic kidney disease    CHRONIC  RIGHT PYELONEPHRITIS  . COPD (chronic obstructive pulmonary disease) (HCC)   . Family history of adverse reaction to anesthesia    PTS MOM WENT INTO COMA AFTER HYSTERECTOMY  . Heart murmur   . Hypothyroidism   . Thyroid goiter     Past Surgical History:  Procedure Laterality Date  . REMOVAL OF DRUG DELIVERY IMPLANT Left 12/15/2014   Procedure: REMOVAL OF DRUG DELIVERY IMPLANT;  Surgeon: Nadara Mustard, MD;  Location: [facility] ORS;  Service: Gynecology;  Laterality: Left;    OB History  Gravida Para Term Preterm AB Living  2 2 1     2   SAB TAB Ectopic Multiple Live Births        0 2    # Outcome Date GA Lbr Len/2nd Weight Sex Delivery Anes PTL Lv  2 Term 11/30/15 [redacted]w[redacted]d / 00:08 4 lb 15 oz (2.24 kg) F  Vag-Spont EPI  LIV  1 Para     F Vag-Spont  N LIV    Social History   Socioeconomic History  . Marital status: Single    Spouse name: Not on file  . Number of children: Not on file  . Years of education: Not on file  . Highest education level: Not on file  Occupational History  . Not on file  Social Needs  . Financial resource strain: Not on file  . Food insecurity:    Worry: Not on file    Inability: Not on file  . Transportation needs:    Medical: Not on file    Non-medical: Not on file  Tobacco Use  . Smoking status: Current Every Day Smoker    Packs/day: 1.00    Years: 5.00    Pack years: 5.00    Types: Cigarettes  . Smokeless tobacco: Never Used  Substance and Sexual Activity  . Alcohol use: No  . Drug use: No  . Sexual activity: Yes    Birth control/protection: IUD  Lifestyle  . Physical activity:    Days per week: Not on file    Minutes per session: Not on file  . Stress: Not on file  Relationships  .  Social connections:    Talks on phone: Not on file    Gets together: Not on file    Attends religious service: Not on file    Active member of club or organization: Not on file    Attends meetings of clubs or organizations: Not on file    Relationship status: Not on file  Other Topics Concern  . Not on file  Social History Narrative  . Not on file    Family History  Problem Relation Age of Onset  . Breast cancer Neg Hx   . Ovarian cancer Neg Hx   . Colon cancer Neg Hx      (Not in a hospital admission)  Allergies  Allergen Reactions  . Kiwi Extract Anaphylaxis  . Amoxicillin Hives, Rash and Other (See Comments)    Blisters Has patient had a PCN reaction causing immediate rash, facial/tongue/throat swelling, SOB or lightheadedness with hypotension: No Has patient had a PCN reaction causing severe rash involving mucus membranes or skin necrosis: Yes Has patient had a PCN reaction that required hospitalization: No Has patient had a PCN reaction  occurring within the last 10 years: Unknown If all of the above answers are "NO", then may proceed with Cephalosporin use.   Marland Kitchen Penicillins Rash and Other (See Comments)    Has patient had a PCN reaction causing immediate rash, facial/tongue/throat swelling, SOB or lightheadedness with hypotension:no Has patient had a PCN reaction causing severe rash involving mucus membranes or skin necrosis: yes Has patient had a PCN reaction that required hospitalization no Has patient had a PCN reaction occurring within the last 10 years: yes If all of the above answers are "NO", then may proceed with Cephalosporin use.   . Tramadol Itching  . Cranberry Rash  . Sulfa Antibiotics Rash and Other (See Comments)    BLISTERS    Review of Systems Constitutional: No recent fever/chills/sweats Respiratory: No recent cough/bronchitis Cardiovascular: No chest pain Gastrointestinal: No recent nausea/vomiting/diarrhea Genitourinary: No UTI symptoms Hematologic/lymphatic:No history of coagulopathy or recent blood thinner use    Objective:    BP 127/80   Pulse (!) 118   Ht 5' (1.524 m)   Wt 206 lb 9.6 oz (93.7 kg)   LMP 04/03/2018 (Exact Date)   BMI 40.35 kg/m   General:   Normal  Skin:   normal  HEENT:  Normal  Neck:  Supple without Adenopathy or Thyromegaly  Lungs:   Heart:              Breasts:   Abdomen:  Pelvis:  M/S   Extremeties:  Neuro:    clear to auscultation bilaterally   Normal without murmur   Not Examined   soft, non-tender; bowel sounds normal; no masses,  no organomegaly   Exam deferred to OR  No CVAT  Warm/Dry   Normal          Assessment:    1.  CIN-2 of the cervix 2.  Inadequate colposcopy  Plan:  LEEP cone biopsy    Preop counseling: The patient is to undergo LEEP cone biopsy of the cervix for management of CIN-2.  She is understanding of the planned procedure and is aware of and is accepting of all surgical risks which include but are not limited to  bleeding, infection, pelvic organ injury with need for repair, blood clot disorders, anesthesia risk, etc.  All questions have been answered and informed sent was given.  Patient is ready willing to proceed with surgery as scheduled.  Addendum: The patient has ParaGard IUD; we will make all efforts to help retain IUD in place during the procedure; patient does understand that the IUD may need to be removed in order to adequately accomplish the LEEP cone biopsy.  Herold Harms, MD  Note: This dictation was prepared with Dragon dictation along with smaller phrase technology. Any transcriptional errors that result from this process are unintentional.

## 2018-04-29 NOTE — H&P (Signed)
PREOPERATIVE HISTORY AND PHYSICAL  Date of surgery: 05/03/2018 Diagnosis: 1.  CIN-2 of cervix 2.  Inadequate colposcopy with inability to see full extent of lesion into the endocervical canal Procedure: LEEP cone biopsy   Patient is a 22 y.o. G2P1002female scheduled for surgery on 05/03/2018 for management of high-grade dysplasia.  Abnormal Pap smear history: 02/10/2018 Pap/HPV LGSIL/-16, -18 03/03/2018 colposcopy with biopsies-inadequate colposcopy acetowhite epithelium at 11:00 and 12:00; SCJ not visualized  ECC-negative  Cervix 11:00-CIN-2  Cervix 5:00-CIN-1 Colposcopy was difficult due to narrow vagina, high position of cervix within the vaginal vault, and redundant vaginal sidewalls  Pertinent Gynecological History: Contraception-ParaGard; patient desires to maintain ParaGard if possible Tobacco user STD history: Negative   OB History    Gravida  2   Para  2   Term  1   Preterm      AB      Living  2     SAB      TAB      Ectopic      Multiple  0   Live Births  2          Patient's last menstrual period was 04/03/2018 (exact date).    Past Medical History:  Diagnosis Date  . Abnormal Pap smear of cervix   . Chronic kidney disease    CHRONIC  RIGHT PYELONEPHRITIS  . COPD (chronic obstructive pulmonary disease) (HCC)   . Family history of adverse reaction to anesthesia    PTS MOM WENT INTO COMA AFTER HYSTERECTOMY  . Heart murmur   . Hypothyroidism   . Thyroid goiter     Past Surgical History:  Procedure Laterality Date  . REMOVAL OF DRUG DELIVERY IMPLANT Left 12/15/2014   Procedure: REMOVAL OF DRUG DELIVERY IMPLANT;  Surgeon: Robert P Harris, MD;  Location: [facility] ORS;  Service: Gynecology;  Laterality: Left;    OB History  Gravida Para Term Preterm AB Living  2 2 1     2  SAB TAB Ectopic Multiple Live Births        0 2    # Outcome Date GA Lbr Len/2nd Weight Sex Delivery Anes PTL Lv  2 Term 11/30/15 [redacted]w[redacted]d / 00:08 4 lb 15 oz (2.24 kg) F  Vag-Spont EPI  LIV  1 Para     F Vag-Spont  N LIV    Social History   Socioeconomic History  . Marital status: Single    Spouse name: Not on file  . Number of children: Not on file  . Years of education: Not on file  . Highest education level: Not on file  Occupational History  . Not on file  Social Needs  . Financial resource strain: Not on file  . Food insecurity:    Worry: Not on file    Inability: Not on file  . Transportation needs:    Medical: Not on file    Non-medical: Not on file  Tobacco Use  . Smoking status: Current Every Day Smoker    Packs/day: 1.00    Years: 5.00    Pack years: 5.00    Types: Cigarettes  . Smokeless tobacco: Never Used  Substance and Sexual Activity  . Alcohol use: No  . Drug use: No  . Sexual activity: Yes    Birth control/protection: IUD  Lifestyle  . Physical activity:    Days per week: Not on file    Minutes per session: Not on file  . Stress: Not on file  Relationships  .   Social connections:    Talks on phone: Not on file    Gets together: Not on file    Attends religious service: Not on file    Active member of club or organization: Not on file    Attends meetings of clubs or organizations: Not on file    Relationship status: Not on file  Other Topics Concern  . Not on file  Social History Narrative  . Not on file    Family History  Problem Relation Age of Onset  . Breast cancer Neg Hx   . Ovarian cancer Neg Hx   . Colon cancer Neg Hx      (Not in a hospital admission)  Allergies  Allergen Reactions  . Kiwi Extract Anaphylaxis  . Amoxicillin Hives, Rash and Other (See Comments)    Blisters Has patient had a PCN reaction causing immediate rash, facial/tongue/throat swelling, SOB or lightheadedness with hypotension: No Has patient had a PCN reaction causing severe rash involving mucus membranes or skin necrosis: Yes Has patient had a PCN reaction that required hospitalization: No Has patient had a PCN reaction  occurring within the last 10 years: Unknown If all of the above answers are "NO", then may proceed with Cephalosporin use.   . Penicillins Rash and Other (See Comments)    Has patient had a PCN reaction causing immediate rash, facial/tongue/throat swelling, SOB or lightheadedness with hypotension:no Has patient had a PCN reaction causing severe rash involving mucus membranes or skin necrosis: yes Has patient had a PCN reaction that required hospitalization no Has patient had a PCN reaction occurring within the last 10 years: yes If all of the above answers are "NO", then may proceed with Cephalosporin use.   . Tramadol Itching  . Cranberry Rash  . Sulfa Antibiotics Rash and Other (See Comments)    BLISTERS    Review of Systems Constitutional: No recent fever/chills/sweats Respiratory: No recent cough/bronchitis Cardiovascular: No chest pain Gastrointestinal: No recent nausea/vomiting/diarrhea Genitourinary: No UTI symptoms Hematologic/lymphatic:No history of coagulopathy or recent blood thinner use    Objective:    BP 127/80   Pulse (!) 118   Ht 5' (1.524 m)   Wt 206 lb 9.6 oz (93.7 kg)   LMP 04/03/2018 (Exact Date)   BMI 40.35 kg/m   General:   Normal  Skin:   normal  HEENT:  Normal  Neck:  Supple without Adenopathy or Thyromegaly  Lungs:   Heart:              Breasts:   Abdomen:  Pelvis:  M/S   Extremeties:  Neuro:    clear to auscultation bilaterally   Normal without murmur   Not Examined   soft, non-tender; bowel sounds normal; no masses,  no organomegaly   Exam deferred to OR  No CVAT  Warm/Dry   Normal          Assessment:    1.  CIN-2 of the cervix 2.  Inadequate colposcopy  Plan:  LEEP cone biopsy    Preop counseling: The patient is to undergo LEEP cone biopsy of the cervix for management of CIN-2.  She is understanding of the planned procedure and is aware of and is accepting of all surgical risks which include but are not limited to  bleeding, infection, pelvic organ injury with need for repair, blood clot disorders, anesthesia risk, etc.  All questions have been answered and informed sent was given.  Patient is ready willing to proceed with surgery as scheduled.   Addendum: The patient has ParaGard IUD; we will make all efforts to help retain IUD in place during the procedure; patient does understand that the IUD may need to be removed in order to adequately accomplish the LEEP cone biopsy.  Jamiee Milholland A Vickye Astorino, MD  Note: This dictation was prepared with Dragon dictation along with smaller phrase technology. Any transcriptional errors that result from this process are unintentional.   

## 2018-04-29 NOTE — H&P (View-Only) (Signed)
PREOPERATIVE HISTORY AND PHYSICAL  Date of surgery: 05/03/2018 Diagnosis: 1.  CIN-2 of cervix 2.  Inadequate colposcopy with inability to see full extent of lesion into the endocervical canal Procedure: LEEP cone biopsy   Patient is a 22 y.o. G2P1002female scheduled for surgery on 05/03/2018 for management of high-grade dysplasia.  Abnormal Pap smear history: 02/10/2018 Pap/HPV LGSIL/-16, -18 03/03/2018 colposcopy with biopsies-inadequate colposcopy acetowhite epithelium at 11:00 and 12:00; SCJ not visualized  ECC-negative  Cervix 11:00-CIN-2  Cervix 5:00-CIN-1 Colposcopy was difficult due to narrow vagina, high position of cervix within the vaginal vault, and redundant vaginal sidewalls  Pertinent Gynecological History: Contraception-ParaGard; patient desires to maintain ParaGard if possible Tobacco user STD history: Negative   OB History    Gravida  2   Para  2   Term  1   Preterm      AB      Living  2     SAB      TAB      Ectopic      Multiple  0   Live Births  2          Patient's last menstrual period was 04/03/2018 (exact date).    Past Medical History:  Diagnosis Date  . Abnormal Pap smear of cervix   . Chronic kidney disease    CHRONIC  RIGHT PYELONEPHRITIS  . COPD (chronic obstructive pulmonary disease) (HCC)   . Family history of adverse reaction to anesthesia    PTS MOM WENT INTO COMA AFTER HYSTERECTOMY  . Heart murmur   . Hypothyroidism   . Thyroid goiter     Past Surgical History:  Procedure Laterality Date  . REMOVAL OF DRUG DELIVERY IMPLANT Left 12/15/2014   Procedure: REMOVAL OF DRUG DELIVERY IMPLANT;  Surgeon: Robert P Harris, MD;  Location: [facility] ORS;  Service: Gynecology;  Laterality: Left;    OB History  Gravida Para Term Preterm AB Living  2 2 1     2  SAB TAB Ectopic Multiple Live Births        0 2    # Outcome Date GA Lbr Len/2nd Weight Sex Delivery Anes PTL Lv  2 Term 11/30/15 [redacted]w[redacted]d / 00:08 4 lb 15 oz (2.24 kg) F  Vag-Spont EPI  LIV  1 Para     F Vag-Spont  N LIV    Social History   Socioeconomic History  . Marital status: Single    Spouse name: Not on file  . Number of children: Not on file  . Years of education: Not on file  . Highest education level: Not on file  Occupational History  . Not on file  Social Needs  . Financial resource strain: Not on file  . Food insecurity:    Worry: Not on file    Inability: Not on file  . Transportation needs:    Medical: Not on file    Non-medical: Not on file  Tobacco Use  . Smoking status: Current Every Day Smoker    Packs/day: 1.00    Years: 5.00    Pack years: 5.00    Types: Cigarettes  . Smokeless tobacco: Never Used  Substance and Sexual Activity  . Alcohol use: No  . Drug use: No  . Sexual activity: Yes    Birth control/protection: IUD  Lifestyle  . Physical activity:    Days per week: Not on file    Minutes per session: Not on file  . Stress: Not on file  Relationships  .   Social connections:    Talks on phone: Not on file    Gets together: Not on file    Attends religious service: Not on file    Active member of club or organization: Not on file    Attends meetings of clubs or organizations: Not on file    Relationship status: Not on file  Other Topics Concern  . Not on file  Social History Narrative  . Not on file    Family History  Problem Relation Age of Onset  . Breast cancer Neg Hx   . Ovarian cancer Neg Hx   . Colon cancer Neg Hx      (Not in a hospital admission)  Allergies  Allergen Reactions  . Kiwi Extract Anaphylaxis  . Amoxicillin Hives, Rash and Other (See Comments)    Blisters Has patient had a PCN reaction causing immediate rash, facial/tongue/throat swelling, SOB or lightheadedness with hypotension: No Has patient had a PCN reaction causing severe rash involving mucus membranes or skin necrosis: Yes Has patient had a PCN reaction that required hospitalization: No Has patient had a PCN reaction  occurring within the last 10 years: Unknown If all of the above answers are "NO", then may proceed with Cephalosporin use.   . Penicillins Rash and Other (See Comments)    Has patient had a PCN reaction causing immediate rash, facial/tongue/throat swelling, SOB or lightheadedness with hypotension:no Has patient had a PCN reaction causing severe rash involving mucus membranes or skin necrosis: yes Has patient had a PCN reaction that required hospitalization no Has patient had a PCN reaction occurring within the last 10 years: yes If all of the above answers are "NO", then may proceed with Cephalosporin use.   . Tramadol Itching  . Cranberry Rash  . Sulfa Antibiotics Rash and Other (See Comments)    BLISTERS    Review of Systems Constitutional: No recent fever/chills/sweats Respiratory: No recent cough/bronchitis Cardiovascular: No chest pain Gastrointestinal: No recent nausea/vomiting/diarrhea Genitourinary: No UTI symptoms Hematologic/lymphatic:No history of coagulopathy or recent blood thinner use    Objective:    BP 127/80   Pulse (!) 118   Ht 5' (1.524 m)   Wt 206 lb 9.6 oz (93.7 kg)   LMP 04/03/2018 (Exact Date)   BMI 40.35 kg/m   General:   Normal  Skin:   normal  HEENT:  Normal  Neck:  Supple without Adenopathy or Thyromegaly  Lungs:   Heart:              Breasts:   Abdomen:  Pelvis:  M/S   Extremeties:  Neuro:    clear to auscultation bilaterally   Normal without murmur   Not Examined   soft, non-tender; bowel sounds normal; no masses,  no organomegaly   Exam deferred to OR  No CVAT  Warm/Dry   Normal          Assessment:    1.  CIN-2 of the cervix 2.  Inadequate colposcopy  Plan:  LEEP cone biopsy    Preop counseling: The patient is to undergo LEEP cone biopsy of the cervix for management of CIN-2.  She is understanding of the planned procedure and is aware of and is accepting of all surgical risks which include but are not limited to  bleeding, infection, pelvic organ injury with need for repair, blood clot disorders, anesthesia risk, etc.  All questions have been answered and informed sent was given.  Patient is ready willing to proceed with surgery as scheduled.   Addendum: The patient has ParaGard IUD; we will make all efforts to help retain IUD in place during the procedure; patient does understand that the IUD may need to be removed in order to adequately accomplish the LEEP cone biopsy.  Bryson Palen A Lydia Toren, MD  Note: This dictation was prepared with Dragon dictation along with smaller phrase technology. Any transcriptional errors that result from this process are unintentional.   

## 2018-04-30 LAB — HIV-1/2 AB - DIFFERENTIATION
HIV 1 AB: NEGATIVE
HIV 2 AB: NEGATIVE
NOTE (HIV CONF MULTISPOT): NEGATIVE

## 2018-04-30 LAB — RNA QUALITATIVE

## 2018-05-03 ENCOUNTER — Ambulatory Visit: Payer: Self-pay | Admitting: Anesthesiology

## 2018-05-03 ENCOUNTER — Encounter: Payer: Self-pay | Admitting: *Deleted

## 2018-05-03 ENCOUNTER — Encounter
Admission: RE | Disposition: A | Discharge status: Home / Self Care | Payer: Self-pay | Source: Ambulatory Visit | Attending: Obstetrics and Gynecology

## 2018-05-03 ENCOUNTER — Ambulatory Visit
Admission: RE | Admit: 2018-05-03 | Discharge: 2018-05-03 | Disposition: A | Discharge status: Home / Self Care | Payer: Self-pay | Source: Ambulatory Visit | Attending: Obstetrics and Gynecology | Admitting: Obstetrics and Gynecology

## 2018-05-03 DIAGNOSIS — J449 Chronic obstructive pulmonary disease, unspecified: Secondary | ICD-10-CM | POA: Insufficient documentation

## 2018-05-03 DIAGNOSIS — F17210 Nicotine dependence, cigarettes, uncomplicated: Secondary | ICD-10-CM | POA: Insufficient documentation

## 2018-05-03 DIAGNOSIS — N871 Moderate cervical dysplasia: Secondary | ICD-10-CM

## 2018-05-03 DIAGNOSIS — Z9889 Other specified postprocedural states: Secondary | ICD-10-CM

## 2018-05-03 DIAGNOSIS — N189 Chronic kidney disease, unspecified: Secondary | ICD-10-CM | POA: Insufficient documentation

## 2018-05-03 HISTORY — PX: LEEP: SHX91

## 2018-05-03 LAB — POCT PREGNANCY, URINE: Preg Test, Ur: NEGATIVE

## 2018-05-03 SURGERY — LEEP (LOOP ELECTROSURGICAL EXCISION PROCEDURE)
Anesthesia: General

## 2018-05-03 MED ORDER — PROPOFOL 10 MG/ML IV BOLUS
INTRAVENOUS | Status: AC
  Filled 2018-05-03: qty 20

## 2018-05-03 MED ORDER — IBUPROFEN 600 MG PO TABS: 600.0000 mg | ORAL_TABLET | Freq: Four times a day (QID) | ORAL | 0 refills | Status: DC

## 2018-05-03 MED ORDER — KETOROLAC TROMETHAMINE 30 MG/ML IJ SOLN: 30.0000 mg | Freq: Once | INTRAMUSCULAR | Status: DC | PRN

## 2018-05-03 MED ORDER — FENTANYL CITRATE (PF) 100 MCG/2ML IJ SOLN
INTRAMUSCULAR | Status: AC
  Administered 2018-05-03: 50 ug via INTRAVENOUS
  Filled 2018-05-03: qty 2

## 2018-05-03 MED ORDER — LIDOCAINE HCL (PF) 2 % IJ SOLN
INTRAMUSCULAR | Status: AC
  Filled 2018-05-03: qty 10

## 2018-05-03 MED ORDER — ACETAMINOPHEN 500 MG PO TABS: 1000.0000 mg | ORAL_TABLET | Freq: Four times a day (QID) | ORAL | 0 refills | Status: AC

## 2018-05-03 MED ORDER — FERRIC SUBSULFATE 259 MG/GM EX SOLN
CUTANEOUS | Status: DC | PRN
  Administered 2018-05-03: 1 via TOPICAL

## 2018-05-03 MED ORDER — FENTANYL CITRATE (PF) 100 MCG/2ML IJ SOLN
INTRAMUSCULAR | Status: AC
  Filled 2018-05-03: qty 2

## 2018-05-03 MED ORDER — LACTATED RINGERS IV SOLN
INTRAVENOUS | Status: DC
  Administered 2018-05-03: 12:00:00 via INTRAVENOUS

## 2018-05-03 MED ORDER — KETOROLAC TROMETHAMINE 30 MG/ML IJ SOLN
INTRAMUSCULAR | Status: AC
  Filled 2018-05-03: qty 1

## 2018-05-03 MED ORDER — ONDANSETRON HCL 4 MG/2ML IJ SOLN
INTRAMUSCULAR | Status: DC | PRN
  Administered 2018-05-03: 4 mg via INTRAVENOUS

## 2018-05-03 MED ORDER — KETOROLAC TROMETHAMINE 30 MG/ML IJ SOLN
INTRAMUSCULAR | Status: DC | PRN
  Administered 2018-05-03: 30 mg via INTRAVENOUS

## 2018-05-03 MED ORDER — PROMETHAZINE HCL 25 MG/ML IJ SOLN: 6.2500 mg | INTRAMUSCULAR | Status: DC | PRN

## 2018-05-03 MED ORDER — ACETAMINOPHEN 325 MG PO TABS: 325.0000 mg | ORAL_TABLET | ORAL | Status: DC | PRN

## 2018-05-03 MED ORDER — PROPOFOL 10 MG/ML IV BOLUS
INTRAVENOUS | Status: DC | PRN
  Administered 2018-05-03: 200 mg via INTRAVENOUS

## 2018-05-03 MED ORDER — FERRIC SUBSULFATE 259 MG/GM EX SOLN
CUTANEOUS | Status: AC
  Filled 2018-05-03: qty 8

## 2018-05-03 MED ORDER — MEPERIDINE HCL 50 MG/ML IJ SOLN: 6.2500 mg | INTRAMUSCULAR | Status: DC | PRN

## 2018-05-03 MED ORDER — LIDOCAINE-EPINEPHRINE 1 %-1:100000 IJ SOLN
INTRAMUSCULAR | Status: AC
  Filled 2018-05-03: qty 1

## 2018-05-03 MED ORDER — FAMOTIDINE 20 MG PO TABS
ORAL_TABLET | ORAL | Status: AC
  Administered 2018-05-03: 20 mg via ORAL
  Filled 2018-05-03: qty 1

## 2018-05-03 MED ORDER — LIDOCAINE-EPINEPHRINE 1 %-1:100000 IJ SOLN
INTRAMUSCULAR | Status: DC | PRN
  Administered 2018-05-03: 14 mL

## 2018-05-03 MED ORDER — IODINE STRONG (LUGOLS) 5 % PO SOLN
ORAL | Status: DC | PRN
  Administered 2018-05-03: 5 mL

## 2018-05-03 MED ORDER — FENTANYL CITRATE (PF) 100 MCG/2ML IJ SOLN
INTRAMUSCULAR | Status: DC | PRN
  Administered 2018-05-03 (×4): 25 ug via INTRAVENOUS

## 2018-05-03 MED ORDER — DEXAMETHASONE SODIUM PHOSPHATE 10 MG/ML IJ SOLN
INTRAMUSCULAR | Status: AC
  Filled 2018-05-03: qty 1

## 2018-05-03 MED ORDER — MIDAZOLAM HCL 2 MG/2ML IJ SOLN
INTRAMUSCULAR | Status: AC
  Filled 2018-05-03: qty 2

## 2018-05-03 MED ORDER — IODINE STRONG (LUGOLS) 5 % PO SOLN
ORAL | Status: AC
  Filled 2018-05-03: qty 2

## 2018-05-03 MED ORDER — FENTANYL CITRATE (PF) 100 MCG/2ML IJ SOLN
25.0000 ug | INTRAMUSCULAR | Status: DC | PRN
  Administered 2018-05-03 (×2): 50 ug via INTRAVENOUS

## 2018-05-03 MED ORDER — DEXAMETHASONE SODIUM PHOSPHATE 10 MG/ML IJ SOLN
INTRAMUSCULAR | Status: DC | PRN
  Administered 2018-05-03: 10 mg via INTRAVENOUS

## 2018-05-03 MED ORDER — LIDOCAINE HCL (CARDIAC) PF 100 MG/5ML IV SOSY
PREFILLED_SYRINGE | INTRAVENOUS | Status: DC | PRN
  Administered 2018-05-03: 100 mg via INTRAVENOUS

## 2018-05-03 MED ORDER — MIDAZOLAM HCL 2 MG/2ML IJ SOLN
INTRAMUSCULAR | Status: DC | PRN
  Administered 2018-05-03: 2 mg via INTRAVENOUS

## 2018-05-03 MED ORDER — FAMOTIDINE 20 MG PO TABS
20.0000 mg | ORAL_TABLET | Freq: Once | ORAL | Status: AC
  Administered 2018-05-03: 20 mg via ORAL

## 2018-05-03 MED ORDER — ONDANSETRON HCL 4 MG/2ML IJ SOLN
INTRAMUSCULAR | Status: AC
  Filled 2018-05-03: qty 2

## 2018-05-03 MED ORDER — ACETAMINOPHEN 160 MG/5ML PO SOLN
325.0000 mg | ORAL | Status: DC | PRN
  Filled 2018-05-03: qty 20.3

## 2018-05-03 SURGICAL SUPPLY — 37 items
APPLICATOR COTTON TIP 6 STRL (MISCELLANEOUS) ×1 IMPLANT
APPLICATOR COTTON TIP 6IN STRL (MISCELLANEOUS) ×2
APPLICATOR SWAB PROCTO LG 16IN (MISCELLANEOUS) ×2 IMPLANT
CANISTER SUCT 1200ML W/VALVE (MISCELLANEOUS) ×2 IMPLANT
CATH ROBINSON RED A/P 16FR (CATHETERS) ×2 IMPLANT
COUNTER NEEDLE 20/40 LG (NEEDLE) ×2 IMPLANT
COVER WAND RF STERILE (DRAPES) ×2 IMPLANT
DEPRESSOR TONGUE BLADE STERILE (MISCELLANEOUS) ×2 IMPLANT
DRAPE UNDER BUTTOCK W/FLU (DRAPES) ×2 IMPLANT
DRSG TELFA 3X8 NADH (GAUZE/BANDAGES/DRESSINGS) ×2 IMPLANT
ELECT LEEP BALL 5MM 12CM (MISCELLANEOUS) ×2
ELECT LEEP LOOP 20X10 R2010 (MISCELLANEOUS) ×2
ELECT LOOP 1.0X1.0CM R1010 (MISCELLANEOUS) ×2
ELECT REM PT RETURN 9FT ADLT (ELECTROSURGICAL) ×2
ELECTRODE LEEP BALL 5MM 12CM (MISCELLANEOUS) ×1 IMPLANT
ELECTRODE LEP LOOP 20X10 R2010 (MISCELLANEOUS) ×1 IMPLANT
ELECTRODE LOOP 1.0X1.0CM R1010 (MISCELLANEOUS) ×1 IMPLANT
ELECTRODE REM PT RTRN 9FT ADLT (ELECTROSURGICAL) ×1 IMPLANT
GLOVE BIO SURGEON STRL SZ8 (GLOVE) ×2 IMPLANT
GOWN STRL REUS W/ TWL LRG LVL3 (GOWN DISPOSABLE) ×2 IMPLANT
GOWN STRL REUS W/ TWL XL LVL3 (GOWN DISPOSABLE) ×1 IMPLANT
GOWN STRL REUS W/TWL LRG LVL3 (GOWN DISPOSABLE) ×2
GOWN STRL REUS W/TWL XL LVL3 (GOWN DISPOSABLE) ×1
HANDLE YANKAUER SUCT BULB TIP (MISCELLANEOUS) ×2 IMPLANT
KIT TURNOVER CYSTO (KITS) ×2 IMPLANT
LABEL OR SOLS (LABEL) ×2 IMPLANT
NEEDLE SPNL 22GX3.5 QUINCKE BK (NEEDLE) ×2 IMPLANT
PACK DNC HYST (MISCELLANEOUS) ×2 IMPLANT
PAD OB MATERNITY 4.3X12.25 (PERSONAL CARE ITEMS) ×2 IMPLANT
PAD PREP 24X41 OB/GYN DISP (PERSONAL CARE ITEMS) ×2 IMPLANT
PENCIL ELECTRO HAND CTR (MISCELLANEOUS) ×2 IMPLANT
SOL PREP PVP 2OZ (MISCELLANEOUS) ×2
SOLUTION PREP PVP 2OZ (MISCELLANEOUS) ×1 IMPLANT
STRAW SMOKE EVAC LEEP 6150 NON (MISCELLANEOUS) ×2 IMPLANT
SUT SILK 2 0 SH (SUTURE) ×2 IMPLANT
SYR CONTROL 10ML (SYRINGE) ×2 IMPLANT
TOWEL OR 17X26 4PK STRL BLUE (TOWEL DISPOSABLE) ×2 IMPLANT

## 2018-05-03 NOTE — Anesthesia Post-op Follow-up Note (Signed)
Anesthesia QCDR form completed.        

## 2018-05-03 NOTE — Discharge Instructions (Signed)
Cervical Conization, Care After This sheet gives you information about how to care for yourself after your procedure. Your health care provider may also give you more specific instructions. If you have problems or questions, contact your health care provider. What can I expect after the procedure? After the procedure, it is common to have:  A groggy feeling, if you were given medicine to make you fall asleep (general anesthetic).  Cramps that feel similar to menstrual cramps.  Bloody discharge or light to moderate bleeding.  Dark discharge. This discharge may look similar to coffee grounds. This is from the paste that was applied to the cervix to control bleeding.  Follow these instructions at home: Medicines  Take over-the-counter and prescription medicines only as told by your health care provider.  Do not take aspirin until your health care provider says it is okay. Aspirin can cause bleeding.  If you are taking pain medicine: ? You may need to prevent or treat constipation. To do this, your health care provider may recommend that you:  Drink enough fluid to keep your urine clear or pale yellow.  Take over-the-counter or prescription medicines.  Eat foods that are high in fiber, such as fresh fruits and vegetables, whole grains, bran, and beans.  Limit foods that are high in fat and processed sugars, such as fried and sweet foods. ? Do not drive or use heavy machinery. General instructions  You may resume your normal diet unless your health care provider advises you not to do so.  Take showers for the first week. Do not take baths, swim, or use hot tubs until your health care provider says it is okay.  Do not douche, use tampons, or have sex until your health care provider says it is okay.  Avoid activities that require great effort, such as exercises and heavy lifting, for at least 7-14 days.  Keep all follow-up visits as told by your health care provider. This is  important. Contact a health care provider if:  You develop a rash.  You are dizzy or lightheaded.  You feel nauseous or you vomit.  You develop a bad smelling discharge from your vagina. Get help right away if:  You have blood clots or bleeding that is heavier than a normal period. Bleeding that soaks a pad in less than 1 hour is considered heavy bleeding.  You have a fever.  You have increasing cramps.  You faint.  You have pain when you urinate.  You have severe or worsening pain.  Your pain is not relieved when you take medicine.  You have bloody urine.  You vomit. Summary  After the procedure, it is common to have cramps and dark or bloody discharge from your vagina.  Do not douche, use tampons, or have sex until your health care provider says it is okay.  Follow all other activity restrictions as told by your health care provider. This information is not intended to replace advice given to you by your health care provider. Make sure you discuss any questions you have with your health care provider.   AMBULATORY SURGERY  DISCHARGE INSTRUCTIONS   1) The drugs that you were given will stay in your system until tomorrow so for the next 24 hours you should not:  A) Drive an automobile B) Make any legal decisions C) Drink any alcoholic beverage   2) You may resume regular meals tomorrow.  Today it is better to start with liquids and gradually work up to solid foods.  You may eat anything you prefer, but it is better to start with liquids, then soup and crackers, and gradually work up to solid foods.   3) Please notify your doctor immediately if you have any unusual bleeding, trouble breathing, redness and pain at the surgery site, drainage, fever, or pain not relieved by medication.    4) Additional Instructions:        Please contact your physician with any problems or Same Day Surgery at [phone], Monday through Friday 6 am to 4 pm, or [facility] number at [phone].

## 2018-05-03 NOTE — Op Note (Signed)
OPERATIVE NOTE:  Brooke Aguirre PROCEDURE DATE: 05/03/2018   PREOPERATIVE DIAGNOSIS: 1.  CIN-2 of the cervix 2.  Inadequate colposcopy with inability to see the full extent of the lesion  POSTOPERATIVE DIAGNOSIS: 1.  CIN-2 of the cervix 2.  Inadequate colposcopy with inability to see full extent of the lesion 3.  Normal staining Lugol's pattern  PROCEDURE: LEEP cone biopsy SURGEON:  Herold Harms, MD ASSISTANTS: None ANESTHESIA: General INDICATIONS: 22 y.o. G2P1002, using ParaGard IUD for contraception, presents for surgical management of CIN-2 of the cervix associated with an inadequate colposcopy.  FINDINGS: Normal standing Lugol's pattern   I/O's: Total I/O In: 400 [I.V.:400] Out: 60 [Urine:50; Blood:10] COUNTS:  YES SPECIMENS: 1.  Ectocervical LEEP 2.  Endocervical LEEP 3.  Post LEEP ECC  ANTIBIOTIC PROPHYLAXIS:N/A COMPLICATIONS: None immediate  PROCEDURE IN DETAIL: Patient was brought to the operating room placed in supine position.  General anesthesia was induced that difficulty.  She is placed in the dorsolithotomy position using the candycane stirrups.  A Hibiclens perineal prep and drape was performed in standard fashion. Timeout was completed. Red Robinson catheter was used to drain 50 mL of urine from the bladder. A coated speculum was placed into the vagina.  The vagina was painted with Lugol's solution with large Fox swabs, and this demonstrated a normal staining pattern.  The cervix was circumferentially infiltrated with 1% lidocaine with 1-100,000 epinephrine in order to optimize hemostasis.  The IUD strings were pushed back into the endocervical canal away from the operative field. The ectocervical LEEP was then performed with a wide loop.  The endocervical LEEP was then performed with a narrow loop.  Post LEEP ECC was performed with a serrated curette.  Rollerball cautery was used to provide hemostasis of the cone bed.  Estrogen was applied.   Procedure was then terminated with all instrumentation being removed from the vagina.  The patient was awakened mobilized and taken recovery room in satisfactory condition. EBL with 10 mL. Procedure was well-tolerated. No complications.  Brooke Aguirre A. Beatris Si, MD, A[facility]

## 2018-05-03 NOTE — Interval H&P Note (Signed)
History and Physical Interval Note:  05/03/2018 11:55 AM  Brooke Aguirre  has presented today for surgery, with the diagnosis of DYSPLASIA OF CERVIX, HIGH GRADE CIN 2  The various methods of treatment have been discussed with the patient and family. After consideration of risks, benefits and other options for treatment, the patient has consented to  Procedure(s): LOOP ELECTROSURGICAL EXCISION PROCEDURE (LEEP) (N/A) as a surgical intervention .  The patient's history has been reviewed, patient examined, no change in status, stable for surgery.  I have reviewed the patient's chart and labs.  Questions were answered to the patient's satisfaction.     Daphine Deutscher A Defrancesco

## 2018-05-03 NOTE — Anesthesia Preprocedure Evaluation (Signed)
Anesthesia Evaluation  Patient identified by MRN, date of birth, ID band Patient awake    Reviewed: Allergy & Precautions, H&P , NPO status , reviewed documented beta blocker date and time   History of Anesthesia Complications (+) Family history of anesthesia reaction  Airway Mallampati: II  TM Distance: >3 FB Neck ROM: full    Dental  (+) Teeth Intact   Pulmonary Current Smoker,    Pulmonary exam normal        Cardiovascular Normal cardiovascular exam+ Valvular Problems/Murmurs      Neuro/Psych    GI/Hepatic GERD  Controlled,  Endo/Other  Hypothyroidism   Renal/GU Renal disease     Musculoskeletal   Abdominal   Peds  Hematology   Anesthesia Other Findings Past Medical History: No date: Abnormal Pap smear of cervix No date: Chronic kidney disease     Comment:  CHRONIC  RIGHT PYELONEPHRITIS No date: Family history of adverse reaction to anesthesia     Comment:  PTS MOM WENT INTO COMA AFTER HYSTERECTOMY No date: Heart murmur No date: Hypothyroidism No date: Thyroid goiter Past Surgical History: 12/15/2014: REMOVAL OF DRUG DELIVERY IMPLANT; Left     Comment:  Procedure: REMOVAL OF DRUG DELIVERY IMPLANT;  Surgeon:               Nadara Mustard, MD;  Location: [facility] ORS;  Service:               Gynecology;  Laterality: Left;   Reproductive/Obstetrics                             Anesthesia Physical Anesthesia Plan  ASA: II  Anesthesia Plan: General LMA   Post-op Pain Management:    Induction: Intravenous  PONV Risk Score and Plan: Ondansetron and Treatment may vary due to age or medical condition  Airway Management Planned: LMA  Additional Equipment:   Intra-op Plan:   Post-operative Plan: Extubation in OR  Informed Consent: I have reviewed the patients History and Physical, chart, labs and discussed the procedure including the risks, benefits and alternatives for the  proposed anesthesia with the patient or authorized representative who has indicated his/her understanding and acceptance.   Dental Advisory Given  Plan Discussed with: CRNA  Anesthesia Plan Comments:         Anesthesia Quick Evaluation

## 2018-05-03 NOTE — Transfer of Care (Signed)
Immediate Anesthesia Transfer of Care Note  Patient: Brooke Aguirre  Procedure(s) Performed: LOOP ELECTROSURGICAL EXCISION PROCEDURE (LEEP) (N/A )  Patient Location: PACU  Anesthesia Type:General  Level of Consciousness: awake, alert , oriented and patient cooperative  Airway & Oxygen Therapy: Patient Spontanous Breathing and Patient connected to face mask oxygen  Post-op Assessment: Report given to RN and Post -op Vital signs reviewed and stable  Post vital signs: Reviewed and stable  Last Vitals:  Vitals Value Taken Time  BP 124/66 05/03/2018  1:07 PM  Temp    Pulse 94 05/03/2018  1:09 PM  Resp 17 05/03/2018  1:09 PM  SpO2 100 % 05/03/2018  1:09 PM  Vitals shown include unvalidated device data.  Last Pain:  Vitals:   05/03/18 1128  TempSrc: Temporal  PainSc: 0-No pain         Complications: No apparent anesthesia complications

## 2018-05-03 NOTE — Anesthesia Procedure Notes (Signed)
Procedure Name: LMA Insertion Date/Time: 05/03/2018 12:18 PM Performed by: Dava Najjar, CRNA Pre-anesthesia Checklist: Patient identified, Emergency Drugs available, Suction available, Patient being monitored and Timeout performed Patient Re-evaluated:Patient Re-evaluated prior to induction Oxygen Delivery Method: Circle system utilized Preoxygenation: Pre-oxygenation with 100% oxygen Induction Type: IV induction LMA: LMA inserted LMA Size: 4.0 Number of attempts: 1 Placement Confirmation: positive ETCO2 and breath sounds checked- equal and bilateral Tube secured with: Tape Dental Injury: Teeth and Oropharynx as per pre-operative assessment

## 2018-05-04 ENCOUNTER — Emergency Department
Admission: EM | Admit: 2018-05-04 | Discharge: 2018-05-04 | Disposition: A | Discharge status: Home / Self Care | Payer: Medicaid Other | Attending: Emergency Medicine | Admitting: Emergency Medicine

## 2018-05-04 ENCOUNTER — Other Ambulatory Visit: Payer: Self-pay

## 2018-05-04 DIAGNOSIS — L539 Erythematous condition, unspecified: Secondary | ICD-10-CM | POA: Insufficient documentation

## 2018-05-04 DIAGNOSIS — Z5321 Procedure and treatment not carried out due to patient leaving prior to being seen by health care provider: Secondary | ICD-10-CM | POA: Insufficient documentation

## 2018-05-04 LAB — BASIC METABOLIC PANEL
ANION GAP: 7 (ref 5–15)
BUN: 11 mg/dL (ref 6–20)
CO2: 23 mmol/L (ref 22–32)
Calcium: 9.2 mg/dL (ref 8.9–10.3)
Chloride: 111 mmol/L (ref 98–111)
Creatinine, Ser: 0.74 mg/dL (ref 0.44–1.00)
GFR calc Af Amer: >60 mL/min (ref >60)
GLUCOSE: 149 mg/dL — AB (ref 70–99)
POTASSIUM: 3.9 mmol/L (ref 3.5–5.1)
Sodium: 141 mmol/L (ref 135–145)

## 2018-05-04 LAB — CBC
HEMATOCRIT: 40.7 % (ref 36.0–46.0)
Hemoglobin: 13.9 g/dL (ref 12.0–15.0)
MCH: 32.7 pg (ref 26.0–34.0)
MCHC: 34.2 g/dL (ref 30.0–36.0)
MCV: 95.8 fL (ref 80.0–100.0)
Platelets: 247 10*3/uL (ref 150–400)
RBC: 4.25 MIL/uL (ref 3.87–5.11)
RDW: 11.9 % (ref 11.5–15.5)
WBC: 15.8 10*3/uL — ABNORMAL HIGH (ref 4.0–10.5)
nRBC: 0 % (ref 0.0–0.2)

## 2018-05-04 NOTE — ED Notes (Signed)
Pt is talking about leaving d/t children at home. Pt informed to stay and see the doctor. Pt asked "if I go home am I going to die?" pt informed that it does not appear at this time to be an anaphylactic reaction to any medications d/t throat not closing, no lip swelling, no hives. Pt verbalized understanding and stated "I don't want to be here all night."

## 2018-05-04 NOTE — ED Triage Notes (Signed)
Pt had "leap cone biopsy" performed yesterday. Pt states today noticed redness to face. Took ibuprofen and benadryl at home. States feels like face is burning. A&O. Speaking in complete sentences. No distress noted at this time.

## 2018-05-05 ENCOUNTER — Ambulatory Visit (INDEPENDENT_AMBULATORY_CARE_PROVIDER_SITE_OTHER): Payer: Medicaid Other | Admitting: Obstetrics and Gynecology

## 2018-05-05 ENCOUNTER — Encounter: Payer: Self-pay | Admitting: Obstetrics and Gynecology

## 2018-05-05 VITALS — BP 142/67 | HR 83 | Ht 59.0 in | Wt 209.3 lb

## 2018-05-05 DIAGNOSIS — L539 Erythematous condition, unspecified: Secondary | ICD-10-CM

## 2018-05-05 LAB — SURGICAL PATHOLOGY

## 2018-05-05 NOTE — Progress Notes (Signed)
Chief complaint: 1.  Facial rash with heat 2.  2 days postop-LEEP cone biopsy  Brooke Aguirre presents today for evaluation of facial rash with warmth noted 48 hours after her LEEP cone biopsy in the OR.  She did receive general anesthesia.  She had no perioperative issues other than a sore throat.  This is likely from the ET tube. Over the weekend she did have some congestion.  She did not have any documented fever.  Phone photos are reviewed and rash is consistent with typical pattern seen with fifths disease.  At the time of the onset of the rash she did go to the ER for evaluation, but left after 2 hours of waiting.  She did take Benadryl 25 mg twice with decrease in degree rash and fever symptoms.  She reported no shortness of breath, chills or sweats, nausea or vomiting.  OBJECTIVE: BP (!) 142/67   Pulse 83   Ht 4\' 11"  (1.499 m)   Wt 209 lb 4.8 oz (94.9 kg)   BMI 42.27 kg/m  Pleasant female in no acute distress.  Alert and oriented. HEENT: Normal face without significant hyperemia or rash. Neck: No adenopathy Lungs: Unlabored breathing; clear Heart: Mild tachycardia with no murmur  ASSESSMENT: 1.  Facial erythema noted 2 days status post LEEP cone biopsy. 2.  Uncertain regarding etiology of rash which has now resolved.  Differential includes possible delayed allergic reaction to medications in the OR versus B19 parvovirus infection.  PLAN: 1.  Reassurance given 2.  Patient may use Benadryl as needed if rash and heat to recur.  Patient may also use Tylenol if she has fever. 3.  Return in 2 weeks for postop check as scheduled  A total of 15 minutes were spent face-to-face with the patient during this encounter and over half of that time dealt with counseling and coordination of care.  Herold Harms, MD  Note: This dictation was prepared with Dragon dictation along with smaller phrase technology. Any transcriptional errors that result from this process are unintentional.

## 2018-05-05 NOTE — Patient Instructions (Signed)
1.  Suspect either allergic reaction, delayed, or viral infection with B19 parvovirus or   (fifths disease).  Virus infection is self-limited. 2.  Recommend Benadryl 25 mg orally if facial rash with erythema recurs along with Tylenol for any fever. 3.  Return as scheduled for postop check in 2 weeks.

## 2018-05-05 NOTE — Anesthesia Postprocedure Evaluation (Signed)
Anesthesia Post Note  Patient: Brooke Aguirre  Procedure(s) Performed: LOOP ELECTROSURGICAL EXCISION PROCEDURE (LEEP) (N/A )  Patient location during evaluation: PACU Anesthesia Type: General Level of consciousness: awake and alert Pain management: pain level controlled Vital Signs Assessment: post-procedure vital signs reviewed and stable Respiratory status: spontaneous breathing, nonlabored ventilation and respiratory function stable Cardiovascular status: blood pressure returned to baseline and stable Postop Assessment: no apparent nausea or vomiting Anesthetic complications: no     Last Vitals:  Vitals:   05/03/18 1351 05/03/18 1404  BP:  126/72  Pulse: (!) 101 94  Resp: 14 16  Temp: 36.9 C 36.9 C  SpO2: 96% 97%    Last Pain:  Vitals:   05/04/18 0930  TempSrc:   PainSc: 0-No pain                 Christia Reading

## 2018-05-10 ENCOUNTER — Telehealth: Payer: Self-pay | Admitting: Obstetrics and Gynecology

## 2018-05-10 ENCOUNTER — Telehealth: Payer: Self-pay

## 2018-05-10 NOTE — Telephone Encounter (Signed)
Pt is 7 days s/p leep. She is now having a blackish d/c. Pos odor- smells like old blood. Mild pp. No fevers. NO uti sx. Normal bm. Pt aware monsels was used for leep to stop bleeding. It will slough off and cause a black d/c. Advised to monitor for now. If fever or severe pain develop to contact office. Pt voices understanding.

## 2018-05-10 NOTE — Telephone Encounter (Signed)
Error

## 2018-05-25 ENCOUNTER — Encounter: Payer: Medicaid Other | Admitting: Obstetrics and Gynecology

## 2018-05-27 ENCOUNTER — Encounter: Payer: Self-pay | Admitting: Obstetrics and Gynecology

## 2018-05-27 ENCOUNTER — Ambulatory Visit (INDEPENDENT_AMBULATORY_CARE_PROVIDER_SITE_OTHER): Payer: Self-pay | Admitting: Obstetrics and Gynecology

## 2018-05-27 VITALS — BP 116/79 | Ht 59.0 in | Wt 208.0 lb

## 2018-05-27 DIAGNOSIS — Z9889 Other specified postprocedural states: Secondary | ICD-10-CM

## 2018-05-27 DIAGNOSIS — Z09 Encounter for follow-up examination after completed treatment for conditions other than malignant neoplasm: Secondary | ICD-10-CM

## 2018-05-27 DIAGNOSIS — N871 Moderate cervical dysplasia: Secondary | ICD-10-CM

## 2018-05-27 NOTE — Patient Instructions (Signed)
1.  Resume all activities without restriction 2.  Return in 6 months for Pap smear with Dr. Logan BoresEvans

## 2018-05-27 NOTE — Progress Notes (Signed)
Chief complaint: 1.  Postop check 2.  Status post LEEP cone biopsy for cervical dysplasia-CIN-2  Patient presents for postop check 4 weeks status post LEEP cone biopsy of the cervix for management of CIN-2 and inadequate colposcopy with inability to see the full extent of the lesion.  Findings of surgery included normal staining Lugol's pattern of the cervix and vagina  Pathology: DIAGNOSIS:  A. 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  - LOW-GRADE SQUAMOUS INTRAEPITHELIAL LESION (LSIL / CIN1) INVOLVING ONE  QUADRANT; MARGINS ARE NEGATIVE.   B. ENDOCERVICAL LEEP:  - ENDOCERVICAL TISSUE; NEGATIVE FOR DYSPLASIA AND MALIGNANCY.   C. ENDOCERVICAL CURETTINGS, POST LEEP:  - STRIPS OF ENDOCERVICAL GLANDULAR EPITHELIUM WITH FOCAL SQUAMOUS  METAPLASIA.  - NEGATIVE FOR DYSPLASIA AND MALIGNANCY.   Patient has done well post surgery.  Bowel bladder function are normal.  She is having occasional irregular spotting.  OBJECTIVE: BP 116/79   Ht 4\' 11"  (1.499 m)   Wt 208 lb (94.3 kg)   BMI 42.01 kg/m  Pleasant female no acute distress. Pelvic: External genitalia-normal BUS-normal Vagina-normal Cervix-healing cone bed without evidence of friability or discharge  ASSESSMENT: 1.  4 weeks status post LEEP cone biopsy for CIN-2 and inadequate colposcopy 2.  Pathology notable for CIN-2 with clear margins and negative post LEEP ECC  PLAN: 1.  Resume all activities without restriction 2.  Return in 6 months for Pap smear-Dr. Andrey CotaEvans  Brooke Aguirre A Emsley Custer, MD  Note: This dictation was prepared with Dragon dictation along with smaller phrase technology. Any transcriptional errors that result from this process are unintentional.

## 2018-07-09 ENCOUNTER — Emergency Department
Admission: EM | Admit: 2018-07-09 | Discharge: 2018-07-09 | Disposition: A | Discharge status: Home / Self Care | Payer: Medicaid Other | Attending: Emergency Medicine | Admitting: Emergency Medicine

## 2018-07-09 ENCOUNTER — Other Ambulatory Visit: Payer: Self-pay

## 2018-07-09 DIAGNOSIS — X58XXXA Exposure to other specified factors, initial encounter: Secondary | ICD-10-CM | POA: Insufficient documentation

## 2018-07-09 DIAGNOSIS — S0502XA Injury of conjunctiva and corneal abrasion without foreign body, left eye, initial encounter: Secondary | ICD-10-CM

## 2018-07-09 DIAGNOSIS — Y929 Unspecified place or not applicable: Secondary | ICD-10-CM | POA: Insufficient documentation

## 2018-07-09 DIAGNOSIS — E039 Hypothyroidism, unspecified: Secondary | ICD-10-CM | POA: Insufficient documentation

## 2018-07-09 DIAGNOSIS — Y9389 Activity, other specified: Secondary | ICD-10-CM | POA: Insufficient documentation

## 2018-07-09 DIAGNOSIS — Y999 Unspecified external cause status: Secondary | ICD-10-CM | POA: Insufficient documentation

## 2018-07-09 MED ORDER — TOBRAMYCIN 0.3 % OP SOLN: 2.0000 [drp] | OPHTHALMIC | 0 refills | Status: DC

## 2018-07-09 MED ORDER — TETRACAINE HCL 0.5 % OP SOLN
1.0000 [drp] | Freq: Once | OPHTHALMIC | Status: DC
  Filled 2018-07-09: qty 4

## 2018-07-09 MED ORDER — FLUORESCEIN SODIUM 1 MG OP STRP
1.0000 | ORAL_STRIP | Freq: Once | OPHTHALMIC | Status: DC
  Filled 2018-07-09: qty 1

## 2018-07-09 MED ORDER — EYE WASH OPHTH SOLN
1.0000 [drp] | OPHTHALMIC | Status: DC | PRN
  Filled 2018-07-09: qty 118

## 2018-07-09 NOTE — Discharge Instructions (Addendum)
Follow-up with [facility] if any continued problems.  Begin using the eyedrops that were sent to your pharmacy.  These drops are to be used in your left eye every 4 hours while you are awake.  You do not need to use them while you are asleep.  Also for the next 24 hours you will be extremely light sensitive.  Wear sunglasses to protect your eyes from bright light.  You may also take Tylenol if needed for pain.

## 2018-07-09 NOTE — ED Provider Notes (Signed)
[facility] Emergency Department Provider Note   ____________________________________________   First MD Initiated Contact with Patient 07/09/18 1312     (approximate)  I have reviewed the triage vital signs and the nursing notes.   HISTORY  Chief Complaint Eye Pain   HPI Brooke Aguirre is a 22 y.o. female is to the ED with complaint of left eye pain.  Patient states that she was getting in her car when a Strahl from her drink hit her in the eye.  This happened at approximately 11:30 AM.  Patient has continued to have discomfort in her left eye and a foreign body feeling.  She states she has seen a area on her cornea that "looks funny".  She rates her pain as 6 out of 10.  Past Medical History:  Diagnosis Date  . Abnormal Pap smear of cervix   . Chronic kidney disease    CHRONIC  RIGHT PYELONEPHRITIS  . Family history of adverse reaction to anesthesia    PTS MOM WENT INTO COMA AFTER HYSTERECTOMY  . Heart murmur   . Hypothyroidism   . Thyroid goiter     Patient Active Problem List   Diagnosis Date Noted  . Status post LEEP (loop electrosurgical excision procedure) of cervix 05/03/2018  . 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 Tobacco user 02/25/2018  . Obesity (BMI 30.0-34.9) 02/25/2018    Past Surgical History:  Procedure Laterality Date  . LEEP N/A 05/03/2018   Procedure: LOOP ELECTROSURGICAL EXCISION PROCEDURE (LEEP);  Surgeon: Herold Harmsefrancesco, Martin A, MD;  Location: [facility] ORS;  Service: Gynecology;  Laterality: N/A;  . REMOVAL OF DRUG DELIVERY IMPLANT Left 12/15/2014   Procedure: REMOVAL OF DRUG DELIVERY IMPLANT;  Surgeon: Nadara Mustardobert P Harris, MD;  Location: [facility] ORS;  Service: Gynecology;  Laterality: Left;    Prior to Admission medications   Medication Sig Start Date End Date Taking? Authorizing Provider  MELATONIN-CHAMOMILE PO Take 1 tablet by mouth at bedtime.    [provider]  PARAGARD INTRAUTERINE COPPER IU 1 each  by Intrauterine route once. Inserted 01/2016 - ws     [provider]  tobramycin (TOBREX) 0.3 % ophthalmic solution Place 2 drops into the left eye every 4 (four) hours. While awake 07/09/18   Tommi RumpsSummers, Fae Blossom L, PA-C    Allergies Kiwi extract; Amoxicillin; Penicillins; Tramadol; Cranberry; and Sulfa antibiotics  Family History  Problem Relation Age of Onset  . Breast cancer Neg Hx   . Ovarian cancer Neg Hx   . Colon cancer Neg Hx     Social History Social History   Tobacco Use  . Smoking status: Current Every Day Smoker    Packs/day: 1.00    Years: 5.00    Pack years: 5.00    Types: Cigarettes  . Smokeless tobacco: Never Used  Substance Use Topics  . Alcohol use: No  . Drug use: No    Review of Systems Constitutional: No fever/chills Eyes: Positive left eye pain.  Positive photophobia. ENT: No sore throat. Cardiovascular: Denies chest pain. Respiratory: Denies shortness of breath. Skin: Negative for rash. Neurological: Negative for headaches, focal weakness or numbness. ____________________________________________   PHYSICAL EXAM:  VITAL SIGNS: ED Triage Vitals [07/09/18 1254]  Enc Vitals Group     BP 129/78     Pulse Rate 96     Resp      Temp 99.1 F (37.3 C)     Temp Source Oral     SpO2 100 %  Weight 210 lb (95.3 kg)     Height 5' (1.524 m)     Head Circumference      Peak Flow      Pain Score 6     Pain Loc      Pain Edu?      Excl. in GC?     Constitutional: Alert and oriented. Well appearing and in no acute distress. Eyes: Conjunctivae are normal. PERRL. EOMI. mild photophobia.  Tetracaine was placed in the left eye.  Lid was everted no foreign body was noted.  There is a very shallow corneal abrasion noted on the lateral aspect at approximately 2:00 to 4 o'clock position.  No foreign bodies noted in the eye.  Fluorescein stain confirms corneal abrasion. Head: Atraumatic. Nose: No congestion/rhinnorhea. Neck: No stridor.     Cardiovascular: Normal rate, regular rhythm. Grossly normal heart sounds.  Good peripheral circulation. Respiratory: Normal respiratory effort.  No retractions. Lungs CTAB. Musculoskeletal: No lower extremity tenderness nor edema.  No joint effusions. Neurologic:  Normal speech and language. No gross focal neurologic deficits are appreciated.  Skin:  Skin is warm, dry and intact. No rash noted. Psychiatric: Mood and affect are normal. Speech and behavior are normal.  ____________________________________________   LABS (all labs ordered are listed, but only abnormal results are displayed)  Labs Reviewed - No data to display   PROCEDURES  Procedure(s) performed: As noted above with eye exam.  Procedures  Critical Care performed: No  ____________________________________________   INITIAL IMPRESSION / ASSESSMENT AND PLAN / ED COURSE  As part of my medical decision making, I reviewed the following data within the electronic MEDICAL RECORD NUMBER Notes from prior ED visits and [place] Controlled Substance Database  Patient presents to the ED with complaint of left eye pain.  Patient was getting into her car when her Strahl in her drink scraped her left eye.  She has had pain since that time along with photophobia.  Physical exam shows she does have a corneal abrasion to the lateral aspect at approximately 2:00 to 4:00 area.  Visual acuity was documented.  Patient was discharged with prescription for Tobrex ophthalmic solution 2 drops every 4 hours while awake.  She is to follow-up with [facility] if any continued problems the first of the week.  ____________________________________________   FINAL CLINICAL IMPRESSION(S) / ED DIAGNOSES  Final diagnoses:  Abrasion of left cornea, initial encounter     ED Discharge Orders         Ordered    tobramycin (TOBREX) 0.3 % ophthalmic solution  Every 4 hours     07/09/18 1348           Note:  This document was prepared using  Dragon voice recognition software and may include unintentional dictation errors.    Tommi RumpsSummers, Joseluis Alessio L, PA-C 07/09/18 1541    Emily FilbertWilliams, Jonathan E, MD 07/09/18 1600

## 2018-07-09 NOTE — ED Triage Notes (Signed)
Pt reports that she hit her left eye on her drink straw, states it started tearing up immediately, pain to move eye, abrasion noted to cornea

## 2018-07-09 NOTE — ED Notes (Signed)
Visual acuity screening 20/30 bil

## 2018-07-26 ENCOUNTER — Other Ambulatory Visit: Payer: Self-pay

## 2018-07-26 ENCOUNTER — Emergency Department
Admission: EM | Admit: 2018-07-26 | Discharge: 2018-07-26 | Disposition: A | Discharge status: Home / Self Care | Payer: Medicaid Other | Attending: Emergency Medicine | Admitting: Emergency Medicine

## 2018-07-26 ENCOUNTER — Encounter: Payer: Self-pay | Admitting: Emergency Medicine

## 2018-07-26 DIAGNOSIS — B9789 Other viral agents as the cause of diseases classified elsewhere: Secondary | ICD-10-CM | POA: Insufficient documentation

## 2018-07-26 DIAGNOSIS — J069 Acute upper respiratory infection, unspecified: Secondary | ICD-10-CM

## 2018-07-26 DIAGNOSIS — Z79899 Other long term (current) drug therapy: Secondary | ICD-10-CM | POA: Insufficient documentation

## 2018-07-26 DIAGNOSIS — F17210 Nicotine dependence, cigarettes, uncomplicated: Secondary | ICD-10-CM | POA: Insufficient documentation

## 2018-07-26 DIAGNOSIS — E039 Hypothyroidism, unspecified: Secondary | ICD-10-CM | POA: Insufficient documentation

## 2018-07-26 MED ORDER — DEXAMETHASONE SODIUM PHOSPHATE 10 MG/ML IJ SOLN
10.0000 mg | Freq: Once | INTRAMUSCULAR | Status: AC
  Administered 2018-07-26: 10 mg via INTRAMUSCULAR
  Filled 2018-07-26: qty 1

## 2018-07-26 MED ORDER — BENZONATATE 100 MG PO CAPS: 100.0000 mg | ORAL_CAPSULE | Freq: Three times a day (TID) | ORAL | 0 refills | Status: DC | PRN

## 2018-07-26 MED ORDER — FLUTICASONE PROPIONATE 50 MCG/ACT NA SUSP: 2.0000 | Freq: Every day | NASAL | 0 refills | Status: DC

## 2018-07-26 NOTE — ED Notes (Signed)
See triage note  Presents with nasal congestion,cough and subjective fever for couple of days  Afebrile on arrival

## 2018-07-26 NOTE — ED Provider Notes (Signed)
[facility] Emergency Department Provider Note  ____________________________________________  Time seen: Approximately 11:05 AM  I have reviewed the triage vital signs and the nursing notes.   HISTORY  Chief Complaint Nasal Congestion; Cough; and Generalized Body Aches    HPI Brooke Aguirre is a 23 y.o. female that presents to the emergency department for evaluation of nasal congestion, productive cough with clear sputum, body aches for 3 days.  Patient states that both children have been sick with ear infections and she was in and out of doctor offices with them all last week and probably caught something.  On Friday she started coughing.  Children also began to have URI symptoms but improved over the weekend.  She did not receive her flu vaccine this year.  She smokes a pack of cigarettes per day.  No fever, chills, shortness of breath, chest pain, vomiting, diarrhea.  Past Medical History:  Diagnosis Date  . Abnormal Pap smear of cervix   . Chronic kidney disease    CHRONIC  RIGHT PYELONEPHRITIS  . Family history of adverse reaction to anesthesia    PTS MOM WENT INTO COMA AFTER HYSTERECTOMY  . Heart murmur   . Hypothyroidism   . Thyroid goiter     Patient Active Problem List   Diagnosis Date Noted  . Status post LEEP (loop electrosurgical excision procedure) of cervix 05/03/2018  . 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 Tobacco user 02/25/2018  . Obesity (BMI 30.0-34.9) 02/25/2018    Past Surgical History:  Procedure Laterality Date  . LEEP N/A 05/03/2018   Procedure: LOOP ELECTROSURGICAL EXCISION PROCEDURE (LEEP);  Surgeon: Herold Harmsefrancesco, Martin A, MD;  Location: [facility] ORS;  Service: Gynecology;  Laterality: N/A;  . REMOVAL OF DRUG DELIVERY IMPLANT Left 12/15/2014   Procedure: REMOVAL OF DRUG DELIVERY IMPLANT;  Surgeon: Nadara Mustardobert P Harris, MD;  Location: [facility] ORS;  Service: Gynecology;  Laterality: Left;    Prior to Admission  medications   Medication Sig Start Date End Date Taking? Authorizing Provider  benzonatate (TESSALON PERLES) 100 MG capsule Take 1 capsule (100 mg total) by mouth 3 (three) times daily as needed for cough. 07/26/18 07/26/19  Enid DerryWagner, Joei Frangos, PA-C  fluticasone (FLONASE) 50 MCG/ACT nasal spray Place 2 sprays into both nostrils daily. 07/26/18 07/26/19  Enid DerryWagner, Chanson Teems, PA-C  MELATONIN-CHAMOMILE PO Take 1 tablet by mouth at bedtime.    [provider]  PARAGARD INTRAUTERINE COPPER IU 1 each by Intrauterine route once. Inserted 01/2016 - ws     [provider]  tobramycin (TOBREX) 0.3 % ophthalmic solution Place 2 drops into the left eye every 4 (four) hours. While awake 07/09/18   Tommi RumpsSummers, Rhonda L, PA-C    Allergies Kiwi extract; Amoxicillin; Penicillins; Tramadol; Cranberry; and Sulfa antibiotics  Family History  Problem Relation Age of Onset  . Breast cancer Neg Hx   . Ovarian cancer Neg Hx   . Colon cancer Neg Hx     Social History Social History   Tobacco Use  . Smoking status: Current Every Day Smoker    Packs/day: 1.00    Years: 5.00    Pack years: 5.00    Types: Cigarettes  . Smokeless tobacco: Never Used  Substance Use Topics  . Alcohol use: No  . Drug use: No     Review of Systems  Constitutional: No fever/chills Eyes: No visual changes. No discharge. ENT: Positive for congestion and rhinorrhea. Cardiovascular: No chest pain. Respiratory: Positive for cough. No SOB. Gastrointestinal:  No abdominal pain.  No nausea, no vomiting.   Musculoskeletal: Negative for musculoskeletal pain. Skin: Negative for rash, abrasions, lacerations, ecchymosis. Neurological: Negative for headaches.   ____________________________________________   PHYSICAL EXAM:  VITAL SIGNS: ED Triage Vitals  Enc Vitals Group     BP 07/26/18 0920 (!) 141/90     Pulse Rate 07/26/18 0920 (!) 103     Resp 07/26/18 0920 20     Temp 07/26/18 0920 98.4 F (36.9 C)     Temp Source  07/26/18 0920 Oral     SpO2 07/26/18 0920 98 %     Weight 07/26/18 0921 210 lb (95.3 kg)     Height 07/26/18 0921 4\' 11"  (1.499 m)     Head Circumference --      Peak Flow --      Pain Score 07/26/18 0921 0     Pain Loc --      Pain Edu? --      Excl. in GC? --      Constitutional: Alert and oriented. Well appearing and in no acute distress. Eyes: Conjunctivae are normal. PERRL. EOMI. No discharge. Head: Atraumatic. ENT: No frontal and maxillary sinus tenderness.      Ears: Tympanic membranes pearly gray with good landmarks. No discharge.      Nose: Mild congestion/rhinnorhea.      Mouth/Throat: Mucous membranes are moist. Oropharynx non-erythematous. Tonsils not enlarged. No exudates. Uvula midline. Neck: No stridor.   Hematological/Lymphatic/Immunilogical: No cervical lymphadenopathy. Cardiovascular: Normal rate, regular rhythm.  Good peripheral circulation. Respiratory: Normal respiratory effort without tachypnea or retractions. Lungs CTAB. Good air entry to the bases with no decreased or absent breath sounds. Gastrointestinal: Bowel sounds 4 quadrants. Soft and nontender to palpation. No guarding or rigidity. No palpable masses. No distention. Musculoskeletal: Full range of motion to all extremities. No gross deformities appreciated. Neurologic:  Normal speech and language. No gross focal neurologic deficits are appreciated.  Skin:  Skin is warm, dry and intact. No rash noted. Psychiatric: Mood and affect are normal. Speech and behavior are normal. Patient exhibits appropriate insight and judgement.   ____________________________________________   LABS (all labs ordered are listed, but only abnormal results are displayed)  Labs Reviewed - No data to display ____________________________________________  EKG   ____________________________________________  RADIOLOGY   No results found.  ____________________________________________    PROCEDURES  Procedure(s)  performed:    Procedures    Medications  dexamethasone (DECADRON) injection 10 mg (10 mg Intramuscular Given 07/26/18 1116)     ____________________________________________   INITIAL IMPRESSION / ASSESSMENT AND PLAN / ED COURSE  Pertinent labs & imaging results that were available during my care of the patient were reviewed by me and considered in my medical decision making (see chart for details).  Review of the  CSRS was performed in accordance of the [facility] prior to dispensing any controlled drugs.     Patient's diagnosis is consistent with viral URI. Vital signs and exam are reassuring.  Patient appears well.  She is outside of the window to begin Tamiflu.  Patient appears well and is staying well hydrated. Patient should alternate tylenol and ibuprofen for fever. Patient feels comfortable going home. Patient will be discharged home with prescriptions for [facility] and Flonase. Patient is to follow up with primary care as needed or otherwise directed. Patient is given ED precautions to return to the ED for any worsening or new symptoms.     ____________________________________________  FINAL CLINICAL IMPRESSION(S) / ED DIAGNOSES  Final diagnoses:  Viral URI with cough      NEW MEDICATIONS STARTED DURING THIS VISIT:  ED Discharge Orders         Ordered    benzonatate (TESSALON PERLES) 100 MG capsule  3 times daily PRN     07/26/18 1111    fluticasone (FLONASE) 50 MCG/ACT nasal spray  Daily     07/26/18 1111              This chart was dictated using voice recognition software/Dragon. Despite best efforts to proofread, errors can occur which can change the meaning. Any change was purely unintentional.    Enid Derry, PA-C 07/26/18 1538    Arnaldo Natal, MD 07/26/18 [phone]

## 2018-08-26 ENCOUNTER — Encounter: Payer: Medicaid Other | Admitting: Obstetrics and Gynecology

## 2018-09-01 ENCOUNTER — Emergency Department
Admission: EM | Admit: 2018-09-01 | Discharge: 2018-09-01 | Disposition: A | Discharge status: Home / Self Care | Payer: Medicaid Other | Attending: Emergency Medicine | Admitting: Emergency Medicine

## 2018-09-01 ENCOUNTER — Other Ambulatory Visit: Payer: Self-pay

## 2018-09-01 ENCOUNTER — Encounter: Payer: Self-pay | Admitting: *Deleted

## 2018-09-01 DIAGNOSIS — J111 Influenza due to unidentified influenza virus with other respiratory manifestations: Secondary | ICD-10-CM | POA: Insufficient documentation

## 2018-09-01 DIAGNOSIS — R69 Illness, unspecified: Secondary | ICD-10-CM

## 2018-09-01 DIAGNOSIS — Z79899 Other long term (current) drug therapy: Secondary | ICD-10-CM | POA: Insufficient documentation

## 2018-09-01 DIAGNOSIS — J40 Bronchitis, not specified as acute or chronic: Secondary | ICD-10-CM | POA: Insufficient documentation

## 2018-09-01 DIAGNOSIS — F17210 Nicotine dependence, cigarettes, uncomplicated: Secondary | ICD-10-CM | POA: Insufficient documentation

## 2018-09-01 LAB — INFLUENZA PANEL BY PCR (TYPE A & B)
Influenza A By PCR: POSITIVE — AB
Influenza B By PCR: NEGATIVE

## 2018-09-01 MED ORDER — BENZONATATE 100 MG PO CAPS: 100.0000 mg | ORAL_CAPSULE | Freq: Four times a day (QID) | ORAL | 0 refills | Status: DC | PRN

## 2018-09-01 MED ORDER — ALBUTEROL SULFATE HFA 108 (90 BASE) MCG/ACT IN AERS: 2.0000 | INHALATION_SPRAY | RESPIRATORY_TRACT | 0 refills | Status: DC | PRN

## 2018-09-01 MED ORDER — PREDNISONE 50 MG PO TABS: 50.0000 mg | ORAL_TABLET | Freq: Every day | ORAL | 0 refills | Status: DC

## 2018-09-01 NOTE — ED Triage Notes (Signed)
Pt states both children at home have flu a.  Pt reports a cough, bodyaches.  Pt alert

## 2018-09-01 NOTE — ED Notes (Signed)
Pt to the er for hot and cold chills, and cough. Both children are flu positive. Pt states she feels like she cant get a deep breath. Cough is dry. No wheezing heard. Pt is in no acute distress.

## 2018-09-01 NOTE — ED Provider Notes (Signed)
[facility] Emergency Department Provider Note  ____________________________________________  Time seen: Approximately 7:39 PM  I have reviewed the triage vital signs and the nursing notes.   HISTORY  Chief Complaint Influenza    HPI Brooke Aguirre is a 23 y.o. female who presents the emergency department complaining of mild headache, body aches, coughing, mild shortness of breath.  Patient reports that both of her children have been diagnosed with influenza A.  Patient states that she has not been running fevers, having significant nasal congestion or sore throat but is concerned that she may have either influenza or pneumonia.  No chronic medical problems.  Over-the-counter medications for his complaint with no significant relief.    Past Medical History:  Diagnosis Date  . Abnormal Pap smear of cervix   . Chronic kidney disease    CHRONIC  RIGHT PYELONEPHRITIS  . Family history of adverse reaction to anesthesia    PTS MOM WENT INTO COMA AFTER HYSTERECTOMY  . Heart murmur   . Hypothyroidism   . Thyroid goiter     Patient Active Problem List   Diagnosis Date Noted  . Status post LEEP (loop electrosurgical excision procedure) of cervix 05/03/2018  . 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 Tobacco user 02/25/2018  . Obesity (BMI 30.0-34.9) 02/25/2018    Past Surgical History:  Procedure Laterality Date  . LEEP N/A 05/03/2018   Procedure: LOOP ELECTROSURGICAL EXCISION PROCEDURE (LEEP);  Surgeon: Herold Harmsefrancesco, Martin A, MD;  Location: [facility] ORS;  Service: Gynecology;  Laterality: N/A;  . REMOVAL OF DRUG DELIVERY IMPLANT Left 12/15/2014   Procedure: REMOVAL OF DRUG DELIVERY IMPLANT;  Surgeon: Nadara Mustardobert P Harris, MD;  Location: [facility] ORS;  Service: Gynecology;  Laterality: Left;    Prior to Admission medications   Medication Sig Start Date End Date Taking? Authorizing Provider  albuterol (PROVENTIL HFA;VENTOLIN HFA) 108 (90 Base) MCG/ACT  inhaler Inhale 2 puffs into the lungs every 4 (four) hours as needed for wheezing or shortness of breath. 09/01/18   , Delorise RoyalsJonathan D, PA-C  benzonatate (TESSALON PERLES) 100 MG capsule Take 1 capsule (100 mg total) by mouth every 6 (six) hours as needed for cough. 09/01/18 09/01/19  , Delorise RoyalsJonathan D, PA-C  fluticasone (FLONASE) 50 MCG/ACT nasal spray Place 2 sprays into both nostrils daily. 07/26/18 07/26/19  Enid DerryWagner, Ashley, PA-C  MELATONIN-CHAMOMILE PO Take 1 tablet by mouth at bedtime.    [provider]  PARAGARD INTRAUTERINE COPPER IU 1 each by Intrauterine route once. Inserted 01/2016 - ws     [provider]  predniSONE (DELTASONE) 50 MG tablet Take 1 tablet (50 mg total) by mouth daily with breakfast. 09/01/18   , Delorise RoyalsJonathan D, PA-C  tobramycin (TOBREX) 0.3 % ophthalmic solution Place 2 drops into the left eye every 4 (four) hours. While awake 07/09/18   Tommi RumpsSummers, Rhonda L, PA-C    Allergies Kiwi extract; Amoxicillin; Penicillins; Tramadol; Cranberry; and Sulfa antibiotics  Family History  Problem Relation Age of Onset  . Breast cancer Neg Hx   . Ovarian cancer Neg Hx   . Colon cancer Neg Hx     Social History Social History   Tobacco Use  . Smoking status: Current Every Day Smoker    Packs/day: 1.00    Years: 5.00    Pack years: 5.00    Types: Cigarettes  . Smokeless tobacco: Never Used  Substance Use Topics  . Alcohol use: No  . Drug use: No     Review  of Systems  Constitutional: Possible fever/chills Eyes: No visual changes. No discharge ENT: No upper respiratory complaints. Cardiovascular: no chest pain. Respiratory: Positive cough. No SOB. Gastrointestinal: No abdominal pain.  No nausea, no vomiting.  No diarrhea.  No constipation. Musculoskeletal: Negative for musculoskeletal pain. Skin: Negative for rash, abrasions, lacerations, ecchymosis. Neurological: Negative for headaches, focal weakness or numbness. 10-point ROS otherwise  negative.  ____________________________________________   PHYSICAL EXAM:  VITAL SIGNS: ED Triage Vitals  Enc Vitals Group     BP 09/01/18 1851 138/87     Pulse Rate 09/01/18 1851 (!) 110     Resp 09/01/18 1851 20     Temp 09/01/18 1851 98.6 F (37 C)     Temp Source 09/01/18 1851 Oral     SpO2 09/01/18 1851 100 %     Weight 09/01/18 1849 210 lb (95.3 kg)     Height 09/01/18 1849 4\' 11"  (1.499 m)     Head Circumference --      Peak Flow --      Pain Score --      Pain Loc --      Pain Edu? --      Excl. in GC? --      Constitutional: Alert and oriented. Well appearing and in no acute distress. Eyes: Conjunctivae are normal. PERRL. EOMI. Head: Atraumatic. ENT:      Ears: EACs and TMs unremarkable bilaterally.      Nose: Minimal congestion/rhinnorhea.      Mouth/Throat: Mucous membranes are moist.  Neck: No stridor.   Hematological/Lymphatic/Immunilogical: No cervical lymphadenopathy. Cardiovascular: Normal rate, regular rhythm. Normal S1 and S2.  Good peripheral circulation. Respiratory: Normal respiratory effort without tachypnea or retractions. Lungs with scattered coarse breath sounds.  No wheezing or rales.Peri [place] air entry to the bases with no decreased or absent breath sounds. Musculoskeletal: Full range of motion to all extremities. No gross deformities appreciated. Neurologic:  Normal speech and language. No gross focal neurologic deficits are appreciated.  Skin:  Skin is warm, dry and intact. No rash noted. Psychiatric: Mood and affect are normal. Speech and behavior are normal. Patient exhibits appropriate insight and judgement.   ____________________________________________   LABS (all labs ordered are listed, but only abnormal results are displayed)  Labs Reviewed  INFLUENZA PANEL BY PCR (TYPE A & B)   ____________________________________________  EKG   ____________________________________________  RADIOLOGY   No results  found.  ____________________________________________    PROCEDURES  Procedure(s) performed:    Procedures    Medications - No data to display   ____________________________________________   INITIAL IMPRESSION / ASSESSMENT AND PLAN / ED COURSE  Pertinent labs & imaging results that were available during my care of the patient were reviewed by me and considered in my medical decision making (see chart for details).  Review of the [place] CSRS was performed in accordance of the [facility] prior to dispensing any controlled drugs.      Patient's diagnosis is consistent with flulike symptoms, bronchitis.  Patient presents emergency department with complaints of flulike symptoms.  Findings are consistent with mild bronchitis, possible influenza.. Patient will be discharged home with prescriptions for prednisone, albuterol, Tessalon Perles.  Discussed Tamiflu and patient is against Tamiflu which I concur with.  Tylenol Motrin for fever.  Patient may call back tomorrow for results of influenza test..  Patient is given ED precautions to return to the ED for any worsening or new symptoms.     ____________________________________________  FINAL CLINICAL IMPRESSION(S) / ED  DIAGNOSES  Final diagnoses:  Influenza-like illness  Bronchitis      NEW MEDICATIONS STARTED DURING THIS VISIT:  ED Discharge Orders         Ordered    benzonatate (TESSALON PERLES) 100 MG capsule  Every 6 hours PRN     09/01/18 2001    albuterol (PROVENTIL HFA;VENTOLIN HFA) 108 (90 Base) MCG/ACT inhaler  Every 4 hours PRN     09/01/18 2001    predniSONE (DELTASONE) 50 MG tablet  Daily with breakfast     09/01/18 2001              This chart was dictated using voice recognition software/Dragon. Despite best efforts to proofread, errors can occur which can change the meaning. Any change was purely unintentional.    Lanette Hampshire 09/01/18 2002    Rockne Menghini, MD 09/01/18  2226

## 2018-10-12 ENCOUNTER — Emergency Department
Admission: EM | Admit: 2018-10-12 | Discharge: 2018-10-12 | Disposition: A | Discharge status: Home / Self Care | Payer: Self-pay | Attending: Emergency Medicine | Admitting: Emergency Medicine

## 2018-10-12 ENCOUNTER — Emergency Department: Payer: Self-pay

## 2018-10-12 ENCOUNTER — Other Ambulatory Visit: Payer: Self-pay

## 2018-10-12 ENCOUNTER — Encounter: Payer: Self-pay | Admitting: Intensive Care

## 2018-10-12 DIAGNOSIS — E039 Hypothyroidism, unspecified: Secondary | ICD-10-CM | POA: Insufficient documentation

## 2018-10-12 DIAGNOSIS — F17210 Nicotine dependence, cigarettes, uncomplicated: Secondary | ICD-10-CM | POA: Insufficient documentation

## 2018-10-12 DIAGNOSIS — J40 Bronchitis, not specified as acute or chronic: Secondary | ICD-10-CM | POA: Insufficient documentation

## 2018-10-12 DIAGNOSIS — Z79899 Other long term (current) drug therapy: Secondary | ICD-10-CM | POA: Insufficient documentation

## 2018-10-12 DIAGNOSIS — R070 Pain in throat: Secondary | ICD-10-CM | POA: Insufficient documentation

## 2018-10-12 DIAGNOSIS — J029 Acute pharyngitis, unspecified: Secondary | ICD-10-CM

## 2018-10-12 DIAGNOSIS — M542 Cervicalgia: Secondary | ICD-10-CM | POA: Insufficient documentation

## 2018-10-12 DIAGNOSIS — R51 Headache: Secondary | ICD-10-CM | POA: Insufficient documentation

## 2018-10-12 HISTORY — DX: Papillomavirus as the cause of diseases classified elsewhere: B97.7

## 2018-10-12 LAB — CBC WITH DIFFERENTIAL/PLATELET
ABS IMMATURE GRANULOCYTES: 0.07 10*3/uL (ref 0.00–0.07)
BASOS ABS: 0.1 10*3/uL (ref 0.0–0.1)
Basophils Relative: 1 %
EOS PCT: 1 %
Eosinophils Absolute: 0.1 10*3/uL (ref 0.0–0.5)
HCT: 39.3 % (ref 36.0–46.0)
HEMOGLOBIN: 13.9 g/dL (ref 12.0–15.0)
Immature Granulocytes: 1 %
LYMPHS PCT: 29 %
Lymphs Abs: 4.1 10*3/uL — ABNORMAL HIGH (ref 0.7–4.0)
MCH: 33.1 pg (ref 26.0–34.0)
MCHC: 35.4 g/dL (ref 30.0–36.0)
MCV: 93.6 fL (ref 80.0–100.0)
MONO ABS: 0.8 10*3/uL (ref 0.1–1.0)
Monocytes Relative: 6 %
NRBC: 0 % (ref 0.0–0.2)
Neutro Abs: 9 10*3/uL — ABNORMAL HIGH (ref 1.7–7.7)
Neutrophils Relative %: 62 %
Platelets: 256 10*3/uL (ref 150–400)
RBC: 4.2 MIL/uL (ref 3.87–5.11)
RDW: 11.9 % (ref 11.5–15.5)
WBC: 14.1 10*3/uL — AB (ref 4.0–10.5)

## 2018-10-12 LAB — BASIC METABOLIC PANEL
Anion gap: 10 (ref 5–15)
BUN: 11 mg/dL (ref 6–20)
CHLORIDE: 107 mmol/L (ref 98–111)
CO2: 22 mmol/L (ref 22–32)
CREATININE: 0.58 mg/dL (ref 0.44–1.00)
Calcium: 9.3 mg/dL (ref 8.9–10.3)
GFR calc Af Amer: >60 mL/min (ref >60)
GFR calc non Af Amer: >60 mL/min (ref >60)
Glucose, Bld: 93 mg/dL (ref 70–99)
Potassium: 3.8 mmol/L (ref 3.5–5.1)
SODIUM: 139 mmol/L (ref 135–145)

## 2018-10-12 LAB — GROUP A STREP BY PCR: GROUP A STREP BY PCR: NOT DETECTED

## 2018-10-12 MED ORDER — AZITHROMYCIN 500 MG PO TABS
500.0000 mg | ORAL_TABLET | Freq: Once | ORAL | Status: AC
  Administered 2018-10-12: 500 mg via ORAL
  Filled 2018-10-12: qty 1

## 2018-10-12 MED ORDER — AZITHROMYCIN 250 MG PO TABS: ORAL_TABLET | ORAL | 0 refills | Status: AC

## 2018-10-12 NOTE — ED Notes (Signed)
E-signature not working at this time. Pt verbalized understanding of D/C instructions. No further questions at this time. Pt in NAD at time of D.C. 

## 2018-10-12 NOTE — Discharge Instructions (Signed)
Use Tylenol or Motrin for the pain.  Please return for higher fever increasing shortness of breath or feeling sicker.  Please also return if you not any better in 2 or 3 days.  Take the Zithromax as directed.

## 2018-10-12 NOTE — ED Provider Notes (Signed)
[facility] Emergency Department Provider Note   ____________________________________________   First MD Initiated Contact with Patient 10/12/18 1845     (approximate)  I have reviewed the triage vital signs and the nursing notes.   HISTORY  Chief Complaint Cough and Sore Throat    HPI Brooke Aguirre is a 23 y.o. female patient reports having a cough productive of brown phlegm for about 1-1/2 weeks.  She also has a headache sore throat pain in the left side of her neck and behind the left ear.  She has chills at home but no fever.  No history of travel.        Past Medical History:  Diagnosis Date  . Abnormal Pap smear of cervix   . Chronic kidney disease    CHRONIC  RIGHT PYELONEPHRITIS  . Family history of adverse reaction to anesthesia    PTS MOM WENT INTO COMA AFTER HYSTERECTOMY  . Heart murmur   . HPV in female   . Hypothyroidism   . Thyroid goiter     Patient Active Problem List   Diagnosis Date Noted  . Status post LEEP (loop electrosurgical excision procedure) of cervix 05/03/2018  . 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 Tobacco user 02/25/2018  . Obesity (BMI 30.0-34.9) 02/25/2018    Past Surgical History:  Procedure Laterality Date  . LEEP N/A 05/03/2018   Procedure: LOOP ELECTROSURGICAL EXCISION PROCEDURE (LEEP);  Surgeon: Herold Harms, MD;  Location: [facility] ORS;  Service: Gynecology;  Laterality: N/A;  . REMOVAL OF DRUG DELIVERY IMPLANT Left 12/15/2014   Procedure: REMOVAL OF DRUG DELIVERY IMPLANT;  Surgeon: Nadara Mustard, MD;  Location: [facility] ORS;  Service: Gynecology;  Laterality: Left;    Prior to Admission medications   Medication Sig Start Date End Date Taking? Authorizing Provider  albuterol (PROVENTIL HFA;VENTOLIN HFA) 108 (90 Base) MCG/ACT inhaler Inhale 2 puffs into the lungs every 4 (four) hours as needed for wheezing or shortness of breath. 09/01/18   Cuthriell, Delorise Royals, PA-C   azithromycin (ZITHROMAX Z-PAK) 250 MG tablet Take 2 tablets (500 mg) on  Day 1,  followed by 1 tablet (250 mg) once daily on Days 2 through 5. 10/12/18 10/17/18  Arnaldo Natal, MD  benzonatate (TESSALON PERLES) 100 MG capsule Take 1 capsule (100 mg total) by mouth every 6 (six) hours as needed for cough. 09/01/18 09/01/19  Cuthriell, Delorise Royals, PA-C  fluticasone (FLONASE) 50 MCG/ACT nasal spray Place 2 sprays into both nostrils daily. 07/26/18 07/26/19  Enid Derry, PA-C  MELATONIN-CHAMOMILE PO Take 1 tablet by mouth at bedtime.    [provider]  PARAGARD INTRAUTERINE COPPER IU 1 each by Intrauterine route once. Inserted 01/2016 - ws     [provider]  predniSONE (DELTASONE) 50 MG tablet Take 1 tablet (50 mg total) by mouth daily with breakfast. 09/01/18   Cuthriell, Delorise Royals, PA-C  tobramycin (TOBREX) 0.3 % ophthalmic solution Place 2 drops into the left eye every 4 (four) hours. While awake 07/09/18   Tommi Rumps, PA-C    Allergies Kiwi extract; Amoxicillin; Penicillins; Tramadol; Cranberry; and Sulfa antibiotics  Family History  Problem Relation Age of Onset  . Breast cancer Neg Hx   . Ovarian cancer Neg Hx   . Colon cancer Neg Hx     Social History Social History   Tobacco Use  . Smoking status: Current Every Day Smoker    Packs/day: 1.00    Years: 5.00  Pack years: 5.00    Types: Cigarettes  . Smokeless tobacco: Never Used  Substance Use Topics  . Alcohol use: Yes    Alcohol/week: 9.0 standard drinks    Types: 9 Glasses of wine per week  . Drug use: No    Review of Systems  Constitutional: No fever some chills Eyes: No visual changes. RKY:HCWC throat. Cardiovascular: Denies chest pain. Respiratory: Some shortness of breath. Gastrointestinal: No abdominal pain.  No nausea, no vomiting.  No diarrhea.  No constipation. Genitourinary: Negative for dysuria. Musculoskeletal: Negative for back pain. Skin: Negative for rash. Neurological:  Negative for headaches, focal weakness   ____________________________________________   PHYSICAL EXAM:  VITAL SIGNS: ED Triage Vitals  Enc Vitals Group     BP 10/12/18 1840 130/73     Pulse Rate 10/12/18 1839 (!) 110     Resp 10/12/18 1839 18     Temp 10/12/18 1839 98.3 F (36.8 C)     Temp Source 10/12/18 1839 Oral     SpO2 10/12/18 1839 98 %     Weight 10/12/18 1839 210 lb (95.3 kg)     Height 10/12/18 1839 4\' 11"  (1.499 m)     Head Circumference --      Peak Flow --      Pain Score 10/12/18 1839 6     Pain Loc --      Pain Edu? --      Excl. in GC? --     Constitutional: Alert and oriented. Well appearing and in no acute distress. Eyes: Conjunctivae are normal.  Head: Atraumatic. Nose: No congestion/rhinnorhea. Mouth/Throat: Mucous membranes are moist.  Oropharynx non-erythematous. Neck: No stridor. Hematological/Lymphatic/Immunilogical: No cervical lymphadenopathy. Cardiovascular: Normal rate, regular rhythm. Grossly normal heart sounds.  Good peripheral circulation. Respiratory: Normal respiratory effort.  No retractions. Lungs CTAB. Gastrointestinal: Soft and nontender. No distention. No abdominal bruits. No CVA tenderness. Musculoskeletal: No lower extremity tenderness nor edema.   Neurologic:  Normal speech and language. No gross focal neurologic deficits are appreciated Skin:  Skin is warm, dry and intact. No rash noted. Psychiatric: Mood and affect are normal. Speech and behavior are normal.  ____________________________________________   LABS (all labs ordered are listed, but only abnormal results are displayed)  Labs Reviewed  CBC WITH DIFFERENTIAL/PLATELET - Abnormal; Notable for the following components:      Result Value   WBC 14.1 (*)    Neutro Abs 9.0 (*)    Lymphs Abs 4.1 (*)    All other components within normal limits  GROUP A STREP BY PCR  BASIC METABOLIC PANEL   ____________________________________________  EKG    ____________________________________________  RADIOLOGY  ED MD interpretation: Chest x-ray read by radiology reviewed by me is negative  Official radiology report(s): Dg Chest Portable 1 View  Result Date: 10/12/2018 CLINICAL DATA:  Productive cough. EXAM: PORTABLE CHEST 1 VIEW COMPARISON:  Radiographs of January 22, 2017. FINDINGS: The heart size and mediastinal contours are within normal limits. Both lungs are clear. The visualized skeletal structures are unremarkable. IMPRESSION: No active disease. Electronically Signed   By: Lupita Raider, M.D.   On: 10/12/2018 19:13    ____________________________________________   PROCEDURES  Procedure(s) performed (including Critical Care):  Procedures   ____________________________________________   INITIAL IMPRESSION / ASSESSMENT AND PLAN / ED COURSE  Patient has a productive cough with brown phlegm which is often associated with strep.  She also has a sore throat and a non-severe headache.  Her white count is elevated with  a predominance of polys.  We will treat her with Zithromax.  Have her return if she is worse or not any better in 2 or 3 days.             ____________________________________________   FINAL CLINICAL IMPRESSION(S) / ED DIAGNOSES  Final diagnoses:  Sore throat  Bronchitis     ED Discharge Orders         Ordered    azithromycin (ZITHROMAX Z-PAK) 250 MG tablet     10/12/18 2012           Note:  This document was prepared using Dragon voice recognition software and may include unintentional dictation errors.    Arnaldo Natal, MD 10/12/18 2012

## 2018-10-12 NOTE — ED Triage Notes (Addendum)
Patient reports having productive cough- brown phlegm  X 1 1/2weeks, headache, sore throat, ear pain, body aches,chills. Patient reports checking fever at home and was always afebrile

## 2018-10-24 ENCOUNTER — Telehealth: Payer: Medicaid Other | Admitting: Family

## 2018-10-24 DIAGNOSIS — R05 Cough: Secondary | ICD-10-CM

## 2018-10-24 DIAGNOSIS — R059 Cough, unspecified: Secondary | ICD-10-CM

## 2018-10-24 MED ORDER — DOXYCYCLINE HYCLATE 100 MG PO TABS: 100.0000 mg | ORAL_TABLET | Freq: Two times a day (BID) | ORAL | 0 refills | Status: DC

## 2018-10-24 MED ORDER — BENZONATATE 100 MG PO CAPS: 100.0000 mg | ORAL_CAPSULE | Freq: Three times a day (TID) | ORAL | 0 refills | Status: DC | PRN

## 2018-10-24 NOTE — Progress Notes (Signed)
We are sorry that you are not feeling well.  Here is how we plan to help!  Based on your presentation I believe you most likely have A cough due to bacteria.  When patients have a fever and a productive cough with a change in color or increased sputum production, we are concerned about bacterial bronchitis.  If left untreated it can progress to pneumonia.  If your symptoms do not improve with your treatment plan it is important that you contact your provider.   I have prescribed Doxycycline 100 mg twice a day for 7 days     In addition you may use A non-prescription cough medication called Robitussin DAC. Take 2 teaspoons every 8 hours or Delsym: take 2 teaspoons every 12 hours. and A non-prescription cough medication called Mucinex DM: take 2 tablets every 12 hours.  *If your symptoms worsen or does not improve you need to follow up face to face to be evaluated.   Approximately 5 minutes was spent documenting and reviewing patient's chart.    From your responses in the eVisit questionnaire you describe inflammation in the upper respiratory tract which is causing a significant cough.  This is commonly called Bronchitis and has four common causes:    Allergies  Viral Infections  Acid Reflux  Bacterial Infection Allergies, viruses and acid reflux are treated by controlling symptoms or eliminating the cause. An example might be a cough caused by taking certain blood pressure medications. You stop the cough by changing the medication. Another example might be a cough caused by acid reflux. Controlling the reflux helps control the cough.  USE OF BRONCHODILATOR ("RESCUE") INHALERS: There is a risk from using your bronchodilator too frequently.  The risk is that over-reliance on a medication which only relaxes the muscles surrounding the breathing tubes can reduce the effectiveness of medications prescribed to reduce swelling and congestion of the tubes themselves.  Although you feel brief relief  from the bronchodilator inhaler, your asthma may actually be worsening with the tubes becoming more swollen and filled with mucus.  This can delay other crucial treatments, such as oral steroid medications. If you need to use a bronchodilator inhaler daily, several times per day, you should discuss this with your provider.  There are probably better treatments that could be used to keep your asthma under control.     HOME CARE . Only take medications as instructed by your medical team. . Complete the entire course of an antibiotic. . Drink plenty of fluids and get plenty of rest. . Avoid close contacts especially the very young and the elderly . Cover your mouth if you cough or cough into your sleeve. . Always remember to wash your hands . A steam or ultrasonic humidifier can help congestion.   GET HELP RIGHT AWAY IF: . You develop worsening fever. . You become short of breath . You cough up blood. . Your symptoms persist after you have completed your treatment plan MAKE SURE YOU   Understand these instructions.  Will watch your condition.  Will get help right away if you are not doing well or get worse.  Your e-visit answers were reviewed by a board certified advanced clinical practitioner to complete your personal care plan.  Depending on the condition, your plan could have included both over the counter or prescription medications. If there is a problem please reply  once you have received a response from your provider. Your safety is important to us.  If you have drug  allergies check your prescription carefully.    You can use MyChart to ask questions about today's visit, request a non-urgent call back, or ask for a work or school excuse for 24 hours related to this e-Visit. If it has been greater than 24 hours you will need to follow up with your provider, or enter a new e-Visit to address those concerns. You will get an e-mail in the next two days asking about your experience.  I  hope that your e-visit has been valuable and will speed your recovery. Thank you for using e-visits.

## 2018-11-18 ENCOUNTER — Telehealth: Payer: Medicaid Other | Admitting: Physician Assistant

## 2018-11-18 ENCOUNTER — Other Ambulatory Visit: Payer: Self-pay

## 2018-11-18 ENCOUNTER — Emergency Department: Payer: Medicaid Other

## 2018-11-18 ENCOUNTER — Emergency Department
Admission: EM | Admit: 2018-11-18 | Discharge: 2018-11-18 | Disposition: A | Discharge status: Home / Self Care | Payer: Medicaid Other | Attending: Emergency Medicine | Admitting: Emergency Medicine

## 2018-11-18 ENCOUNTER — Encounter: Payer: Self-pay | Admitting: *Deleted

## 2018-11-18 DIAGNOSIS — Z79899 Other long term (current) drug therapy: Secondary | ICD-10-CM | POA: Insufficient documentation

## 2018-11-18 DIAGNOSIS — Z20828 Contact with and (suspected) exposure to other viral communicable diseases: Secondary | ICD-10-CM | POA: Insufficient documentation

## 2018-11-18 DIAGNOSIS — E039 Hypothyroidism, unspecified: Secondary | ICD-10-CM | POA: Insufficient documentation

## 2018-11-18 DIAGNOSIS — R05 Cough: Secondary | ICD-10-CM | POA: Insufficient documentation

## 2018-11-18 DIAGNOSIS — J069 Acute upper respiratory infection, unspecified: Secondary | ICD-10-CM

## 2018-11-18 DIAGNOSIS — R059 Cough, unspecified: Secondary | ICD-10-CM

## 2018-11-18 DIAGNOSIS — F17210 Nicotine dependence, cigarettes, uncomplicated: Secondary | ICD-10-CM | POA: Insufficient documentation

## 2018-11-18 LAB — CBC WITH DIFFERENTIAL/PLATELET
Abs Immature Granulocytes: 0.04 10*3/uL (ref 0.00–0.07)
Basophils Absolute: 0.1 10*3/uL (ref 0.0–0.1)
Basophils Relative: 1 %
Eosinophils Absolute: 0.2 10*3/uL (ref 0.0–0.5)
Eosinophils Relative: 2 %
HCT: 42.4 % (ref 36.0–46.0)
Hemoglobin: 14.9 g/dL (ref 12.0–15.0)
Immature Granulocytes: 0 %
Lymphocytes Relative: 27 %
Lymphs Abs: 2.6 10*3/uL (ref 0.7–4.0)
MCH: 33.3 pg (ref 26.0–34.0)
MCHC: 35.1 g/dL (ref 30.0–36.0)
MCV: 94.9 fL (ref 80.0–100.0)
Monocytes Absolute: 0.7 10*3/uL (ref 0.1–1.0)
Monocytes Relative: 7 %
Neutro Abs: 6 10*3/uL (ref 1.7–7.7)
Neutrophils Relative %: 63 %
Platelets: 234 10*3/uL (ref 150–400)
RBC: 4.47 MIL/uL (ref 3.87–5.11)
RDW: 12.1 % (ref 11.5–15.5)
WBC: 9.5 10*3/uL (ref 4.0–10.5)
nRBC: 0 % (ref 0.0–0.2)

## 2018-11-18 LAB — BASIC METABOLIC PANEL
Anion gap: 10 (ref 5–15)
BUN: 13 mg/dL (ref 6–20)
CO2: 23 mmol/L (ref 22–32)
Calcium: 9.1 mg/dL (ref 8.9–10.3)
Chloride: 107 mmol/L (ref 98–111)
Creatinine, Ser: 0.76 mg/dL (ref 0.44–1.00)
GFR calc Af Amer: >60 mL/min (ref >60)
GFR calc non Af Amer: >60 mL/min (ref >60)
Glucose, Bld: 92 mg/dL (ref 70–99)
Potassium: 3.9 mmol/L (ref 3.5–5.1)
Sodium: 140 mmol/L (ref 135–145)

## 2018-11-18 NOTE — Discharge Instructions (Addendum)
Your exam, labs, and CXR are all normal. Continue with your home meds as directed. Follow-up with Dr. Lacie Scotts as needed. Return to the ED for any worsening symptoms.

## 2018-11-18 NOTE — ED Provider Notes (Signed)
[facility] Emergency Department Provider Note ____________________________________________  Time seen: 1913  I have reviewed the triage vital signs and the nursing notes.  HISTORY  Chief Complaint  Cough  HPI Brooke Aguirre is a 23 y.o. female who is a current everyday smoker, reports for persistent, intermittent last month.  Patient describes recently being treated for bronchitis after a telemetry visit with her provider.  She has completed over the last 2 months a Z-Pak as well as a 10-day doxycycline course.  She describes the cough as dry but intermittently productive, especially in the morning.  She denies any dyspnea on exertion, chest pain, mopped assist, cough induced vomiting, or or shortness of breath.  She also denies any interim fevers, chills, or sweats.  She is denied any sick contacts, recent travel, or high risk exposures.  Her concern for COVID-19 is quite low at this time.  She is currently on unemployment and mom with 2 young children.  She is presenting here for evaluation of persistent cough as above.  Past Medical History:  Diagnosis Date  . Abnormal Pap smear of cervix   . Chronic kidney disease    CHRONIC  RIGHT PYELONEPHRITIS  . Family history of adverse reaction to anesthesia    PTS MOM WENT INTO COMA AFTER HYSTERECTOMY  . Heart murmur   . HPV in female   . Hypothyroidism   . Thyroid goiter     Patient Active Problem List   Diagnosis Date Noted  . Status post LEEP (loop electrosurgical excision procedure) of cervix 05/03/2018  . 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 Tobacco user 02/25/2018  . Obesity (BMI 30.0-34.9) 02/25/2018    Past Surgical History:  Procedure Laterality Date  . LEEP N/A 05/03/2018   Procedure: LOOP ELECTROSURGICAL EXCISION PROCEDURE (LEEP);  Surgeon: Herold Harmsefrancesco, Martin A, MD;  Location: [facility] ORS;  Service: Gynecology;  Laterality: N/A;  . REMOVAL OF DRUG DELIVERY IMPLANT Left 12/15/2014   Procedure: REMOVAL OF DRUG DELIVERY IMPLANT;  Surgeon: Nadara Mustardobert P Harris, MD;  Location: [facility] ORS;  Service: Gynecology;  Laterality: Left;    Prior to Admission medications   Medication Sig Start Date End Date Taking? Authorizing Provider  albuterol (PROVENTIL HFA;VENTOLIN HFA) 108 (90 Base) MCG/ACT inhaler Inhale 2 puffs into the lungs every 4 (four) hours as needed for wheezing or shortness of breath. 09/01/18   Cuthriell, Delorise RoyalsJonathan D, PA-C  benzonatate (TESSALON PERLES) 100 MG capsule Take 1 capsule (100 mg total) by mouth 3 (three) times daily as needed. 10/24/18   Junie SpencerHawks, Christy A, FNP  fluticasone (FLONASE) 50 MCG/ACT nasal spray Place 2 sprays into both nostrils daily. 07/26/18 07/26/19  Enid DerryWagner, Ashley, PA-C  MELATONIN-CHAMOMILE PO Take 1 tablet by mouth at bedtime.    [provider]  PARAGARD INTRAUTERINE COPPER IU 1 each by Intrauterine route once. Inserted 01/2016 - ws     [provider]  predniSONE (DELTASONE) 50 MG tablet Take 1 tablet (50 mg total) by mouth daily with breakfast. 09/01/18   Cuthriell, Delorise RoyalsJonathan D, PA-C  tobramycin (TOBREX) 0.3 % ophthalmic solution Place 2 drops into the left eye every 4 (four) hours. While awake 07/09/18   Tommi RumpsSummers, Rhonda L, PA-C    Allergies Kiwi extract; Amoxicillin; Penicillins; Tramadol; Cranberry; and Sulfa antibiotics  Family History  Problem Relation Age of Onset  . Breast cancer Neg Hx   . Ovarian cancer Neg Hx   . Colon cancer Neg Hx     Social History Social  History   Tobacco Use  . Smoking status: Current Every Day Smoker    Packs/day: 1.00    Years: 5.00    Pack years: 5.00    Types: Cigarettes  . Smokeless tobacco: Never Used  Substance Use Topics  . Alcohol use: Yes    Alcohol/week: 9.0 standard drinks    Types: 9 Glasses of wine per week  . Drug use: No    Review of Systems  Constitutional: Negative for fever. Eyes: Negative for visual changes. ENT: Negative for sore throat. Cardiovascular: Negative  for chest pain. Respiratory: Negative for shortness of breath. Gastrointestinal: Negative for abdominal pain, vomiting and diarrhea. Genitourinary: Negative for dysuria. Musculoskeletal: Negative for back pain. Skin: Negative for rash. Neurological: Negative for headaches, focal weakness or numbness. ____________________________________________  PHYSICAL EXAM:  VITAL SIGNS: ED Triage Vitals  Enc Vitals Group     BP 11/18/18 1848 (!) 134/91     Pulse Rate 11/18/18 1848 (!) 110     Resp 11/18/18 1848 20     Temp 11/18/18 1848 98.7 F (37.1 C)     Temp Source 11/18/18 1848 Oral     SpO2 11/18/18 1848 99 %     Weight 11/18/18 1847 210 lb (95.3 kg)     Height 11/18/18 1847 4\' 11"  (1.499 m)     Head Circumference --      Peak Flow --      Pain Score 11/18/18 1847 0     Pain Loc --      Pain Edu? --      Excl. in GC? --     Constitutional: Alert and oriented. Well appearing and in no distress. Head: Normocephalic and atraumatic. Eyes: Conjunctivae are normal. PERRL. Normal extraocular movements Ears: Canals clear. TMs intact bilaterally. Nose: No congestion/rhinorrhea/epistaxis. Mouth/Throat: Mucous membranes are moist. Neck: Supple. No thyromegaly. Hematological/Lymphatic/Immunological: No cervical lymphadenopathy. Cardiovascular: Normal rate, regular rhythm. Normal distal pulses. Respiratory: Normal respiratory effort. No wheezes/rales/rhonchi. Gastrointestinal: Soft and nontender. No distention. Musculoskeletal: Nontender with normal range of motion in all extremities.  Neurologic:  Normal gait without ataxia. Normal speech and language. No gross focal neurologic deficits are appreciated. Skin:  Skin is warm, dry and intact. No rash noted. Psychiatric: Mood and affect are normal. Patient exhibits appropriate insight and judgment. ____________________________________________   LABS (pertinent positives/negatives) Labs Reviewed  NOVEL CORONAVIRUS, NAA (HOSPITAL ORDER,  SEND-OUT TO REF LAB)  BASIC METABOLIC PANEL  CBC WITH DIFFERENTIAL/PLATELET  ____________________________________________   RADIOLOGY  CXR  IMPRESSION: 1. No acute cardiopulmonary process identified. 2. Low lung volumes. 3. Mild cardiomegaly.  I, Lissa Hoard, personally viewed and evaluated these images (plain radiographs) as part of my medical decision making, as well as reviewing the written report by the radiologist. ____________________________________________  PROCEDURES  Procedures ____________________________________________  INITIAL IMPRESSION / ASSESSMENT AND PLAN / ED COURSE  Brooke Aguirre was evaluated in Emergency Department on 11/18/2018 for the symptoms described in the history of present illness. She was evaluated in the context of the global COVID-19 pandemic, which necessitated consideration that the patient might be at risk for infection with the SARS-CoV-2 virus that causes COVID-19. Institutional protocols and algorithms that pertain to the evaluation of patients at risk for COVID-19 are in a state of rapid change based on information released by regulatory bodies including the CDC and federal and state organizations. These policies and algorithms were followed during the patient's care in the ED.  With ED evaluation of a 1 month complaint of intermittent  persistent cough.  She is denied any fevers, shortness of breath, chest pain, or nausea.  Her exam is overall reassuring.  Labs are within normal limits at this time.  Chest x-ray is also without any acute findings.  The finding of mild cardiomegaly probably due to poor positioning on a one-view portable x-ray.  Patient is discharged at this time to follow with primary provider or return to the ED as needed.  She will continue with previously prescribed allergy medicines including Flonase and over-the-counter Claritin.  She should use her albuterol inhaler as needed, and dose steroids as prescribed.  She  will follow with primary provider or return to the ED as discussed.  Her COVID test is pending at the time of discharge.  I have a low level of suspicion based on her presentation for COVID-19. ____________________________________________  FINAL CLINICAL IMPRESSION(S) / ED DIAGNOSES  Final diagnoses:  Cough      Abelardo Seidner, Charlesetta Ivory, PA-C 11/18/18 2046    Jeanmarie Plant, MD 11/19/18 1735

## 2018-11-18 NOTE — ED Triage Notes (Signed)
Pt has a cough for 1 month.  Treated for bronchitis recently.  No fever  cig smoker.  Pt alert.

## 2018-11-18 NOTE — Progress Notes (Signed)
Based on what you shared with me, I feel your condition warrants further evaluation and I recommend that you be seen for a face to face office visit.  Since you are having continued symptoms despite antibiotic treatment I recommend you be seen at [facility], an Urgent Care or your PCP office for further assessment.    NOTE: If you entered your credit card information for this eVisit, you will not be charged. You may see a "hold" on your card for the $35 but that hold will drop off and you will not have a charge processed.  If you are having a true medical emergency please call 911.  If you need an urgent face to face visit,  has four urgent care centers for your convenience.    PLEASE NOTE: THE [facility] LOCATIONS AND URGENT CARE CLINICS DO NOT HAVE THE TESTING FOR CORONAVIRUS COVID19 AVAILABLE.  IF YOU FEEL YOU NEED THIS TEST YOU MUST GO TO A TRIAGE LOCATION AT ONE OF THE HOSPITAL EMERGENCY DEPARTMENTS   [email or web address] to reserve your spot online an avoid wait times  [facility] [address], [address] [place], [place] [address] Modified hours of operation: Monday-Friday, 12 PM to 6 PM  Saturday & Sunday 10 AM to 4 PM *Across the street from [employer]  [employer] (New Address!) [address], [address] [place], [place] [address] *Just off [employer], across the road from [place]* Modified hours of operation: Monday-Friday, 12 PM to 6 PM  Closed Saturday & Sunday  [facility]'s modified hours of operation will be in effect from May 1 until May 31   The following sites will take your insurance:  . [facility]  [phone] Get Driving Directions Find a Provider at this Location  [address] [place], [place] [address] . 10 am to 8 pm Monday-Friday . 12 pm to 8 pm Saturday-Sunday   . [facility] at [facility]  [phone] Get Driving Directions Find a Provider at  this Location  [address], [address] [place], [place] [address] . 8 am to 8 pm Monday-Friday . 9 am to 6 pm Saturday . 11 am to 6 pm Sunday   . [facility] at [facility]  [phone] Get Driving Directions  [address] [place], [place] [address] . 8 am to 8 pm Monday-Friday . 8 am to 4 pm Saturday-Sunday   Your e-visit answers were reviewed by a board certified advanced clinical practitioner to complete your personal care plan.  Thank you for using e-Visits.

## 2018-11-20 LAB — NOVEL CORONAVIRUS, NAA (HOSP ORDER, SEND-OUT TO REF LAB; TAT 18-24 HRS): SARS-CoV-2, NAA: NOT DETECTED

## 2018-11-22 ENCOUNTER — Telehealth: Payer: Self-pay

## 2018-11-22 NOTE — Telephone Encounter (Signed)
Pt called no answer LM via voicemail to call the office to do prescreening before her visit to the office tomorrow.

## 2018-11-23 ENCOUNTER — Ambulatory Visit (INDEPENDENT_AMBULATORY_CARE_PROVIDER_SITE_OTHER): Payer: Medicaid Other | Admitting: Obstetrics and Gynecology

## 2018-11-23 ENCOUNTER — Encounter: Payer: Self-pay | Admitting: Obstetrics and Gynecology

## 2018-11-23 ENCOUNTER — Other Ambulatory Visit: Payer: Self-pay

## 2018-11-23 ENCOUNTER — Other Ambulatory Visit (HOSPITAL_COMMUNITY)
Admission: RE | Admit: 2018-11-23 | Discharge: 2018-11-23 | Disposition: A | Discharge status: Home / Self Care | Payer: Self-pay | Source: Ambulatory Visit | Attending: Obstetrics and Gynecology | Admitting: Obstetrics and Gynecology

## 2018-11-23 VITALS — BP 118/71 | HR 118 | Ht 59.0 in | Wt 210.3 lb

## 2018-11-23 DIAGNOSIS — Z124 Encounter for screening for malignant neoplasm of cervix: Secondary | ICD-10-CM | POA: Insufficient documentation

## 2018-11-23 DIAGNOSIS — N907 Vulvar cyst: Secondary | ICD-10-CM

## 2018-11-23 DIAGNOSIS — T8389XA Other specified complication of genitourinary prosthetic devices, implants and grafts, initial encounter: Secondary | ICD-10-CM

## 2018-11-23 DIAGNOSIS — N920 Excessive and frequent menstruation with regular cycle: Secondary | ICD-10-CM

## 2018-11-23 DIAGNOSIS — N871 Moderate cervical dysplasia: Secondary | ICD-10-CM

## 2018-11-23 NOTE — Progress Notes (Signed)
Patient comes in today for 6 month follow up for a PAP. She has a lump on the vagina area at the top. Patient would like to discuss her low sex drive. Would like to discuss taking her IUD out. She has Paraguard in. [facility] states that the beginning of her period she bleeds very heavy the first day and has bad cramps.

## 2018-11-23 NOTE — Addendum Note (Signed)
Addended by: Dorian Pod on: 11/23/2018 03:12 PM   Modules accepted: Orders

## 2018-11-23 NOTE — Progress Notes (Signed)
HPI:      [name] is a 23 y.o. G2P1002 who LMP was No LMP recorded. (Menstrual status: IUD).  Subjective:   She presents today with 4 issues. 1.  Here for follow-up of LEEP for CIN-2.  This is for her first Pap. 2.  Patient unhappy with her ParaGard IUD.  She states it has made her periods significantly heavier and longer.  She would like to use something else for birth control. 3.  She has noted a small bump on her labia that is mildly tender and she has concerns that it might be HPV. 4.  She says that her sex drive is decreased and she wondered if there was a medicine that she could take to fix it.    Hx: The following portions of the patient's history were reviewed and updated as appropriate:             She  has a past medical history of Abnormal Pap smear of cervix, Chronic kidney disease, Family history of adverse reaction to anesthesia, Heart murmur, HPV in female, Hypothyroidism, and Thyroid goiter. She does not have any pertinent problems on file. She  has a past surgical history that includes Removal of drug delivery implant (Left, 12/15/2014) and LEEP (N/A, 05/03/2018). Her family history is not on file. She  reports that she has been smoking cigarettes. She has a 5.00 pack-year smoking history. She has never used smokeless tobacco. She reports current alcohol use of about 9.0 standard drinks of alcohol per week. She reports that she does not use drugs. She has a current medication list which includes the following prescription(s): albuterol, melatonin-chamomile, copper, and tobramycin. She is allergic to kiwi extract; amoxicillin; penicillins; tramadol; cranberry; and sulfa antibiotics.       Review of Systems:  Review of Systems  Constitutional: Denied constitutional symptoms, night sweats, recent illness, fatigue, fever, insomnia and weight loss.  Eyes: Denied eye symptoms, eye pain, photophobia, vision change and visual disturbance.  Ears/Nose/Throat/Neck: Denied  ear, nose, throat or neck symptoms, hearing loss, nasal discharge, sinus congestion and sore throat.  Cardiovascular: Denied cardiovascular symptoms, arrhythmia, chest pain/pressure, edema, exercise intolerance, orthopnea and palpitations.  Respiratory: Denied pulmonary symptoms, asthma, pleuritic pain, productive sputum, cough, dyspnea and wheezing.  Gastrointestinal: Denied, gastro-esophageal reflux, melena, nausea and vomiting.  Genitourinary: See HPI for additional information.  Musculoskeletal: Denied musculoskeletal symptoms, stiffness, swelling, muscle weakness and myalgia.  Dermatologic: Denied dermatology symptoms, rash and scar.  Neurologic: Denied neurology symptoms, dizziness, headache, neck pain and syncope.  Psychiatric: Denied psychiatric symptoms, anxiety and depression.  Endocrine: Denied endocrine symptoms including hot flashes and night sweats.   Meds:   Current Outpatient Medications on File Prior to Visit  Medication Sig Dispense Refill  . albuterol (PROVENTIL HFA;VENTOLIN HFA) 108 (90 Base) MCG/ACT inhaler Inhale 2 puffs into the lungs every 4 (four) hours as needed for wheezing or shortness of breath. 1 Inhaler 0  . MELATONIN-CHAMOMILE PO Take 1 tablet by mouth at bedtime.    Marland Kitchen PARAGARD INTRAUTERINE COPPER IU 1 each by Intrauterine route once. Inserted 01/2016 - [facility]     . tobramycin (TOBREX) 0.3 % ophthalmic solution Place 2 drops into the left eye every 4 (four) hours. While awake 5 mL 0   No current facility-administered medications on file prior to visit.     Objective:     Vitals:   11/23/18 1335  BP: 118/71  Pulse: (!) 118  Physical examination   Pelvic:   Vulva: Normal appearance.  Small cystic structure noted at the apex of the left labia.  Vagina: No lesions or abnormalities noted.  Support: Normal pelvic support.  Urethra No masses tenderness or scarring.  Meatus Normal size without lesions or prolapse.  Cervix:  Cervix obviously status  post procedure.  No lesions.  IUD strings not noted*  Anus: Normal exam.  No lesions.  Perineum: Normal exam.  No lesions.        Bimanual   Uterus: Normal size.  Non-tender.  Mobile.  AV.  Adnexae: No masses.  Non-tender to palpation.  Cul-de-sac: Negative for abnormality.   *Patient says at the time of LEEP Dr. Greggory Keenefrancesco had to shorten the strings to perform the LEEP.  Assessment:    G2P1002 Patient Active Problem List   Diagnosis Date Noted  . Status post LEEP (loop electrosurgical excision procedure) of cervix 05/03/2018  . 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 Tobacco user 02/25/2018  . Obesity (BMI 30.0-34.9) 02/25/2018     1. Dysplasia of cervix, high grade CIN 2   2. Sebaceous cyst of labia   3. Menorrhagia due to intrauterine device (IUD) [facility](HCC)     Patient with positive margins on LEEP.  Previous ECC performed here for first follow-up Pap.  Sebaceous cyst of the labia  Patient unhappy with ParaGard IUD.   Plan:            1.  Pap performed when results return will consider follow-up as needed.  2.  Expectant management of sebaceous cyst  3.  Discussed multiple birth control methods including the risks and benefits of Mirena IUD versus ParaGard.  Patient has chosen to have ParaGard removed and Mirena inserted.  I believe this should stop her issues with heavy menstrual bleeding and continue to give her excellent birth control.  4.  Decreased sex drive discussed.  Circumstances affecting this including young children, date night, expectations- strategies discussed in detail. Orders No orders of the defined types were placed in this encounter.   No orders of the defined types were placed in this encounter.     F/U  No follow-ups on file. I spent 26 minutes involved in the care of this patient of which greater than 50% was spent discussing the above for issues as noted all questions answered.  Elonda Huskyavid J. Alexandre Lightsey, M.D. 11/23/2018 2:02 PM

## 2018-11-25 LAB — CYTOLOGY - PAP
Chlamydia: NEGATIVE
Diagnosis: NEGATIVE
Diagnosis: REACTIVE
Neisseria Gonorrhea: NEGATIVE

## 2018-12-07 ENCOUNTER — Encounter: Payer: Self-pay | Admitting: Obstetrics and Gynecology

## 2018-12-07 ENCOUNTER — Ambulatory Visit (INDEPENDENT_AMBULATORY_CARE_PROVIDER_SITE_OTHER): Payer: Medicaid Other | Admitting: Obstetrics and Gynecology

## 2018-12-07 ENCOUNTER — Other Ambulatory Visit: Payer: Self-pay

## 2018-12-07 VITALS — BP 93/59 | HR 80 | Ht 59.0 in | Wt 209.4 lb

## 2018-12-07 DIAGNOSIS — Z30433 Encounter for removal and reinsertion of intrauterine contraceptive device: Secondary | ICD-10-CM

## 2018-12-07 DIAGNOSIS — Z3009 Encounter for other general counseling and advice on contraception: Secondary | ICD-10-CM

## 2018-12-07 NOTE — Progress Notes (Signed)
HPI:      [name] is a 23 y.o. G2P1002 who LMP was No LMP recorded. (Menstrual status: IUD).  Subjective:   She presents today for IUD removal and reinsertion of new device.  Patient has a ParaGard in place and she has been having very heavy menstrual periods with significant cramping.  These are longer than her normal menses and there is much more pain.  She is interested in an IUD for birth control but would like to decreased her bleeding and discomfort.  She has elected to be used Mirena IUD.    Hx: The following portions of the patient's history were reviewed and updated as appropriate:             She  has a past medical history of Abnormal Pap smear of cervix, Chronic kidney disease, Family history of adverse reaction to anesthesia, Heart murmur, HPV in female, Hypothyroidism, and Thyroid goiter. She does not have any pertinent problems on file. She  has a past surgical history that includes Removal of drug delivery implant (Left, 12/15/2014) and LEEP (N/A, 05/03/2018). Her family history is not on file. She  reports that she has been smoking cigarettes. She has a 5.00 pack-year smoking history. She has never used smokeless tobacco. She reports current alcohol use of about 9.0 standard drinks of alcohol per week. She reports that she does not use drugs. She has a current medication list which includes the following prescription(s): albuterol, melatonin-chamomile, copper, and tobramycin. She is allergic to kiwi extract; amoxicillin; penicillins; tramadol; cranberry; and sulfa antibiotics.       Review of Systems:  Review of Systems  Constitutional: Denied constitutional symptoms, night sweats, recent illness, fatigue, fever, insomnia and weight loss.  Eyes: Denied eye symptoms, eye pain, photophobia, vision change and visual disturbance.  Ears/Nose/Throat/Neck: Denied ear, nose, throat or neck symptoms, hearing loss, nasal discharge, sinus congestion and sore throat.   Cardiovascular: Denied cardiovascular symptoms, arrhythmia, chest pain/pressure, edema, exercise intolerance, orthopnea and palpitations.  Respiratory: Denied pulmonary symptoms, asthma, pleuritic pain, productive sputum, cough, dyspnea and wheezing.  Gastrointestinal: Denied, gastro-esophageal reflux, melena, nausea and vomiting.  Genitourinary: Denied genitourinary symptoms including symptomatic vaginal discharge, pelvic relaxation issues, and urinary complaints.  Musculoskeletal: Denied musculoskeletal symptoms, stiffness, swelling, muscle weakness and myalgia.  Dermatologic: Denied dermatology symptoms, rash and scar.  Neurologic: Denied neurology symptoms, dizziness, headache, neck pain and syncope.  Psychiatric: Denied psychiatric symptoms, anxiety and depression.  Endocrine: Denied endocrine symptoms including hot flashes and night sweats.   Meds:   Current Outpatient Medications on File Prior to Visit  Medication Sig Dispense Refill  . albuterol (PROVENTIL HFA;VENTOLIN HFA) 108 (90 Base) MCG/ACT inhaler Inhale 2 puffs into the lungs every 4 (four) hours as needed for wheezing or shortness of breath. 1 Inhaler 0  . MELATONIN-CHAMOMILE PO Take 1 tablet by mouth at bedtime.    Marland Kitchen PARAGARD INTRAUTERINE COPPER IU 1 each by Intrauterine route once. Inserted 01/2016 - ws     . tobramycin (TOBREX) 0.3 % ophthalmic solution Place 2 drops into the left eye every 4 (four) hours. While awake 5 mL 0   No current facility-administered medications on file prior to visit.     Objective:     Vitals:   12/07/18 1056  BP: (!) 93/59  Pulse: 80    Physical examination   Pelvic:   Vulva: Normal appearance.  No lesions.  Vagina: No lesions or abnormalities noted.  Support: Normal pelvic support.  Urethra No  masses tenderness or scarring.  Meatus Normal size without lesions or prolapse.  Cervix: Normal appearance.  No lesions.  Anus: Normal exam.  No lesions.  Perineum: Normal exam.  No  lesions.        Bimanual   Uterus: Normal size.  Non-tender.  Mobile.  AV.  Adnexae: No masses.  Non-tender to palpation.  Cul-de-sac: Negative for abnormality.    IUD Removal Strings of IUD identified and grasped.  IUD removed without problem.  Pt tolerated this well.  IUD noted to be intact.  IUD Procedure Pt has read the booklet and signed the appropriate forms regarding the Mirena IUD.  All of her questions have been answered.   The cervix was cleansed with betadine solution.  After sounding the uterus and noting the position, the IUD was placed in the usual manner without problem.  The string was cut to the appropriate length.  The patient tolerated the procedure well.             Assessment:    G2P1002 Patient Active Problem List   Diagnosis Date Noted  . Status post LEEP (loop electrosurgical excision procedure) of cervix 05/03/2018  . 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 Tobacco user 02/25/2018  . Obesity (BMI 30.0-34.9) 02/25/2018     1. Birth control counseling     Patient desires IUD removal and reinsertion of Mirena.  Plan:             F/U  Return in about 4 weeks (around 01/04/2019) for For IUD f/u.  Elonda Huskyavid J. , M.D. 12/07/2018 11:26 AM

## 2018-12-08 ENCOUNTER — Telehealth: Payer: MEDICAID | Admitting: Family

## 2018-12-08 DIAGNOSIS — L729 Follicular cyst of the skin and subcutaneous tissue, unspecified: Secondary | ICD-10-CM

## 2018-12-08 NOTE — Progress Notes (Signed)
E Visit for Rash  We are sorry that you are not feeling well. Here is how we plan to help!  The area looks like a cyst to me. Try not to pick or squeeze at it as it looks slightly red and irritated. If the redness worsens you will need to be seen to see if it needs to be removed.    HOME CARE:   Take cool showers and avoid direct sunlight.  Apply cool compress or wet dressings.  Take a bath in an oatmeal bath.  Sprinkle content of one Aveeno packet under running faucet with comfortably warm water.  Bathe for 15-20 minutes, 1-2 times daily.  Pat dry with a towel. Do not rub the rash.  Use hydrocortisone cream.  Take an antihistamine like Benadryl for widespread rashes that itch.  The adult dose of Benadryl is 25-50 mg by mouth 4 times daily.  Caution:  This type of medication may cause sleepiness.  Do not drink alcohol, drive, or operate dangerous machinery while taking antihistamines.  Do not take these medications if you have prostate enlargement.  Read package instructions thoroughly on all medications that you take.  GET HELP RIGHT AWAY IF:   Symptoms don't go away after treatment.  Severe itching that persists.  If you rash spreads or swells.  If you rash begins to smell.  If it blisters and opens or develops a yellow-brown crust.  You develop a fever.  You have a sore throat.  You become short of breath.  MAKE SURE YOU:  Understand these instructions. Will watch your condition. Will get help right away if you are not doing well or get worse.  Thank you for choosing an e-visit. Your e-visit answers were reviewed by a board certified advanced clinical practitioner to complete your personal care plan. Depending upon the condition, your plan could have included both over the counter or prescription medications. Please review your pharmacy choice. Be sure that the pharmacy you have chosen is open so that you can pick up your prescription now.  If there is a problem  you may message your provider in MyChart to have the prescription routed to another pharmacy. Your safety is important to [place]. If you have drug allergies check your prescription carefully.  For the next 24 hours, you can use MyChart to ask questions about today's visit, request a non-urgent call back, or ask for a work or school excuse from your e-visit provider. You will get an email in the next two days asking about your experience. I hope that your e-visit has been valuable and will speed your recovery.

## 2018-12-17 ENCOUNTER — Telehealth: Payer: MEDICAID | Admitting: Nurse Practitioner

## 2018-12-17 DIAGNOSIS — B9789 Other viral agents as the cause of diseases classified elsewhere: Secondary | ICD-10-CM

## 2018-12-17 DIAGNOSIS — J069 Acute upper respiratory infection, unspecified: Secondary | ICD-10-CM

## 2018-12-17 MED ORDER — BENZONATATE 100 MG PO CAPS: 100.0000 mg | ORAL_CAPSULE | Freq: Three times a day (TID) | ORAL | 0 refills | Status: DC | PRN

## 2018-12-17 MED ORDER — FLUTICASONE PROPIONATE 50 MCG/ACT NA SUSP: 2.0000 | Freq: Every day | NASAL | 6 refills | Status: DC

## 2018-12-17 NOTE — Progress Notes (Signed)
We are sorry you are not feeling well.  Here is how we plan to help!  Based on what you have shared with me, it looks like you may have a viral upper respiratory infection.  Upper respiratory infections are caused by a large number of viruses; however, rhinovirus is the most common cause.   Symptoms vary from person to person, with common symptoms including sore throat, cough, and fatigue or lack of energy.  A low-grade fever of up to 100.4 may present, but is often uncommon.  Symptoms vary however, and are closely related to a person's age or underlying illnesses.  The most common symptoms associated with an upper respiratory infection are nasal discharge or congestion, cough, sneezing, headache and pressure in the ears and face.  These symptoms usually persist for about 3 to 10 days, but can last up to 2 weeks.  It is important to know that upper respiratory infections do not cause serious illness or complications in most cases.    Upper respiratory infections can be transmitted from person to person, with the most common method of transmission being a person's hands.  The virus is able to live on the skin and can infect other persons for up to 2 hours after direct contact.  Also, these can be transmitted when someone coughs or sneezes; thus, it is important to cover the mouth to reduce this risk.  To keep the spread of the illness at bay, good hand hygiene is very important.  This is an infection that is most likely caused by a virus. There are no specific treatments other than to help you with the symptoms until the infection runs its course.  We are sorry you are not feeling well.  Here is how we plan to help!   For nasal congestion, you may use an oral decongestants such as Mucinex D or if you have glaucoma or high blood pressure use plain Mucinex.  Saline nasal spray or nasal drops can help and can safely be used as often as needed for congestion.  For your congestion, I have prescribed Fluticasone  nasal spray one spray in each nostril twice a day  If you do not have a history of heart disease, hypertension, diabetes or thyroid disease, prostate/bladder issues or glaucoma, you may also use Sudafed to treat nasal congestion.  It is highly recommended that you consult with a pharmacist or your primary care physician to ensure this medication is safe for you to take.     If you have a cough, you may use cough suppressants such as Delsym and Robitussin.  If you have glaucoma or high blood pressure, you can also use Coricidin HBP.   For cough I have prescribed for you A prescription cough medication called Tessalon Perles 100 mg. You may take 1-2 capsules every 8 hours as needed for cough  If you have a sore or scratchy throat, use a saltwater gargle-  to  teaspoon of salt dissolved in a 4-ounce to 8-ounce glass of warm water.  Gargle the solution for approximately 15-30 seconds and then spit.  It is important not to swallow the solution.  You can also use throat lozenges/cough drops and Chloraseptic spray to help with throat pain or discomfort.  Warm or cold liquids can also be helpful in relieving throat pain.  For headache, pain or general discomfort, you can use Ibuprofen or Tylenol as directed.   Some authorities believe that zinc sprays or the use of Echinacea may shorten the   course of your symptoms.   HOME CARE . Only take medications as instructed by your medical team. . Be sure to drink plenty of fluids. Water is fine as well as fruit juices, sodas and electrolyte beverages. You may want to stay away from caffeine or alcohol. If you are nauseated, try taking small sips of liquids. How do you know if you are getting enough fluid? Your urine should be a pale yellow or almost colorless. . Get rest. . Taking a steamy shower or using a humidifier may help nasal congestion and ease sore throat pain. You can place a towel over your head and breathe in the steam from hot water coming from a  faucet. . Using a saline nasal spray works much the same way. . Cough drops, hard candies and sore throat lozenges may ease your cough. . Avoid close contacts especially the very young and the elderly . Cover your mouth if you cough or sneeze . Always remember to wash your hands.   GET HELP RIGHT AWAY IF: . You develop worsening fever. . If your symptoms do not improve within 10 days . You develop yellow or green discharge from your nose over 3 days. . You have coughing fits . You develop a severe head ache or visual changes. . You develop shortness of breath, difficulty breathing or start having chest pain . Your symptoms persist after you have completed your treatment plan  MAKE SURE YOU   Understand these instructions.  Will watch your condition.  Will get help right away if you are not doing well or get worse.  Your e-visit answers were reviewed by a board certified advanced clinical practitioner to complete your personal care plan. Depending upon the condition, your plan could have included both over the counter or prescription medications. Please review your pharmacy choice. If there is a problem, you may call our nursing hot line at and have the prescription routed to another pharmacy. Your safety is important to us. If you have drug allergies check your prescription carefully.   You can use MyChart to ask questions about today's visit, request a non-urgent call back, or ask for a work or school excuse for 24 hours related to this e-Visit. If it has been greater than 24 hours you will need to follow up with your provider, or enter a new e-Visit to address those concerns. You will get an e-mail in the next two days asking about your experience.  I hope that your e-visit has been valuable and will speed your recovery. Thank you for using e-visits.    5-10 minutes spent reviewing and documenting in chart.   

## 2018-12-18 ENCOUNTER — Telehealth: Payer: MEDICAID | Admitting: Nurse Practitioner

## 2018-12-18 DIAGNOSIS — J069 Acute upper respiratory infection, unspecified: Secondary | ICD-10-CM

## 2018-12-18 NOTE — Progress Notes (Signed)
E-Visit for Corona Virus Screening   Based on your current symptoms, it seems unlikely that your symptoms are related to the Coronavirus.    Coronavirus disease 2019 (COVID-19) is a respiratory illness that can spread from person to person. The virus that causes COVID-19 is a new virus that was first identified in the country of [place] but is now found in multiple other countries and has spread to the [place].  Symptoms associated with the virus are mild to severe fever, cough, and shortness of breath. There is currently no vaccine to protect against COVID-19, and there is no specific antiviral treatment for the virus.   To be considered HIGH RISK for Coronavirus (COVID-19), you have to meet the following criteria:  . Traveled to [place], [place], [place], [place] or [place]; or in the [place] to [place], [place], [place], or [place]; and have fever, cough, and shortness of breath within the last 2 weeks of travel OR  . Been in close contact with a person diagnosed with COVID-19 within the last 2 weeks and have fever, cough, and shortness of breath  . IF YOU DO NOT MEET THESE CRITERIA, YOU ARE CONSIDERED LOW RISK FOR COVID-19.   It is vitally important that if you feel that you have an infection such as this virus or any other virus that you stay home and away from places where you may spread it to others.  You should self-quarantine for 14 days if you have symptoms that could potentially be coronavirus and avoid contact with people age 165 and older.   You can use medication such as tessalon perles that were rx yesterday  You may also take acetaminophen (Tylenol) as needed for fever.   Reduce your risk of any infection by using the same precautions used for avoiding the common cold or flu:  Marland Kitchen. Wash your hands often with soap and warm water for at least 20 seconds.  If soap and water are not readily available, use an alcohol-based hand sanitizer with at least 60% alcohol.  . If  coughing or sneezing, cover your mouth and nose by coughing or sneezing into the elbow areas of your shirt or coat, into a tissue or into your sleeve (not your hands). . Avoid shaking hands with others and consider head nods or verbal greetings only. . Avoid touching your eyes, nose, or mouth with unwashed hands.  . Avoid close contact with people who are sick. . Avoid places or events with large numbers of people in one location, like concerts or sporting events. . Carefully consider travel plans you have or are making. . If you are planning any travel outside or inside the [place], visit the CDC's Travelers' Health webpage for the latest health notices. . If you have some symptoms but not all symptoms, continue to monitor at home and seek medical attention if your symptoms worsen. . If you are having a medical emergency, call 911.  HOME CARE . Only take medications as instructed by your medical team. . Drink plenty of fluids and get plenty of rest. . A steam or ultrasonic humidifier can help if you have congestion.   GET HELP RIGHT AWAY IF: . You develop worsening fever. . You become short of breath . You cough up blood. . Your symptoms become more severe MAKE SURE YOU   Understand these instructions.  Will watch your condition.  Will get help right away if you are not doing well or get worse.  Your  e-visit answers were reviewed by a board certified advanced clinical practitioner to complete your personal care plan.  Depending on the condition, your plan could have included both over the counter or prescription medications.  If there is a problem please reply once you have received a response from your provider. Your safety is important to [place].  If you have drug allergies check your prescription carefully.    You can use MyChart to ask questions about today's visit, request a non-urgent call back, or ask for a work or school excuse for 24 hours related to this e-Visit. If it has been  greater than 24 hours you will need to follow up with your provider, or enter a new e-Visit to address those concerns. You will get an e-mail in the next two days asking about your experience.  I hope that your e-visit has been valuable and will speed your recovery. Thank you for using e-visits.   5-10 minutes spent reviewing and documenting in chart.

## 2019-01-04 ENCOUNTER — Encounter: Payer: Self-pay | Admitting: Obstetrics and Gynecology

## 2019-01-04 ENCOUNTER — Other Ambulatory Visit: Payer: Self-pay

## 2019-01-04 ENCOUNTER — Ambulatory Visit (INDEPENDENT_AMBULATORY_CARE_PROVIDER_SITE_OTHER): Payer: Medicaid Other | Admitting: Obstetrics and Gynecology

## 2019-01-04 VITALS — BP 130/84 | HR 89 | Ht 59.0 in | Wt 206.7 lb

## 2019-01-04 DIAGNOSIS — Z30431 Encounter for routine checking of intrauterine contraceptive device: Secondary | ICD-10-CM | POA: Diagnosis not present

## 2019-01-04 NOTE — Progress Notes (Signed)
HPI:      [name] is a 23 y.o. G2P1002 who LMP was No LMP recorded. (Menstrual status: IUD).  Subjective:   She presents today 1 month after IUD placement.  She had a ParaGard removed and the Mirena placed because she was having significant dysmenorrhea and heavy bleeding on the ParaGard.  She states that she has had some spotting with the Mirena but is very happy with it.  She has resumed intercourse and has no issues with the strings.    Hx: The following portions of the patient's history were reviewed and updated as appropriate:             She  has a past medical history of Abnormal Pap smear of cervix, Chronic kidney disease, Family history of adverse reaction to anesthesia, Heart murmur, HPV in female, Hypothyroidism, and Thyroid goiter. She does not have any pertinent problems on file. She  has a past surgical history that includes Removal of drug delivery implant (Left, 12/15/2014) and LEEP (N/A, 05/03/2018). Her family history is not on file. She  reports that she has been smoking cigarettes. She has a 5.00 pack-year smoking history. She has never used smokeless tobacco. She reports current alcohol use of about 9.0 standard drinks of alcohol per week. She reports that she does not use drugs. She has a current medication list which includes the following prescription(s): albuterol, benzonatate, fluticasone, melatonin-chamomile, copper, and tobramycin. She is allergic to kiwi extract; amoxicillin; penicillins; tramadol; cranberry; and sulfa antibiotics.       Review of Systems:  Review of Systems  Constitutional: Denied constitutional symptoms, night sweats, recent illness, fatigue, fever, insomnia and weight loss.  Eyes: Denied eye symptoms, eye pain, photophobia, vision change and visual disturbance.  Ears/Nose/Throat/Neck: Denied ear, nose, throat or neck symptoms, hearing loss, nasal discharge, sinus congestion and sore throat.  Cardiovascular: Denied cardiovascular  symptoms, arrhythmia, chest pain/pressure, edema, exercise intolerance, orthopnea and palpitations.  Respiratory: Denied pulmonary symptoms, asthma, pleuritic pain, productive sputum, cough, dyspnea and wheezing.  Gastrointestinal: Denied, gastro-esophageal reflux, melena, nausea and vomiting.  Genitourinary: Denied genitourinary symptoms including symptomatic vaginal discharge, pelvic relaxation issues, and urinary complaints.  Musculoskeletal: Denied musculoskeletal symptoms, stiffness, swelling, muscle weakness and myalgia.  Dermatologic: Denied dermatology symptoms, rash and scar.  Neurologic: Denied neurology symptoms, dizziness, headache, neck pain and syncope.  Psychiatric: Denied psychiatric symptoms, anxiety and depression.  Endocrine: Denied endocrine symptoms including hot flashes and night sweats.   Meds:   Current Outpatient Medications on File Prior to Visit  Medication Sig Dispense Refill  . albuterol (PROVENTIL HFA;VENTOLIN HFA) 108 (90 Base) MCG/ACT inhaler Inhale 2 puffs into the lungs every 4 (four) hours as needed for wheezing or shortness of breath. 1 Inhaler 0  . benzonatate (TESSALON PERLES) 100 MG capsule Take 1 capsule (100 mg total) by mouth 3 (three) times daily as needed. 20 capsule 0  . fluticasone (FLONASE) 50 MCG/ACT nasal spray Place 2 sprays into both nostrils daily. 16 g 6  . MELATONIN-CHAMOMILE PO Take 1 tablet by mouth at bedtime.    Marland Kitchen PARAGARD INTRAUTERINE COPPER IU 1 each by Intrauterine route once. Inserted 01/2016 - ws     . tobramycin (TOBREX) 0.3 % ophthalmic solution Place 2 drops into the left eye every 4 (four) hours. While awake 5 mL 0   No current facility-administered medications on file prior to visit.     Objective:     Vitals:   01/04/19 1111  BP: 130/84  Pulse:  89              Physical examination   Pelvic:   Vulva: Normal appearance.  No lesions.  Vagina: No lesions or abnormalities noted.  Support: Normal pelvic support.   Urethra No masses tenderness or scarring.  Meatus Normal size without lesions or prolapse.  Cervix: Normal appearance.  No lesions. IUD strings noted at cervical os.  Anus: Normal exam.  No lesions.  Perineum: Normal exam.  No lesions.        Bimanual   Uterus: Normal size.  Non-tender.  Mobile.  AV.  Adnexae: No masses.  Non-tender to palpation.  Cul-de-sac: Negative for abnormality.     Assessment:    G2P1002 Patient Active Problem List   Diagnosis Date Noted  . Status post LEEP (loop electrosurgical excision procedure) of cervix 05/03/2018  . 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 Tobacco user 02/25/2018  . Obesity (BMI 30.0-34.9) 02/25/2018     1. Surveillance of previously prescribed intrauterine contraceptive device     Patient doing very well with Mirena IUD-it seems to be controlling her heavy vaginal bleeding and dysmenorrhea   Plan:            1.  Follow-up for annual examination in August. Orders No orders of the defined types were placed in this encounter.   No orders of the defined types were placed in this encounter.     F/U  Return in about 1 month (around 02/03/2019) for Annual Physical. I spent 16 minutes involved in the care of this patient of which greater than 50% was spent discussing IUD, breakthrough bleeding with IUD, HPV and necessity for follow-up Pap smears.  All questions answered.  Elonda Huskyavid J. Alexiana Laverdure, M.D. 01/04/2019 12:21 PM

## 2019-01-04 NOTE — Progress Notes (Signed)
Patient comes in today for IUD string check. No concerns today.

## 2019-01-16 ENCOUNTER — Other Ambulatory Visit: Payer: Self-pay

## 2019-01-16 ENCOUNTER — Encounter: Payer: Self-pay | Admitting: Emergency Medicine

## 2019-01-16 ENCOUNTER — Emergency Department
Admission: EM | Admit: 2019-01-16 | Discharge: 2019-01-16 | Disposition: A | Discharge status: Home / Self Care | Payer: Medicaid Other | Attending: Emergency Medicine | Admitting: Emergency Medicine

## 2019-01-16 ENCOUNTER — Telehealth: Payer: Medicaid Other | Admitting: Family

## 2019-01-16 DIAGNOSIS — H60332 Swimmer's ear, left ear: Secondary | ICD-10-CM | POA: Diagnosis not present

## 2019-01-16 DIAGNOSIS — H9209 Otalgia, unspecified ear: Secondary | ICD-10-CM | POA: Insufficient documentation

## 2019-01-16 DIAGNOSIS — Z5321 Procedure and treatment not carried out due to patient leaving prior to being seen by health care provider: Secondary | ICD-10-CM | POA: Diagnosis not present

## 2019-01-16 MED ORDER — NEOMYCIN-POLYMYXIN-HC 3.5-10000-1 OT SOLN: 4.0000 [drp] | Freq: Four times a day (QID) | OTIC | 0 refills | Status: DC

## 2019-01-16 NOTE — ED Triage Notes (Signed)
Patient states that she has had right ear pain times two days. Patient states that the pain has become worse after swimming yesterday. Patient states that she did an tele visit earlier today and prescribed ear drops and has used them once with no improvement.

## 2019-01-16 NOTE — Progress Notes (Signed)
E Visit for Swimmer's Ear  We are sorry that you are not feeling well. Here is how we plan to help!  I have prescribed: Neomycin 0.35%, polymyxin B 10,000 units/mL, and hydrocortisone 0,5% otic solution 4 drops in affected ears four times a day for 7 days   In certain cases swimmer's ear may progress to a more serious bacterial infection of the middle or inner ear.  If you have a fever 102 and up and significantly worsening symptoms, this could indicate a more serious infection moving to the middle/inner and needs face to face evaluation in an office by a provider.  Your symptoms should improve over the next 3 days and should resolve in about 7 days.  HOME CARE:   Wash your hands frequently.  Do not place the tip of the bottle on your ear or touch it with your fingers.  You can take Acetominophen 650 mg every 4-6 hours as needed for pain.  If pain is severe or moderate, you can apply a heating pad (set on low) or hot water bottle (wrapped in a towel) to outer ear for 20 minutes.  This will also increase drainage.  Avoid ear plugs  Do not use Q-tips  After showers, help the water run out by tilting your head to one side.  GET HELP RIGHT AWAY IF:   Fever is over 102.2 degrees.  You develop progressive ear pain or hearing loss.  Ear symptoms persist longer than 3 days after treatment.  MAKE SURE YOU:   Understand these instructions.  Will watch your condition.  Will get help right away if you are not doing well or get worse.  TO PREVENT SWIMMER'S EAR:  Use a bathing cap or custom fitted swim molds to keep your ears dry.  Towel off after swimming to dry your ears.  Tilt your head or pull your earlobes to allow the water to escape your ear canal.  If there is still water in your ears, consider using a hairdryer on the lowest setting.  Thank you for choosing an e-visit. Your e-visit answers were reviewed by a board certified advanced clinical practitioner to complete  your personal care plan. Depending upon the condition, your plan could have included both over the counter or prescription medications. Please review your pharmacy choice. Be sure that the pharmacy you have chosen is open so that you can pick up your prescription now.  If there is a problem you may message your provider in MyChart to have the prescription routed to another pharmacy. Your safety is important to us. If you have drug allergies check your prescription carefully.  For the next 24 hours, you can use MyChart to ask questions about today's visit, request a non-urgent call back, or ask for a work or school excuse from your e-visit provider. You will get an email in the next two days asking about your experience. I hope that your e-visit has been valuable and will speed your recovery.    Greater than 5 minutes, yet less than 10 minutes of time have been spent researching, coordinating, and implementing care for this patient today.  Thank you for the details you included in the comment boxes. Those details are very helpful in determining the best course of treatment for you and help us to provide the best care.  

## 2019-01-17 ENCOUNTER — Encounter: Payer: Self-pay | Admitting: Physician Assistant

## 2019-01-17 ENCOUNTER — Telehealth: Payer: MEDICAID | Admitting: Physician Assistant

## 2019-01-17 DIAGNOSIS — H60502 Unspecified acute noninfective otitis externa, left ear: Secondary | ICD-10-CM

## 2019-01-17 MED ORDER — AZITHROMYCIN 250 MG PO TABS: ORAL_TABLET | ORAL | 0 refills | Status: DC

## 2019-01-17 MED ORDER — CIPROFLOXACIN HCL 500 MG PO TABS: 500.0000 mg | ORAL_TABLET | Freq: Two times a day (BID) | ORAL | 0 refills | Status: DC

## 2019-01-17 NOTE — Progress Notes (Signed)
E Visit for Swimmer's Ear  We are sorry that you are not feeling well. Here is how we plan to help!    Cipro 500mg , one tablet my mouth twice a day for 10 days  In certain cases swimmer's ear may progress to a more serious bacterial infection of the middle or inner ear.  If you have a fever 102 and up and significantly worsening symptoms, this could indicate a more serious infection moving to the middle/inner and needs face to face evaluation in an office by a provider.  Your symptoms should improve over the next 3 days and should resolve in about 7 days.  HOME CARE:   Wash your hands frequently.  Do not place the tip of the bottle on your ear or touch it with your fingers.  You can take Acetominophen 650 mg every 4-6 hours as needed for pain.  If pain is severe or moderate, you can apply a heating pad (set on low) or hot water bottle (wrapped in a towel) to outer ear for 20 minutes.  This will also increase drainage.  Avoid ear plugs  Do not use Q-tips  After showers, help the water run out by tilting your head to one side.  GET HELP RIGHT AWAY IF:   Fever is over 102.2 degrees.  You develop progressive ear pain or hearing loss.  Ear symptoms persist longer than 3 days after treatment.  MAKE SURE YOU:   Understand these instructions.  Will watch your condition.  Will get help right away if you are not doing well or get worse.  TO PREVENT SWIMMER'S EAR:  Use a bathing cap or custom fitted swim molds to keep your ears dry.  Towel off after swimming to dry your ears.  Tilt your head or pull your earlobes to allow the water to escape your ear canal.  If there is still water in your ears, consider using a hairdryer on the lowest setting.  Thank you for choosing an e-visit. Your e-visit answers were reviewed by a board certified advanced clinical practitioner to complete your personal care plan. Depending upon the condition, your plan could have included both over  the counter or prescription medications. Please review your pharmacy choice. Be sure that the pharmacy you have chosen is open so that you can pick up your prescription now.  If there is a problem you may message your provider in [place] to have the prescription routed to another pharmacy. Your safety is important to [place]. If you have drug allergies check your prescription carefully.  For the next 24 hours, you can use MyChart to ask questions about today's visit, request a non-urgent call back, or ask for a work or school excuse from your e-visit provider. You will get an email in the next two days asking about your experience. I hope that your e-visit has been valuable and will speed your recovery.

## 2019-01-17 NOTE — Addendum Note (Signed)
Addended by: Waldon Merl on: 01/17/2019 01:42 PM   Modules accepted: Orders

## 2019-01-17 NOTE — Progress Notes (Signed)
Called pharmacy and cancelled the Ciprofloxacin Rx and added the allergy to the pt's list

## 2019-01-28 ENCOUNTER — Other Ambulatory Visit: Payer: Self-pay | Admitting: Otolaryngology

## 2019-01-28 DIAGNOSIS — H6122 Impacted cerumen, left ear: Secondary | ICD-10-CM | POA: Diagnosis not present

## 2019-01-28 DIAGNOSIS — H6010 Cellulitis of external ear, unspecified ear: Secondary | ICD-10-CM | POA: Diagnosis not present

## 2019-01-28 DIAGNOSIS — E041 Nontoxic single thyroid nodule: Secondary | ICD-10-CM | POA: Diagnosis not present

## 2019-02-04 ENCOUNTER — Other Ambulatory Visit: Payer: Self-pay

## 2019-02-04 ENCOUNTER — Ambulatory Visit
Admission: RE | Admit: 2019-02-04 | Discharge: 2019-02-04 | Disposition: A | Discharge status: Home / Self Care | Payer: Medicaid Other | Source: Ambulatory Visit | Attending: Otolaryngology | Admitting: Otolaryngology

## 2019-02-04 DIAGNOSIS — E041 Nontoxic single thyroid nodule: Secondary | ICD-10-CM | POA: Diagnosis not present

## 2019-02-04 DIAGNOSIS — Z0389 Encounter for observation for other suspected diseases and conditions ruled out: Secondary | ICD-10-CM | POA: Diagnosis not present

## 2019-02-09 DIAGNOSIS — E041 Nontoxic single thyroid nodule: Secondary | ICD-10-CM | POA: Diagnosis not present

## 2019-02-09 DIAGNOSIS — H6010 Cellulitis of external ear, unspecified ear: Secondary | ICD-10-CM | POA: Diagnosis not present

## 2019-02-09 DIAGNOSIS — H9190 Unspecified hearing loss, unspecified ear: Secondary | ICD-10-CM | POA: Diagnosis not present

## 2019-02-09 DIAGNOSIS — H93293 Other abnormal auditory perceptions, bilateral: Secondary | ICD-10-CM | POA: Diagnosis not present

## 2019-02-11 DIAGNOSIS — Z20828 Contact with and (suspected) exposure to other viral communicable diseases: Secondary | ICD-10-CM | POA: Diagnosis not present

## 2019-02-15 ENCOUNTER — Telehealth: Payer: Self-pay

## 2019-02-15 NOTE — Telephone Encounter (Signed)
Coronavirus (COVID-19) Are you at risk?  Are you at risk for the Coronavirus (COVID-19)?  To be considered HIGH RISK for Coronavirus (COVID-19), you have to meet the following criteria:  . Traveled to [place], [place], [place], [place] or [place]; or in the [place] to [place], [place], [place], or [place]; and have fever, cough, and shortness of breath within the last 2 weeks of travel OR . Been in close contact with a person diagnosed with COVID-19 within the last 2 weeks and have fever, cough, and shortness of breath . IF YOU DO NOT MEET THESE CRITERIA, YOU ARE CONSIDERED LOW RISK FOR COVID-19.  What to do if you are HIGH RISK for COVID-19?  . If you are having a medical emergency, call 911. . Seek medical care right away. Before you go to a doctor's office, urgent care or emergency department, call ahead and tell them about your recent travel, contact with someone diagnosed with COVID-19, and your symptoms. You should receive instructions from your physician's office regarding next steps of care.  . When you arrive at healthcare provider, tell the healthcare staff immediately you have returned from visiting [place], [place], [place], [place] or [place]; or traveled in the [place] to [place], [place], [place], or [place]; in the last two weeks or you have been in close contact with a person diagnosed with COVID-19 in the last 2 weeks.   . Tell the health care staff about your symptoms: fever, cough and shortness of breath. . After you have been seen by a medical provider, you will be either: o Tested for (COVID-19) and discharged home on quarantine except to seek medical care if symptoms worsen, and asked to  - Stay home and avoid contact with others until you get your results (4-5 days)  - Avoid travel on public transportation if possible (such as bus, train, or airplane) or o Sent to the Emergency Department by EMS for evaluation, COVID-19 testing, and possible  admission depending on your condition and test results.  What to do if you are LOW RISK for COVID-19?  Reduce your risk of any infection by using the same precautions used for avoiding the common cold or flu:  . Wash your hands often with soap and warm water for at least 20 seconds.  If soap and water are not readily available, use an alcohol-based hand sanitizer with at least 60% alcohol.  . If coughing or sneezing, cover your mouth and nose by coughing or sneezing into the elbow areas of your shirt or coat, into a tissue or into your sleeve (not your hands). . Avoid shaking hands with others and consider head nods or verbal greetings only. . Avoid touching your eyes, nose, or mouth with unwashed hands.  . Avoid close contact with people who are Mada Sadik. . Avoid places or events with large numbers of people in one location, like concerts or sporting events. . Carefully consider travel plans you have or are making. . If you are planning any travel outside or inside the [place], visit the CDC's Travelers' Health webpage for the latest health notices. . If you have some symptoms but not all symptoms, continue to monitor at home and seek medical attention if your symptoms worsen. . If you are having a medical emergency, call 911.  02/15/19 SCREENING NEG [facility] ADDITIONAL HEALTHCARE OPTIONS FOR PATIENTS   Telehealth / e-Visit: [email or web address]         [facility]: [phone]    [facility]: [phone]                   [facility]: [phone]  

## 2019-02-16 ENCOUNTER — Encounter: Payer: Medicaid Other | Admitting: Obstetrics and Gynecology

## 2019-02-24 DIAGNOSIS — E539 Vitamin B deficiency, unspecified: Secondary | ICD-10-CM | POA: Diagnosis not present

## 2019-02-24 DIAGNOSIS — R011 Cardiac murmur, unspecified: Secondary | ICD-10-CM | POA: Diagnosis not present

## 2019-02-24 DIAGNOSIS — Z0000 Encounter for general adult medical examination without abnormal findings: Secondary | ICD-10-CM | POA: Diagnosis not present

## 2019-02-24 DIAGNOSIS — E039 Hypothyroidism, unspecified: Secondary | ICD-10-CM | POA: Diagnosis not present

## 2019-02-24 DIAGNOSIS — Z1331 Encounter for screening for depression: Secondary | ICD-10-CM | POA: Diagnosis not present

## 2019-03-02 ENCOUNTER — Encounter: Payer: Self-pay | Admitting: Obstetrics and Gynecology

## 2019-03-02 ENCOUNTER — Encounter: Payer: Medicaid Other | Admitting: Obstetrics and Gynecology

## 2019-03-13 ENCOUNTER — Ambulatory Visit (INDEPENDENT_AMBULATORY_CARE_PROVIDER_SITE_OTHER)
Admission: RE | Admit: 2019-03-13 | Discharge: 2019-03-13 | Disposition: A | Discharge status: Home / Self Care | Payer: Medicaid Other | Source: Ambulatory Visit

## 2019-03-13 DIAGNOSIS — K1379 Other lesions of oral mucosa: Secondary | ICD-10-CM

## 2019-03-13 DIAGNOSIS — Z98818 Other dental procedure status: Secondary | ICD-10-CM | POA: Diagnosis not present

## 2019-03-13 MED ORDER — LIDOCAINE VISCOUS HCL 2 % MT SOLN: OROMUCOSAL | 0 refills | Status: DC

## 2019-03-13 NOTE — ED Provider Notes (Signed)
Virtual Visit via Video Note:  Brooke Aguirre  initiated request for Telemedicine visit with [facility] team. I connected with Brooke Aguirre  on 03/13/2019 at 1:32 PM  for a synchronized telemedicine visit using a video enabled HIPPA compliant telemedicine application. I verified that I am speaking with Brooke Aguirre  using two identifiers. Brooke Eagles, PA-C  was physically located in a [facility] Urgent care site and Brooke Aguirre was located at a different location.   The limitations of evaluation and management by telemedicine as well as the availability of in-person appointments were discussed. Patient was informed that she  may incur a bill ( including co-pay) for this virtual visit encounter. Brooke Aguirre  expressed understanding and gave verbal consent to proceed with virtual visit.   History of Present Illness:Brooke Aguirre  is a 23 y.o. female presents with 2 day history of persistent constant left sided jaw pain that is now progressing to the entire jaw. She now feels a "sulfur taste", bad smell in her mouth. Of note, patient had 4 wisdom teeth removed 5 days ago. She contacted her dental surgeon's office twice and was advised to take ibuprofen, hydrocodone. This does help with the pain for a few hours. She denies fever, warmth, drainage of pus or bleeding from her surgical sites. She has redness of her face generally but has not had more than normal.  She also has chlorhexidine oral rinse which she has been using twice daily. Unfortunately her dental surgeon will be unavailable for 2 weeks. She does have a regular dentist however that she can contact.    ROS   No current facility-administered medications for this encounter.    Current Outpatient Medications  Medication Sig Dispense Refill  . albuterol (PROVENTIL HFA;VENTOLIN HFA) 108 (90 Base) MCG/ACT inhaler Inhale 2 puffs into the lungs every 4 (four) hours as needed for wheezing or shortness of  breath. 1 Inhaler 0  . azithromycin (ZITHROMAX) 250 MG tablet Take 2 pills today, take one pill days 2-5 6 tablet 0  . benzonatate (TESSALON PERLES) 100 MG capsule Take 1 capsule (100 mg total) by mouth 3 (three) times daily as needed. 20 capsule 0  . fluticasone (FLONASE) 50 MCG/ACT nasal spray Place 2 sprays into both nostrils daily. 16 g 6  . MELATONIN-CHAMOMILE PO Take 1 tablet by mouth at bedtime.    Marland Kitchen neomycin-polymyxin-hydrocortisone (CORTISPORIN) OTIC solution Place 4 drops into the left ear 4 (four) times daily. 10 mL 0  . PARAGARD INTRAUTERINE COPPER IU 1 each by Intrauterine route once. Inserted 01/2016 - ws     . tobramycin (TOBREX) 0.3 % ophthalmic solution Place 2 drops into the left eye every 4 (four) hours. While awake 5 mL 0     Allergies  Allergen Reactions  . Kiwi Extract Anaphylaxis  . Amoxicillin Hives, Rash and Other (See Comments)    Blisters Has patient had a PCN reaction causing immediate rash, facial/tongue/throat swelling, SOB or lightheadedness with hypotension: No Has patient had a PCN reaction causing severe rash involving mucus membranes or skin necrosis: Yes Has patient had a PCN reaction that required hospitalization: No Has patient had a PCN reaction occurring within the last 10 years: Unknown If all of the above answers are "NO", then may proceed with Cephalosporin use.   Marland Kitchen Penicillins Rash and Other (See Comments)    Has patient had a PCN reaction causing immediate rash, facial/tongue/throat swelling, SOB or lightheadedness with hypotension:no Has  patient had a PCN reaction causing severe rash involving mucus membranes or skin necrosis: yes Has patient had a PCN reaction that required hospitalization no Has patient had a PCN reaction occurring within the last 10 years: yes If all of the above answers are "NO", then may proceed with Cephalosporin use.   . Tramadol Itching  . Ciprofloxacin   . Cranberry Rash  . Sulfa Antibiotics Rash and Other (See  Comments)    BLISTERS     Past Medical History:  Diagnosis Date  . Abnormal Pap smear of cervix   . Chronic kidney disease    CHRONIC  RIGHT PYELONEPHRITIS  . Family history of adverse reaction to anesthesia    PTS MOM WENT INTO COMA AFTER HYSTERECTOMY  . Heart murmur   . HPV in female   . Hypothyroidism   . Thyroid goiter     Past Surgical History:  Procedure Laterality Date  . LEEP N/A 05/03/2018   Procedure: LOOP ELECTROSURGICAL EXCISION PROCEDURE (LEEP);  Surgeon: Herold Harmsefrancesco, Martin A, MD;  Location: [facility] ORS;  Service: Gynecology;  Laterality: N/A;  . REMOVAL OF DRUG DELIVERY IMPLANT Left 12/15/2014   Procedure: REMOVAL OF DRUG DELIVERY IMPLANT;  Surgeon: Nadara Mustardobert P Harris, MD;  Location: [facility] ORS;  Service: Gynecology;  Laterality: Left;     Observations/Objective: Physical Exam Constitutional:      General: She is not in acute distress.    Appearance: Normal appearance. She is well-developed. She is not ill-appearing, toxic-appearing or diaphoretic.  HENT:     Head:     Comments: Visual inspection of mouth not feasible given patient had very poor lighting and video quality is not up to par. Eyes:     Extraocular Movements: Extraocular movements intact.  Pulmonary:     Effort: Pulmonary effort is normal.  Neurological:     General: No focal deficit present.     Mental Status: She is alert and oriented to person, place, and time.  Psychiatric:        Mood and Affect: Mood normal.        Behavior: Behavior normal.        Thought Content: Thought content normal.        Judgment: Judgment normal.    Assessment and Plan:  1. Status post wisdom tooth extraction   2. Oral pain    Recommended using viscous lidocaine. Maintain all other medications. Counseled against using antibiotics without exam or consult with her dental practice as she has significant and multiple allergies to antibiotics. Patient is agreeable to this. Will seek consult with her dentist. Counseled  patient on potential for adverse effects with medications prescribed/recommended today, ER and return-to-clinic precautions discussed, patient verbalized understanding.   Follow Up Instructions:    I discussed the assessment and treatment plan with the patient. The patient was provided an opportunity to ask questions and all were answered. The patient agreed with the plan and demonstrated an understanding of the instructions.   The patient was advised to call back or seek an in-person evaluation if the symptoms worsen or if the condition fails to improve as anticipated.  I provided 20 minutes of non-face-to-face time during this encounter.    Wallis BambergMario Terrance Usery, PA-C  03/13/2019 1:32 PM         Wallis BambergMani, Vian Fluegel, PA-C 03/13/19 1355

## 2019-03-22 ENCOUNTER — Other Ambulatory Visit: Payer: Self-pay

## 2019-03-22 ENCOUNTER — Ambulatory Visit (INDEPENDENT_AMBULATORY_CARE_PROVIDER_SITE_OTHER): Payer: Medicaid Other | Admitting: Obstetrics and Gynecology

## 2019-03-22 ENCOUNTER — Encounter: Payer: Self-pay | Admitting: Obstetrics and Gynecology

## 2019-03-22 ENCOUNTER — Other Ambulatory Visit (HOSPITAL_COMMUNITY)
Admission: RE | Admit: 2019-03-22 | Discharge: 2019-03-22 | Disposition: A | Discharge status: Home / Self Care | Payer: Medicaid Other | Source: Ambulatory Visit | Attending: Obstetrics and Gynecology | Admitting: Obstetrics and Gynecology

## 2019-03-22 VITALS — BP 129/93 | HR 83 | Ht 59.0 in | Wt 203.1 lb

## 2019-03-22 DIAGNOSIS — Z01419 Encounter for gynecological examination (general) (routine) without abnormal findings: Secondary | ICD-10-CM | POA: Diagnosis not present

## 2019-03-22 DIAGNOSIS — Z124 Encounter for screening for malignant neoplasm of cervix: Secondary | ICD-10-CM

## 2019-03-22 DIAGNOSIS — Z0000 Encounter for general adult medical examination without abnormal findings: Secondary | ICD-10-CM

## 2019-03-22 DIAGNOSIS — Z9889 Other specified postprocedural states: Secondary | ICD-10-CM

## 2019-03-22 NOTE — Addendum Note (Signed)
Addended by: Durwin Glaze on: 03/22/2019 03:22 PM   Modules accepted: Orders

## 2019-03-22 NOTE — Progress Notes (Signed)
HPI:      [name] is a 23 y.o. G2P1002 who LMP was No LMP recorded. (Menstrual status: IUD).  Subjective:   She presents today for her annual examination.  She likes her new IUD.  She states that she has occasional spotting but otherwise no issues. She has multiple questions about her previous HPV and LEEP procedure.  We have discussed this in some detail.    Hx: The following portions of the patient's history were reviewed and updated as appropriate:             She  has a past medical history of Abnormal Pap smear of cervix, Chronic kidney disease, Family history of adverse reaction to anesthesia, Heart murmur, HPV in female, Hypothyroidism, and Thyroid goiter. She does not have any pertinent problems on file. She  has a past surgical history that includes Removal of drug delivery implant (Left, 12/15/2014) and LEEP (N/A, 05/03/2018). Her family history is not on file. She  reports that she has been smoking cigarettes. She has a 5.00 pack-year smoking history. She has never used smokeless tobacco. She reports current alcohol use. She reports that she does not use drugs. She has a current medication list which includes the following prescription(s): albuterol, fluticasone, lidocaine, melatonin-chamomile, and tobramycin. She is allergic to kiwi extract; amoxicillin; penicillins; tramadol; ciprofloxacin; cranberry; and sulfa antibiotics.       Review of Systems:  Review of Systems  Constitutional: Denied constitutional symptoms, night sweats, recent illness, fatigue, fever, insomnia and weight loss.  Eyes: Denied eye symptoms, eye pain, photophobia, vision change and visual disturbance.  Ears/Nose/Throat/Neck: Denied ear, nose, throat or neck symptoms, hearing loss, nasal discharge, sinus congestion and sore throat.  Cardiovascular: Denied cardiovascular symptoms, arrhythmia, chest pain/pressure, edema, exercise intolerance, orthopnea and palpitations.  Respiratory: Denied  pulmonary symptoms, asthma, pleuritic pain, productive sputum, cough, dyspnea and wheezing.  Gastrointestinal: Denied, gastro-esophageal reflux, melena, nausea and vomiting.  Genitourinary:  Occasional spotting with IUD  Musculoskeletal: Denied musculoskeletal symptoms, stiffness, swelling, muscle weakness and myalgia.  Dermatologic: Denied dermatology symptoms, rash and scar.  Neurologic: Denied neurology symptoms, dizziness, headache, neck pain and syncope.  Psychiatric: Denied psychiatric symptoms, anxiety and depression.  Endocrine: Denied endocrine symptoms including hot flashes and night sweats.   Meds:   Current Outpatient Medications on File Prior to Visit  Medication Sig Dispense Refill  . albuterol (PROVENTIL HFA;VENTOLIN HFA) 108 (90 Base) MCG/ACT inhaler Inhale 2 puffs into the lungs every 4 (four) hours as needed for wheezing or shortness of breath. 1 Inhaler 0  . fluticasone (FLONASE) 50 MCG/ACT nasal spray Place 2 sprays into both nostrils daily. 16 g 6  . lidocaine (XYLOCAINE) 2 % solution Use 26mL oral rinse 3 times daily as needed. 200 mL 0  . MELATONIN-CHAMOMILE PO Take 1 tablet by mouth at bedtime.    Marland Kitchen tobramycin (TOBREX) 0.3 % ophthalmic solution Place 2 drops into the left eye every 4 (four) hours. While awake 5 mL 0   No current facility-administered medications on file prior to visit.     Objective:     Vitals:   03/22/19 1417  BP: (!) 129/93  Pulse: 83              Physical examination General NAD, Conversant  HEENT Atraumatic; Op clear with mmm.  Normo-cephalic. Pupils reactive. Anicteric sclerae  Thyroid/Neck Smooth without nodularity or enlargement. Normal ROM.  Neck Supple.  Skin No rashes, lesions or ulceration. Normal palpated skin turgor. No nodularity.  Breasts: No masses or discharge.  Symmetric.  No axillary adenopathy.  Lungs: Clear to auscultation.No rales or wheezes. Normal Respiratory effort, no retractions.  Heart: NSR.  No murmurs or  rubs appreciated. No periferal edema  Abdomen: Soft.  Non-tender.  No masses.  No HSM. No hernia  Extremities: Moves all appropriately.  Normal ROM for age. No lymphadenopathy.  Neuro: Oriented to PPT.  Normal mood. Normal affect.     Pelvic:   Vulva: Normal appearance.  No lesions.  Vagina: No lesions or abnormalities noted.  Support: Normal pelvic support.  Urethra No masses tenderness or scarring.  Meatus Normal size without lesions or prolapse.  Cervix: Normal appearance.  No lesions.  IUD strings noted at cervical os  Anus: Normal exam.  No lesions.  Perineum: Normal exam.  No lesions.        Bimanual   Uterus: Normal size.  Non-tender.  Mobile.  AV.  Adnexae: No masses.  Non-tender to palpation.  Cul-de-sac: Negative for abnormality.      Assessment:    G2P1002 Patient Active Problem List   Diagnosis Date Noted  . Status post LEEP (loop electrosurgical excision procedure) of cervix 05/03/2018  . 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 Tobacco user 02/25/2018  . Obesity (BMI 30.0-34.9) 02/25/2018     1. Well woman exam with routine gynecological exam   2. History of loop electrical excision procedure (LEEP)     Patient doing well with IUD   History of LEEP   Plan:            1.  Basic Screening Recommendations The basic screening recommendations for asymptomatic women were discussed with the patient during her visit.  The age-appropriate recommendations were discussed with her and the rational for the tests reviewed.  When I am informed by the patient that another primary care physician has previously obtained the age-appropriate tests and they are up-to-date, only outstanding tests are ordered and referrals given as necessary.  Abnormal results of tests will be discussed with her when all of her results are completed. Pap performed-recommend annual Pap smears for a few years. Orders No orders of the defined types were placed in this encounter.   No  orders of the defined types were placed in this encounter.       F/U  Return in about 1 year (around 03/21/2020) for Annual Physical.  Elonda Huskyavid J. Hien Perreira, M.D. 03/22/2019 2:57 PM

## 2019-03-24 LAB — CYTOLOGY - PAP
Chlamydia: NEGATIVE
Diagnosis: NEGATIVE
Neisseria Gonorrhea: NEGATIVE

## 2019-03-25 ENCOUNTER — Telehealth: Payer: Self-pay | Admitting: Obstetrics and Gynecology

## 2019-03-25 NOTE — Telephone Encounter (Signed)
The patient called and stated that she needs to speak with her nurse to go over her test results. Pt stated that her results are in MyChart but she does not understand the medical terminology. Please advise.

## 2019-03-28 NOTE — Telephone Encounter (Signed)
Notified patient of negative results.

## 2019-03-29 ENCOUNTER — Inpatient Hospital Stay
Admission: EM | Admit: 2019-03-29 | Discharge: 2019-03-31 | DRG: 694 | Disposition: A | Discharge status: Home / Self Care | Payer: Medicaid Other | Attending: Internal Medicine | Admitting: Internal Medicine

## 2019-03-29 ENCOUNTER — Other Ambulatory Visit: Payer: Self-pay

## 2019-03-29 ENCOUNTER — Emergency Department: Payer: Medicaid Other

## 2019-03-29 DIAGNOSIS — Z6841 Body Mass Index (BMI) 40.0 and over, adult: Secondary | ICD-10-CM

## 2019-03-29 DIAGNOSIS — F17210 Nicotine dependence, cigarettes, uncomplicated: Secondary | ICD-10-CM | POA: Diagnosis present

## 2019-03-29 DIAGNOSIS — Z882 Allergy status to sulfonamides status: Secondary | ICD-10-CM

## 2019-03-29 DIAGNOSIS — E669 Obesity, unspecified: Secondary | ICD-10-CM | POA: Diagnosis present

## 2019-03-29 DIAGNOSIS — N132 Hydronephrosis with renal and ureteral calculous obstruction: Secondary | ICD-10-CM | POA: Diagnosis not present

## 2019-03-29 DIAGNOSIS — Z23 Encounter for immunization: Secondary | ICD-10-CM

## 2019-03-29 DIAGNOSIS — M5489 Other dorsalgia: Secondary | ICD-10-CM | POA: Diagnosis not present

## 2019-03-29 DIAGNOSIS — Z03818 Encounter for observation for suspected exposure to other biological agents ruled out: Secondary | ICD-10-CM | POA: Diagnosis not present

## 2019-03-29 DIAGNOSIS — Z20828 Contact with and (suspected) exposure to other viral communicable diseases: Secondary | ICD-10-CM | POA: Diagnosis present

## 2019-03-29 DIAGNOSIS — D72829 Elevated white blood cell count, unspecified: Secondary | ICD-10-CM | POA: Diagnosis not present

## 2019-03-29 DIAGNOSIS — N179 Acute kidney failure, unspecified: Secondary | ICD-10-CM | POA: Diagnosis present

## 2019-03-29 DIAGNOSIS — M545 Low back pain: Secondary | ICD-10-CM | POA: Diagnosis not present

## 2019-03-29 DIAGNOSIS — R52 Pain, unspecified: Secondary | ICD-10-CM | POA: Diagnosis not present

## 2019-03-29 DIAGNOSIS — Z881 Allergy status to other antibiotic agents status: Secondary | ICD-10-CM

## 2019-03-29 DIAGNOSIS — N1330 Unspecified hydronephrosis: Secondary | ICD-10-CM | POA: Diagnosis not present

## 2019-03-29 DIAGNOSIS — Z7951 Long term (current) use of inhaled steroids: Secondary | ICD-10-CM

## 2019-03-29 DIAGNOSIS — Z880 Allergy status to penicillin: Secondary | ICD-10-CM

## 2019-03-29 DIAGNOSIS — I10 Essential (primary) hypertension: Secondary | ICD-10-CM | POA: Diagnosis not present

## 2019-03-29 DIAGNOSIS — Z975 Presence of (intrauterine) contraceptive device: Secondary | ICD-10-CM

## 2019-03-29 DIAGNOSIS — R112 Nausea with vomiting, unspecified: Secondary | ICD-10-CM | POA: Diagnosis not present

## 2019-03-29 DIAGNOSIS — N200 Calculus of kidney: Secondary | ICD-10-CM

## 2019-03-29 DIAGNOSIS — Z8639 Personal history of other endocrine, nutritional and metabolic disease: Secondary | ICD-10-CM

## 2019-03-29 LAB — COMPREHENSIVE METABOLIC PANEL
ALT: 113 U/L — ABNORMAL HIGH (ref 0–44)
AST: 87 U/L — ABNORMAL HIGH (ref 15–41)
Albumin: 4.5 g/dL (ref 3.5–5.0)
Alkaline Phosphatase: 78 U/L (ref 38–126)
Anion gap: 9 (ref 5–15)
BUN: 13 mg/dL (ref 6–20)
CO2: 22 mmol/L (ref 22–32)
Calcium: 9.5 mg/dL (ref 8.9–10.3)
Chloride: 105 mmol/L (ref 98–111)
Creatinine, Ser: 0.76 mg/dL (ref 0.44–1.00)
GFR calc Af Amer: >60 mL/min (ref >60)
GFR calc non Af Amer: >60 mL/min (ref >60)
Glucose, Bld: 101 mg/dL — ABNORMAL HIGH (ref 70–99)
Potassium: 4.1 mmol/L (ref 3.5–5.1)
Sodium: 136 mmol/L (ref 135–145)
Total Bilirubin: 1.1 mg/dL (ref 0.3–1.2)
Total Protein: 7.9 g/dL (ref 6.5–8.1)

## 2019-03-29 LAB — CBC WITH DIFFERENTIAL/PLATELET
Abs Immature Granulocytes: 0.05 10*3/uL (ref 0.00–0.07)
Basophils Absolute: 0.1 10*3/uL (ref 0.0–0.1)
Basophils Relative: 1 %
Eosinophils Absolute: 0.1 10*3/uL (ref 0.0–0.5)
Eosinophils Relative: 1 %
HCT: 45.7 % (ref 36.0–46.0)
Hemoglobin: 16.2 g/dL — ABNORMAL HIGH (ref 12.0–15.0)
Immature Granulocytes: 0 %
Lymphocytes Relative: 22 %
Lymphs Abs: 2.8 10*3/uL (ref 0.7–4.0)
MCH: 33.3 pg (ref 26.0–34.0)
MCHC: 35.4 g/dL (ref 30.0–36.0)
MCV: 94 fL (ref 80.0–100.0)
Monocytes Absolute: 0.7 10*3/uL (ref 0.1–1.0)
Monocytes Relative: 6 %
Neutro Abs: 9.1 10*3/uL — ABNORMAL HIGH (ref 1.7–7.7)
Neutrophils Relative %: 70 %
Platelets: 271 10*3/uL (ref 150–400)
RBC: 4.86 MIL/uL (ref 3.87–5.11)
RDW: 11.9 % (ref 11.5–15.5)
WBC: 12.8 10*3/uL — ABNORMAL HIGH (ref 4.0–10.5)
nRBC: 0 % (ref 0.0–0.2)

## 2019-03-29 LAB — URINALYSIS, ROUTINE W REFLEX MICROSCOPIC
Bacteria, UA: NONE SEEN
Bilirubin Urine: NEGATIVE
Glucose, UA: NEGATIVE mg/dL
Hgb urine dipstick: NEGATIVE
Ketones, ur: NEGATIVE mg/dL
Leukocytes,Ua: NEGATIVE
Nitrite: NEGATIVE
Protein, ur: 30 mg/dL — AB
Specific Gravity, Urine: 1.025 (ref 1.005–1.030)
Squamous Epithelial / HPF: NONE SEEN (ref 0–5)
WBC, UA: NONE SEEN WBC/hpf (ref 0–5)
pH: 5 (ref 5.0–8.0)

## 2019-03-29 LAB — LIPASE, BLOOD: Lipase: 22 U/L (ref 11–51)

## 2019-03-29 LAB — POCT PREGNANCY, URINE: Preg Test, Ur: NEGATIVE

## 2019-03-29 LAB — SARS CORONAVIRUS 2 BY RT PCR (HOSPITAL ORDER, PERFORMED IN ~~LOC~~ HOSPITAL LAB): SARS Coronavirus 2: NEGATIVE

## 2019-03-29 MED ORDER — KETOROLAC TROMETHAMINE 30 MG/ML IJ SOLN
30.0000 mg | Freq: Once | INTRAMUSCULAR | Status: AC
  Administered 2019-03-29: 30 mg via INTRAVENOUS
  Filled 2019-03-29: qty 1

## 2019-03-29 MED ORDER — HYDROMORPHONE HCL 1 MG/ML IJ SOLN
1.0000 mg | INTRAMUSCULAR | Status: DC | PRN
  Administered 2019-03-29 – 2019-03-30 (×2): 1 mg via INTRAVENOUS
  Filled 2019-03-29 (×2): qty 1

## 2019-03-29 MED ORDER — ACETAMINOPHEN 325 MG PO TABS: 650.0000 mg | ORAL_TABLET | Freq: Four times a day (QID) | ORAL | Status: DC | PRN

## 2019-03-29 MED ORDER — ONDANSETRON HCL 4 MG PO TABS: 4.0000 mg | ORAL_TABLET | Freq: Four times a day (QID) | ORAL | Status: DC | PRN

## 2019-03-29 MED ORDER — SODIUM CHLORIDE 0.9 % IV BOLUS
1000.0000 mL | Freq: Once | INTRAVENOUS | Status: AC
  Administered 2019-03-29: 1000 mL via INTRAVENOUS

## 2019-03-29 MED ORDER — FENTANYL CITRATE (PF) 100 MCG/2ML IJ SOLN
50.0000 ug | Freq: Once | INTRAMUSCULAR | Status: AC
  Administered 2019-03-29: 50 ug via INTRAVENOUS
  Filled 2019-03-29: qty 2

## 2019-03-29 MED ORDER — PROMETHAZINE HCL 25 MG/ML IJ SOLN
25.0000 mg | Freq: Four times a day (QID) | INTRAMUSCULAR | Status: DC | PRN
  Administered 2019-03-29 – 2019-03-30 (×3): 25 mg via INTRAVENOUS
  Filled 2019-03-29 (×3): qty 1

## 2019-03-29 MED ORDER — SODIUM CHLORIDE 0.9 % IV SOLN
INTRAVENOUS | Status: DC
  Administered 2019-03-29 – 2019-03-31 (×6): via INTRAVENOUS

## 2019-03-29 MED ORDER — FENTANYL CITRATE (PF) 100 MCG/2ML IJ SOLN
100.0000 ug | Freq: Once | INTRAMUSCULAR | Status: AC
  Administered 2019-03-29: 16:00:00 100 ug via INTRAVENOUS
  Filled 2019-03-29: qty 2

## 2019-03-29 MED ORDER — FLUTICASONE PROPIONATE 50 MCG/ACT NA SUSP
2.0000 | Freq: Every day | NASAL | Status: DC
  Administered 2019-03-31: 2 via NASAL
  Filled 2019-03-29: qty 16

## 2019-03-29 MED ORDER — ACETAMINOPHEN 650 MG RE SUPP: 650.0000 mg | Freq: Four times a day (QID) | RECTAL | Status: DC | PRN

## 2019-03-29 MED ORDER — ALBUTEROL SULFATE (2.5 MG/3ML) 0.083% IN NEBU: 2.5000 mg | INHALATION_SOLUTION | RESPIRATORY_TRACT | Status: DC | PRN

## 2019-03-29 MED ORDER — DOCUSATE SODIUM 100 MG PO CAPS
100.0000 mg | ORAL_CAPSULE | Freq: Two times a day (BID) | ORAL | Status: DC
  Administered 2019-03-30 – 2019-03-31 (×2): 100 mg via ORAL
  Filled 2019-03-29 (×3): qty 1

## 2019-03-29 MED ORDER — INFLUENZA VAC SPLIT QUAD 0.5 ML IM SUSY
0.5000 mL | PREFILLED_SYRINGE | INTRAMUSCULAR | Status: AC
  Administered 2019-03-31: 0.5 mL via INTRAMUSCULAR
  Filled 2019-03-29 (×2): qty 0.5

## 2019-03-29 MED ORDER — ONDANSETRON HCL 4 MG/2ML IJ SOLN
4.0000 mg | Freq: Once | INTRAMUSCULAR | Status: AC
  Administered 2019-03-29: 4 mg via INTRAVENOUS
  Filled 2019-03-29: qty 2

## 2019-03-29 MED ORDER — TRAZODONE HCL 50 MG PO TABS
25.0000 mg | ORAL_TABLET | Freq: Every evening | ORAL | Status: DC | PRN
  Administered 2019-03-29 – 2019-03-30 (×2): 25 mg via ORAL
  Filled 2019-03-29 (×2): qty 1

## 2019-03-29 MED ORDER — HYDROMORPHONE HCL 1 MG/ML IJ SOLN
1.0000 mg | Freq: Once | INTRAMUSCULAR | Status: AC
  Administered 2019-03-29: 1 mg via INTRAVENOUS
  Filled 2019-03-29: qty 1

## 2019-03-29 MED ORDER — BISACODYL 5 MG PO TBEC: 5.0000 mg | DELAYED_RELEASE_TABLET | Freq: Every day | ORAL | Status: DC | PRN

## 2019-03-29 MED ORDER — KETOROLAC TROMETHAMINE 30 MG/ML IJ SOLN
30.0000 mg | Freq: Four times a day (QID) | INTRAMUSCULAR | Status: DC | PRN
  Administered 2019-03-29 – 2019-03-30 (×2): 30 mg via INTRAVENOUS
  Filled 2019-03-29 (×3): qty 1

## 2019-03-29 MED ORDER — HYDROCODONE-ACETAMINOPHEN 5-325 MG PO TABS
1.0000 | ORAL_TABLET | ORAL | Status: DC | PRN
  Administered 2019-03-30 – 2019-03-31 (×4): 2 via ORAL
  Filled 2019-03-29 (×4): qty 2

## 2019-03-29 MED ORDER — OXYCODONE-ACETAMINOPHEN 5-325 MG PO TABS
1.0000 | ORAL_TABLET | Freq: Once | ORAL | Status: AC
  Administered 2019-03-29: 1 via ORAL
  Filled 2019-03-29: qty 1

## 2019-03-29 MED ORDER — ONDANSETRON HCL 4 MG/2ML IJ SOLN
4.0000 mg | Freq: Four times a day (QID) | INTRAMUSCULAR | Status: DC | PRN
  Administered 2019-03-31 (×2): 4 mg via INTRAVENOUS
  Filled 2019-03-29 (×2): qty 2

## 2019-03-29 NOTE — ED Provider Notes (Signed)
[facility] Emergency Department Provider Note  ____________________________________________   First MD Initiated Contact with Patient 03/29/19 1540     (approximate)  I have reviewed the triage vital signs and the nursing notes.   HISTORY  Chief Complaint Abdominal Pain and Urinary Retention    HPI Brooke Aguirre is a 23 y.o. female with abnormal Pap smear status post LEEP procedure in October 2019 now with IUD in place who presents with abdominal pain.  Patient endorses this morning having some difficulty with getting her urine out and having a little bit of dysuria.  She then developed severe left flank tenderness that started a few hours later that is constant, nothing makes it better, nothing makes it worse.  Denies any fevers, vomiting, history of kidney stones.  No lower abdominal pain.          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 Authorizing Provider  albuterol (PROVENTIL HFA;VENTOLIN HFA) 108 (90 Base) MCG/ACT inhaler Inhale 2 puffs into the lungs every 4 (four) hours as needed for wheezing or shortness of breath. 09/01/18   Cuthriell, Delorise RoyalsJonathan D, PA-C  fluticasone (FLONASE) 50 MCG/ACT nasal spray Place 2 sprays into both nostrils daily. 12/17/18   Daphine DeutscherMartin, Kameshia Madruga-Margaret, FNP  lidocaine (XYLOCAINE) 2 % solution Use 15mL oral rinse 3 times daily as needed. 03/13/19   Wallis BambergMani, Mario, PA-C  MELATONIN-CHAMOMILE PO Take 1 tablet by mouth at bedtime.    [provider]  tobramycin (TOBREX) 0.3 % ophthalmic solution Place 2 drops into the left eye every 4 (four) hours. While awake 07/09/18   Tommi RumpsSummers, Rhonda L, PA-C    Allergies Kiwi extract, Amoxicillin, Penicillins, Tramadol, Ciprofloxacin, Cranberry, and Sulfa antibiotics  Family History  Problem Relation Age of Onset   Breast cancer Neg Hx    Ovarian cancer Neg Hx    Colon cancer Neg Hx     Social History Social History   Tobacco Use   Smoking status: Current Every Day Smoker    Packs/day: 1.00    Years: 5.00    Pack years: 5.00    Types: Cigarettes   Smokeless tobacco: Never Used  Substance Use Topics   Alcohol use: Yes   Drug use: No      Review of Systems Constitutional: No fever/chills Eyes: No visual changes. ENT: No sore throat. Cardiovascular: Denies chest pain. Respiratory: Denies shortness  of breath. Gastrointestinal: No abdominal pain.  No nausea, no vomiting.  No diarrhea.  No constipation. Genitourinary: Positive for dysuria, inability to completely void, left flank tenderness Musculoskeletal: Negative for back pain. Skin: Negative for rash. Neurological: Negative for headaches, focal weakness or numbness. All other ROS negative ____________________________________________   PHYSICAL EXAM:  VITAL SIGNS: Blood pressure (!) 138/92, pulse 86, resp. rate 17, height 4\' 11"  (1.499 m), weight 93 kg, SpO2 96  %.  Constitutional: Alert and oriented. Well appearing and in no acute distress. Eyes: Conjunctivae are normal. EOMI. Head: Atraumatic. Nose: No congestion/rhinnorhea. Mouth/Throat: Mucous membranes are moist.   Neck: No stridor. Trachea Midline. FROM Cardiovascular: Normal rate, regular rhythm. Grossly normal heart sounds.  Good peripheral circulation. Respiratory: Normal respiratory effort.  No retractions. Lungs CTAB. Gastrointestinal: Soft and nontender. No distention. No abdominal bruits.  Tenderness to palpation on the left flank Musculoskeletal: No lower extremity tenderness nor edema.  No joint effusions. Neurologic:  Normal speech and language. No gross focal neurologic deficits are appreciated.  Skin:  Skin is warm, dry and intact. No rash noted. Psychiatric: Mood and affect are normal. Speech and behavior are normal. GU: Deferred   ____________________________________________   LABS (all labs ordered are listed, but only abnormal results are displayed)  Labs Reviewed  CBC WITH DIFFERENTIAL/PLATELET - Abnormal; Notable for the following components:      Result Value   WBC 12.8 (*)    Hemoglobin 16.2 (*)    Neutro Abs 9.1 (*)    All other components within normal limits  COMPREHENSIVE METABOLIC PANEL - Abnormal; Notable for the following components:   Glucose, Bld 101 (*)    AST 87 (*)    ALT 113 (*)    All other components within normal limits  URINALYSIS, ROUTINE W REFLEX MICROSCOPIC - Abnormal; Notable for the following components:   Color, Urine YELLOW (*)    APPearance TURBID (*)    Protein, ur 30 (*)    All other components within normal limits  SARS CORONAVIRUS 2 (HOSPITAL ORDER, PERFORMED IN [facility])  LIPASE, BLOOD  POC URINE PREG, ED  POCT PREGNANCY, URINE   ____________________________________________   ED ECG REPORT I, Concha Se, the attending physician, personally viewed and interpreted this ECG.  EKG is normal sinus rate of  84, no ST elevation, T wave inversion in lead III and aVF and V3, normal intervals ____________________________________________  RADIOLOGY   Official radiology report(s): Ct Renal Stone Study  Result Date: 03/29/2019 CLINICAL DATA:  Neg preg test today. Triage note: Pt comes EMS from home after having sharp lower left abdominal pain and not being able to urinate since 9am. EMS reports a mass like area on back side near left kidney that is tender with palpation. EXAM: CT ABDOMEN AND PELVIS WITHOUT CONTRAST TECHNIQUE: Multidetector CT imaging of the abdomen and pelvis was performed following the standard protocol without IV contrast. COMPARISON:  CT abdomen pelvis 12/17/2015 FINDINGS: Lower chest: No acute abnormality. Hepatobiliary: No focal liver abnormality is seen. No gallstones, gallbladder wall thickening, or biliary dilatation. Pancreas: Unremarkable Spleen: Normal in size without focal abnormality. Adrenals/Urinary Tract: Adrenal glands are unremarkable. Asymmetric enlargement of the left kidney. There are several left renal calculi, largest in the inferior pole measuring 3 mm. There is mild left hydronephrosis and ureterectasis with a 3 mm calculus at the left UVJ likely causing the mild upstream obstruction. There are a couple of punctate renal calculi in the right kidney. No hydronephrosis. Bladder  is unremarkable. Stomach/Bowel: Stomach is within normal limits. Appendix appears normal. No evidence of bowel wall thickening, distention, or inflammatory changes. Vascular/Lymphatic: No significant vascular findings are present. No enlarged abdominal or pelvic lymph nodes. Reproductive: Uterus and bilateral adnexa are unremarkable. IUD in place. Other: No abdominal wall hernia or abnormality. No abdominopelvic ascites. Musculoskeletal: No acute or significant osseous findings. IMPRESSION: 1. Mild to moderate left hydronephrosis and ureterectasis likely secondary to a 3 mm stone in the region of the  left UVJ. 2. Nonobstructive right renal calculi. Electronically Signed   By: Audie Pinto M.D.   On: 03/29/2019 16:36    ____________________________________________   PROCEDURES  Procedure(s) performed (including Critical Care):  Procedures   ____________________________________________   INITIAL IMPRESSION / ASSESSMENT AND PLAN / ED COURSE  ANDRA HESLIN was evaluated in Emergency Department on 03/29/2019 for the symptoms described in the history of present illness. She was evaluated in the context of the global COVID-19 pandemic, which necessitated consideration that the patient might be at risk for infection with the SARS-CoV-2 virus that causes COVID-19. Institutional protocols and algorithms that pertain to the evaluation of patients at risk for COVID-19 are in a state of rapid change based on information released by regulatory bodies including the CDC and federal and state organizations. These policies and algorithms were followed during the patient's care in the ED.    Patient is a 23 year old female who presents with severe pain in the left flank and some urinary symptoms.  Will get urine to evaluate for ectopic pregnancy although lower suspicion given IUD in place.  No lower abdominal pain to suggest ovarian torsion vs IUD misplacement.  Patient is currently married and no new sexual contacts to suggest PID.  No right lower quadrant pain to suggest appendicitis.  This is most concerning for pyelonephritis versus kidney stone.  Given the severity of pain will get CT scan to further evaluate.   CT scan is consistent with 3 mm stone with evidence of mild to moderate hydro-.  5:56 PM patient vomited 4 times.  Given this we will discuss with the medicine team for admission for vomiting and pain control.  Discussed with the hospital team and they will admit patient.  ___________________________________   FINAL CLINICAL IMPRESSION(S) / ED DIAGNOSES   Final diagnoses:   Kidney stone  Nausea and vomiting, intractability of vomiting not specified, unspecified vomiting type      MEDICATIONS GIVEN DURING THIS VISIT:  Medications  HYDROmorphone (DILAUDID) injection 1 mg (has no administration in time range)  ondansetron (ZOFRAN) injection 4 mg (has no administration in time range)  sodium chloride 0.9 % bolus 1,000 mL (1,000 mLs Intravenous Bolus 03/29/19 1600)  fentaNYL (SUBLIMAZE) injection 50 mcg (50 mcg Intravenous Given 03/29/19 1557)  ondansetron (ZOFRAN) injection 4 mg (4 mg Intravenous Given 03/29/19 1557)  fentaNYL (SUBLIMAZE) injection 100 mcg (100 mcg Intravenous Given 03/29/19 1629)  ketorolac (TORADOL) 30 MG/ML injection 30 mg (30 mg Intravenous Given 03/29/19 1706)  oxyCODONE-acetaminophen (PERCOCET/ROXICET) 5-325 MG per tablet 1 tablet (1 tablet Oral Given 03/29/19 1706)     ED Discharge Orders    None       Note:  This document was prepared using Dragon voice recognition software and may include unintentional dictation errors.   Vanessa Harcourt, MD 03/29/19 [phone]

## 2019-03-29 NOTE — ED Notes (Signed)
Bladder scan showed no significant amount of urine in bladder.

## 2019-03-29 NOTE — ED Notes (Signed)
Pt given ice chips and some gingerale, declined any crackers. Pt reports she is having more nausea.  Informed her that it was too early for nausea medication as she just had some around 6pm.

## 2019-03-29 NOTE — ED Notes (Signed)
Patient transported to CT 

## 2019-03-29 NOTE — ED Notes (Addendum)
Pt tolerated gingerale without vomiting, but still reports nausea.  Pt informed that she will not be due for any more IV pain medication or nausea medication for several hours. Pt verbalized understanding.   Attempted to draw HIV test when IV started, however, blood would not pull enough for tubes to fill. Informed Jeannette on the [place].

## 2019-03-29 NOTE — ED Triage Notes (Signed)
Pt comes EMS from home after having sharp lower left abdominal pain and not being able to urinate since 9am. EMS reports a mass like area on back side near left kidney that is tender with palpation.

## 2019-03-29 NOTE — H&P (Signed)
[facility] [place] [facility]   PATIENT NAME: Brooke Aguirre    MR#:  [id number]  DATE OF BIRTH:  Dec 26, 1995  DATE OF ADMISSION:  03/29/2019  PRIMARY CARE PHYSICIAN: Evelene CroonNiemeyer, Meindert, MD   REQUESTING/REFERRING PHYSICIAN: Concha SeFunke, Mary E, MD  CHIEF COMPLAINT:   Chief Complaint  Patient presents with  . Abdominal Pain  . Urinary Retention    HISTORY OF PRESENT ILLNESS:  Brooke Aguirre  is a 23 y.o. female with a known history of hypothyroidism, abnormal Pap smear status post LEEP procedure in October 2019 now with IUD in place who presents with abdominal pain.  Patient endorses this morning having some difficulty with getting her urine out and having a little bit of dysuria.  She then developed severe left flank tenderness that started a few hours later that is constant, nothing makes it better, nothing makes it worse.  In the ED she continued to have uncontrolled pain nausea and vomiting for which she is being admitted for further evaluation management PAST MEDICAL HISTORY:   Past Medical History:  Diagnosis Date  . Abnormal Pap smear of cervix   . Chronic kidney disease    CHRONIC  RIGHT PYELONEPHRITIS  . Family history of adverse reaction to anesthesia    PTS MOM WENT INTO COMA AFTER HYSTERECTOMY  . Heart murmur   . HPV in female   . Hypothyroidism   . Thyroid goiter     PAST SURGICAL HISTORY:   Past Surgical History:  Procedure Laterality Date  . LEEP N/A 05/03/2018   Procedure: LOOP ELECTROSURGICAL EXCISION PROCEDURE (LEEP);  Surgeon: Herold Harmsefrancesco, Martin A, MD;  Location: [facility] ORS;  Service: Gynecology;  Laterality: N/A;  . REMOVAL OF DRUG DELIVERY IMPLANT Left 12/15/2014   Procedure: REMOVAL OF DRUG DELIVERY IMPLANT;  Surgeon: Nadara Mustardobert P Harris, MD;  Location: [facility] ORS;  Service: Gynecology;  Laterality: Left;    SOCIAL HISTORY:   Social History   Tobacco Use  . Smoking status: Current Every Day Smoker    Packs/day: 1.00    Years: 5.00     Pack years: 5.00    Types: Cigarettes  . Smokeless tobacco: Never Used  Substance Use Topics  . Alcohol use: Yes    FAMILY HISTORY:   Family History  Problem Relation Age of Onset  . Breast cancer Neg Hx   . Ovarian cancer Neg Hx   . Colon cancer Neg Hx     DRUG ALLERGIES:   Allergies  Allergen Reactions  . Kiwi Extract Anaphylaxis  . Amoxicillin Hives, Rash and Other (See Comments)    Blisters Has patient had a PCN reaction causing immediate rash, facial/tongue/throat swelling, SOB or lightheadedness with hypotension: No Has patient had a PCN reaction causing severe rash involving mucus membranes or skin necrosis: Yes Has patient had a PCN reaction that required hospitalization: No Has patient had a PCN reaction occurring within the last 10 years: Unknown If all of the above answers are "NO", then may proceed with Cephalosporin use.   Marland Kitchen. Penicillins Rash and Other (See Comments)    Has patient had a PCN reaction causing immediate rash, facial/tongue/throat swelling, SOB or lightheadedness with hypotension:no Has patient had a PCN reaction causing severe rash involving mucus membranes or skin necrosis: yes Has patient had a PCN reaction that required hospitalization no Has patient had a PCN reaction occurring within the last 10 years: yes If all of the above answers are "NO", then may proceed with Cephalosporin use.   .Marland Kitchen  Tramadol Itching  . Ciprofloxacin   . Cranberry Rash  . Sulfa Antibiotics Rash and Other (See Comments)    BLISTERS    REVIEW OF SYSTEMS:   Review of Systems  Constitutional: Negative for diaphoresis, fever, malaise/fatigue and weight loss.  HENT: Negative for ear discharge, ear pain, hearing loss, nosebleeds, sore throat and tinnitus.   Eyes: Negative for blurred vision and pain.  Respiratory: Negative for cough, hemoptysis, shortness of breath and wheezing.   Cardiovascular: Negative for chest pain, palpitations, orthopnea and leg swelling.   Gastrointestinal: Positive for nausea and vomiting. Negative for abdominal pain, blood in stool, constipation, diarrhea and heartburn.  Genitourinary: Positive for dysuria and flank pain. Negative for frequency and urgency.  Musculoskeletal: Negative for back pain and myalgias.  Skin: Negative for itching and rash.  Neurological: Negative for dizziness, tingling, tremors, focal weakness, seizures, weakness and headaches.  Psychiatric/Behavioral: Negative for depression. The patient is not nervous/anxious.     MEDICATIONS AT HOME:   Prior to Admission medications   Medication Sig Start Date End Date Taking? Authorizing Provider  albuterol (PROVENTIL HFA;VENTOLIN HFA) 108 (90 Base) MCG/ACT inhaler Inhale 2 puffs into the lungs every 4 (four) hours as needed for wheezing or shortness of breath. 09/01/18   Cuthriell, Delorise Royals, PA-C  fluticasone (FLONASE) 50 MCG/ACT nasal spray Place 2 sprays into both nostrils daily. 12/17/18   Daphine Deutscher, Mary-Margaret, FNP  lidocaine (XYLOCAINE) 2 % solution Use 48mL oral rinse 3 times daily as needed. 03/13/19   Wallis Bamberg, PA-C  MELATONIN-CHAMOMILE PO Take 1 tablet by mouth at bedtime.    [provider]  tobramycin (TOBREX) 0.3 % ophthalmic solution Place 2 drops into the left eye every 4 (four) hours. While awake 07/09/18   Bridget Hartshorn L, PA-C      VITAL SIGNS:  Blood pressure (!) 138/92, pulse 86, temperature 98.5 F (36.9 C), temperature source Oral, resp. rate 17, height 4\' 11"  (1.499 m), weight 93 kg, SpO2 96 %.  PHYSICAL EXAMINATION:  Physical Exam  GENERAL:  23 y.o.-year-old patient lying in the bed with no acute distress.  EYES: Pupils equal, round, reactive to light and accommodation. No scleral icterus. Extraocular muscles intact.  HEENT: Head atraumatic, normocephalic. Oropharynx and nasopharynx clear.  NECK:  Supple, no jugular venous distention. No thyroid enlargement, no tenderness.  LUNGS: Normal breath sounds bilaterally, no  wheezing, rales,rhonchi or crepitation. No use of accessory muscles of respiration.  CARDIOVASCULAR: S1, S2 normal. No murmurs, rubs, or gallops.  ABDOMEN: Soft, nontender, nondistended. Bowel sounds present. No organomegaly or mass.  EXTREMITIES: No pedal edema, cyanosis, or clubbing.  NEUROLOGIC: Cranial nerves II through XII are intact. Muscle strength 5/5 in all extremities. Sensation intact. Gait not checked.  PSYCHIATRIC: The patient is alert and oriented x 3.  SKIN: No obvious rash, lesion, or ulcer.   LABORATORY PANEL:   CBC Recent Labs  Lab 03/29/19 1551  WBC 12.8*  HGB 16.2*  HCT 45.7  PLT 271   ------------------------------------------------------------------------------------------------------------------  Chemistries  Recent Labs  Lab 03/29/19 1551  NA 136  K 4.1  CL 105  CO2 22  GLUCOSE 101*  BUN 13  CREATININE 0.76  CALCIUM 9.5  AST 87*  ALT 113*  ALKPHOS 78  BILITOT 1.1   ------------------------------------------------------------------------------------------------------------------  Cardiac Enzymes No results for input(s): TROPONINI in the last 168 hours. ------------------------------------------------------------------------------------------------------------------  RADIOLOGY:  Ct Renal Stone Study  Result Date: 03/29/2019 CLINICAL DATA:  Neg preg test today. Triage note: Pt comes EMS from home  after having sharp lower left abdominal pain and not being able to urinate since 9am. EMS reports a mass like area on back side near left kidney that is tender with palpation. EXAM: CT ABDOMEN AND PELVIS WITHOUT CONTRAST TECHNIQUE: Multidetector CT imaging of the abdomen and pelvis was performed following the standard protocol without IV contrast. COMPARISON:  CT abdomen pelvis 12/17/2015 FINDINGS: Lower chest: No acute abnormality. Hepatobiliary: No focal liver abnormality is seen. No gallstones, gallbladder wall thickening, or biliary dilatation.  Pancreas: Unremarkable Spleen: Normal in size without focal abnormality. Adrenals/Urinary Tract: Adrenal glands are unremarkable. Asymmetric enlargement of the left kidney. There are several left renal calculi, largest in the inferior pole measuring 3 mm. There is mild left hydronephrosis and ureterectasis with a 3 mm calculus at the left UVJ likely causing the mild upstream obstruction. There are a couple of punctate renal calculi in the right kidney. No hydronephrosis. Bladder is unremarkable. Stomach/Bowel: Stomach is within normal limits. Appendix appears normal. No evidence of bowel wall thickening, distention, or inflammatory changes. Vascular/Lymphatic: No significant vascular findings are present. No enlarged abdominal or pelvic lymph nodes. Reproductive: Uterus and bilateral adnexa are unremarkable. IUD in place. Other: No abdominal wall hernia or abnormality. No abdominopelvic ascites. Musculoskeletal: No acute or significant osseous findings. IMPRESSION: 1. Mild to moderate left hydronephrosis and ureterectasis likely secondary to a 3 mm stone in the region of the left UVJ. 2. Nonobstructive right renal calculi. Electronically Signed   By: Audie Pinto M.D.   On: 03/29/2019 16:36   IMPRESSION AND PLAN:  23 year old female being admitted for kidney stone related pain nausea and vomiting  *Nephrolithiasis -Uncontrolled pain nausea and vomiting likely due to same -Dramatic management for now -Stone should pass on its own -Outpatient urology follow-up  * Mild to moderate left hydronephrosis and ureterectasis - likely secondary to a 3 mm stone in the region of the left UVJ.  * Nonobstructive right renal calculi. -Symptomatic management -Outpatient urology follow-up  *Leukocytosis -Likely from stress    All the records are reviewed and case discussed with ED provider. Management plans discussed with the patient, Nursing and they are in agreement.  CODE STATUS: FULL CODE  TOTAL  TIME TAKING CARE OF THIS PATIENT: 45 minutes.    Max Sane M.D on 03/29/2019 at 6:05 PM  Between 7am to 6pm - Pager - [phone]  After 6pm go to [email or web address] - Proofreader  [facility]  Office  [phone]  CC: Primary care physician; Lorelee Market, MD   Note: This dictation was prepared with Dragon dictation along with smaller phrase technology. Any transcriptional errors that result from this process are unintentional.

## 2019-03-29 NOTE — ED Notes (Signed)
Pt vomitted x4. MD notified.

## 2019-03-30 DIAGNOSIS — N179 Acute kidney failure, unspecified: Secondary | ICD-10-CM | POA: Diagnosis not present

## 2019-03-30 DIAGNOSIS — Z23 Encounter for immunization: Secondary | ICD-10-CM | POA: Diagnosis not present

## 2019-03-30 DIAGNOSIS — N200 Calculus of kidney: Secondary | ICD-10-CM

## 2019-03-30 DIAGNOSIS — Z881 Allergy status to other antibiotic agents status: Secondary | ICD-10-CM | POA: Diagnosis not present

## 2019-03-30 DIAGNOSIS — Z880 Allergy status to penicillin: Secondary | ICD-10-CM | POA: Diagnosis not present

## 2019-03-30 DIAGNOSIS — N1330 Unspecified hydronephrosis: Secondary | ICD-10-CM | POA: Diagnosis not present

## 2019-03-30 DIAGNOSIS — R112 Nausea with vomiting, unspecified: Secondary | ICD-10-CM | POA: Diagnosis not present

## 2019-03-30 DIAGNOSIS — Z975 Presence of (intrauterine) contraceptive device: Secondary | ICD-10-CM | POA: Diagnosis not present

## 2019-03-30 DIAGNOSIS — N132 Hydronephrosis with renal and ureteral calculous obstruction: Secondary | ICD-10-CM | POA: Diagnosis not present

## 2019-03-30 DIAGNOSIS — Z03818 Encounter for observation for suspected exposure to other biological agents ruled out: Secondary | ICD-10-CM | POA: Diagnosis not present

## 2019-03-30 DIAGNOSIS — Z8639 Personal history of other endocrine, nutritional and metabolic disease: Secondary | ICD-10-CM | POA: Diagnosis not present

## 2019-03-30 DIAGNOSIS — D72829 Elevated white blood cell count, unspecified: Secondary | ICD-10-CM | POA: Diagnosis not present

## 2019-03-30 DIAGNOSIS — Z7951 Long term (current) use of inhaled steroids: Secondary | ICD-10-CM | POA: Diagnosis not present

## 2019-03-30 DIAGNOSIS — Z6841 Body Mass Index (BMI) 40.0 and over, adult: Secondary | ICD-10-CM | POA: Diagnosis not present

## 2019-03-30 DIAGNOSIS — Z882 Allergy status to sulfonamides status: Secondary | ICD-10-CM | POA: Diagnosis not present

## 2019-03-30 DIAGNOSIS — E669 Obesity, unspecified: Secondary | ICD-10-CM | POA: Diagnosis not present

## 2019-03-30 DIAGNOSIS — Z20828 Contact with and (suspected) exposure to other viral communicable diseases: Secondary | ICD-10-CM | POA: Diagnosis not present

## 2019-03-30 DIAGNOSIS — F17210 Nicotine dependence, cigarettes, uncomplicated: Secondary | ICD-10-CM | POA: Diagnosis not present

## 2019-03-30 LAB — BASIC METABOLIC PANEL
Anion gap: 5 (ref 5–15)
BUN: 12 mg/dL (ref 6–20)
CO2: 23 mmol/L (ref 22–32)
Calcium: 8.6 mg/dL — ABNORMAL LOW (ref 8.9–10.3)
Chloride: 111 mmol/L (ref 98–111)
Creatinine, Ser: 1.02 mg/dL — ABNORMAL HIGH (ref 0.44–1.00)
GFR calc Af Amer: >60 mL/min (ref >60)
GFR calc non Af Amer: >60 mL/min (ref >60)
Glucose, Bld: 100 mg/dL — ABNORMAL HIGH (ref 70–99)
Potassium: 3.7 mmol/L (ref 3.5–5.1)
Sodium: 139 mmol/L (ref 135–145)

## 2019-03-30 LAB — CBC
HCT: 40.1 % (ref 36.0–46.0)
Hemoglobin: 14 g/dL (ref 12.0–15.0)
MCH: 33.4 pg (ref 26.0–34.0)
MCHC: 34.9 g/dL (ref 30.0–36.0)
MCV: 95.7 fL (ref 80.0–100.0)
Platelets: 203 10*3/uL (ref 150–400)
RBC: 4.19 MIL/uL (ref 3.87–5.11)
RDW: 11.8 % (ref 11.5–15.5)
WBC: 9.8 10*3/uL (ref 4.0–10.5)
nRBC: 0 % (ref 0.0–0.2)

## 2019-03-30 LAB — GLUCOSE, CAPILLARY: Glucose-Capillary: 127 mg/dL — ABNORMAL HIGH (ref 70–99)

## 2019-03-30 MED ORDER — ENOXAPARIN SODIUM 40 MG/0.4ML ~~LOC~~ SOLN
40.0000 mg | Freq: Two times a day (BID) | SUBCUTANEOUS | Status: DC
  Administered 2019-03-30 – 2019-03-31 (×3): 40 mg via SUBCUTANEOUS
  Filled 2019-03-30 (×3): qty 0.4

## 2019-03-30 MED ORDER — TAMSULOSIN HCL 0.4 MG PO CAPS
0.4000 mg | ORAL_CAPSULE | Freq: Every day | ORAL | Status: DC
  Administered 2019-03-30 – 2019-03-31 (×2): 0.4 mg via ORAL
  Filled 2019-03-30 (×2): qty 1

## 2019-03-30 MED ORDER — HYDROMORPHONE HCL 1 MG/ML IJ SOLN
1.0000 mg | INTRAMUSCULAR | Status: DC | PRN
  Administered 2019-03-30 – 2019-03-31 (×2): 1 mg via INTRAVENOUS
  Filled 2019-03-30 (×2): qty 1

## 2019-03-30 NOTE — Progress Notes (Signed)
Anticoagulation monitoring(Lovenox):  23yo  female ordered Lovenox 40 mg Q24h  Filed Weights   03/29/19 1543 03/29/19 2106 03/30/19 0500  Weight: 205 lb (93 kg) 203 lb 4.2 oz (92.2 kg) 203 lb (92.1 kg)   BMI 41   Lab Results  Component Value Date   CREATININE 1.02 (H) 03/30/2019   CREATININE 0.76 03/29/2019   CREATININE 0.76 11/18/2018   Estimated Creatinine Clearance: 85 mL/min (A) (by C-G formula based on SCr of 1.02 mg/dL (H)). Hemoglobin & Hematocrit     Component Value Date/Time   HGB 14.0 03/30/2019 0449   HGB 10.9 (L) 01/19/2013 2119   HCT 40.1 03/30/2019 0449   HCT 29.3 (L) 01/21/2013 0555     Per Protocol for Patient with estCrcl > 30 ml/min and BMI > 40, will transition to Lovenox 40 mg Q12h.

## 2019-03-30 NOTE — Progress Notes (Signed)
[place] [place] [facility] [place] NAME: Brooke Aguirre    MR#:  [id number]  DATE OF BIRTH:  1996/06/08  SUBJECTIVE:  CHIEF COMPLAINT:   Chief Complaint  Patient presents with  . Abdominal Pain  . Urinary Retention   Patient is a 23y/o female with PMH of tobacco use disorder and obesity who presented to the ED with pain on urination, left sided low back and flank pain that radiated to her groin with associated nausea and vomiting. She was found to have a left UVJ 66mm kidney stone on CT of her abdomen and pelvis. She has some improvement this morning but states the pain medication is wearing off quickly.   REVIEW OF SYSTEMS:  Review of Systems  Constitutional: Negative for chills, diaphoresis and fever.  Respiratory: Negative for cough and shortness of breath.   Cardiovascular: Negative for chest pain, palpitations and leg swelling.  Gastrointestinal: Positive for abdominal pain and nausea. Negative for constipation, diarrhea and vomiting.  Genitourinary: Positive for dysuria and flank pain.  Musculoskeletal: Positive for back pain.  Neurological: Negative for loss of consciousness and weakness.   DRUG ALLERGIES:   Allergies  Allergen Reactions  . Kiwi Extract Anaphylaxis  . Amoxicillin Hives, Rash and Other (See Comments)    Blisters Has patient had a PCN reaction causing immediate rash, facial/tongue/throat swelling, SOB or lightheadedness with hypotension: No Has patient had a PCN reaction causing severe rash involving mucus membranes or skin necrosis: Yes Has patient had a PCN reaction that required hospitalization: No Has patient had a PCN reaction occurring within the last 10 years: Unknown If all of the above answers are "NO", then may proceed with Cephalosporin use.   Marland Kitchen Penicillins Rash and Other (See Comments)    Has patient had a PCN reaction causing immediate rash, facial/tongue/throat swelling, SOB or lightheadedness with  hypotension:no Has patient had a PCN reaction causing severe rash involving mucus membranes or skin necrosis: yes Has patient had a PCN reaction that required hospitalization no Has patient had a PCN reaction occurring within the last 10 years: yes If all of the above answers are "NO", then may proceed with Cephalosporin use.   . Tramadol Itching  . Ciprofloxacin   . Cranberry Rash  . Sulfa Antibiotics Rash and Other (See Comments)    BLISTERS   VITALS:  Blood pressure 128/90, pulse 89, temperature 97.9 F (36.6 C), temperature source Oral, resp. rate 20, height 4\' 11"  (1.499 m), weight 92.1 kg, SpO2 97 %. PHYSICAL EXAMINATION:  Physical Exam  Gen: Alert and Oriented x 3, NAD HEENT: Normocephalic, atraumatic, PERRLA, EOMI CV: RRR, no murmurs, normal S1, S2 split Resp: CTAB, no wheezing, rales, or rhonchi, comfortable work of breathing Abd: non-distended, tender to palpation in the left upper and lower quadrant, soft, +bs in all four quadrants Ext: no clubbing, cyanosis, or edema Skin: warm, dry, intact, no rashes LABORATORY PANEL:  Female CBC Recent Labs  Lab 03/30/19 0449  WBC 9.8  HGB 14.0  HCT 40.1  PLT 203   ------------------------------------------------------------------------------------------------------------------ Chemistries  Recent Labs  Lab 03/29/19 1551 03/30/19 0449  NA 136 139  K 4.1 3.7  CL 105 111  CO2 22 23  GLUCOSE 101* 100*  BUN 13 12  CREATININE 0.76 1.02*  CALCIUM 9.5 8.6*  AST 87*  --   ALT 113*  --   ALKPHOS 78  --   BILITOT 1.1  --    RADIOLOGY:  Ct Renal Stone Study  Result Date: 03/29/2019 CLINICAL DATA:  Neg preg test today. Triage note: Pt comes EMS from home after having sharp lower left abdominal pain and not being able to urinate since 9am. EMS reports a mass like area on back side near left kidney that is tender with palpation. EXAM: CT ABDOMEN AND PELVIS WITHOUT CONTRAST TECHNIQUE: Multidetector CT imaging of the abdomen and  pelvis was performed following the standard protocol without IV contrast. COMPARISON:  CT abdomen pelvis 12/17/2015 FINDINGS: Lower chest: No acute abnormality. Hepatobiliary: No focal liver abnormality is seen. No gallstones, gallbladder wall thickening, or biliary dilatation. Pancreas: Unremarkable Spleen: Normal in size without focal abnormality. Adrenals/Urinary Tract: Adrenal glands are unremarkable. Asymmetric enlargement of the left kidney. There are several left renal calculi, largest in the inferior pole measuring 3 mm. There is mild left hydronephrosis and ureterectasis with a 3 mm calculus at the left UVJ likely causing the mild upstream obstruction. There are a couple of punctate renal calculi in the right kidney. No hydronephrosis. Bladder is unremarkable. Stomach/Bowel: Stomach is within normal limits. Appendix appears normal. No evidence of bowel wall thickening, distention, or inflammatory changes. Vascular/Lymphatic: No significant vascular findings are present. No enlarged abdominal or pelvic lymph nodes. Reproductive: Uterus and bilateral adnexa are unremarkable. IUD in place. Other: No abdominal wall hernia or abnormality. No abdominopelvic ascites. Musculoskeletal: No acute or significant osseous findings. IMPRESSION: 1. Mild to moderate left hydronephrosis and ureterectasis likely secondary to a 3 mm stone in the region of the left UVJ. 2. Nonobstructive right renal calculi. Electronically Signed   By: Emmaline KluverNancy  Ballantyne M.D.   On: 03/29/2019 16:36   ASSESSMENT AND PLAN:   Nephrolithasis. 3mm stone found at left UVJ with mild to moderate hydronephrosis that will hopefully pass on its own. Still having pain with nausea.  - Cont IV fluids  - Cont Phenergan for nausea/vomiting - Pain control: Dilaudid 1mg  increased frequency to every 2hrs prn; cont Norco 1-2 tabs q4hrs prn - Cont Toradol 30mg  q6 prn for total of 5 days - Strain urine for stone analysis once it passes - If no improvement  in the next 24hrs will get Urology consult for possible need for lithotrypsy. - Consider starting Flomax to help pass stone  AKI. Most likely from nephrolithasis as patient did show mild to moderate left sided hydronephrosis. - daily BMET - Cont IVFs @ 11225ml/hr - Consider stopping Toradol if no improvement by tomorrow  Leukocytosis. Most likely stress reaction due to nephrolithasis. See problem above. - Cont to monitor CBC daily   All the records are reviewed and case discussed with Care Management/Social Worker. Management plans discussed with the patient, family and they are in agreement.  CODE STATUS: Full Code  TOTAL TIME TAKING CARE OF THIS PATIENT: 45 minutes.   More than 50% of the time was spent in counseling/coordination of care: YES  POSSIBLE D/C IN 1-2 DAYS, DEPENDING ON CLINICAL CONDITION.   Arlyce Harmanimothy Neville Walston M.D on 03/30/2019 at 9:56 AM  Between 7am to 6pm - Pager - [phone]  After 6pm go to [email or web address] - Social research officer, governmentpassword EPAS [facility]  [facility] [place] [facility]  Office  [phone]  CC: Primary care physician; Evelene CroonNiemeyer, Meindert, MD  Note: This dictation was prepared with Dragon dictation along with smaller phrase technology. Any transcriptional errors that result from this process are unintentional.

## 2019-03-30 NOTE — Progress Notes (Signed)
The patient has not passed any stones during the dayshift. Pain and nausea controlled with PRN medications. No Vomiting today.

## 2019-03-31 ENCOUNTER — Other Ambulatory Visit: Payer: Self-pay

## 2019-03-31 ENCOUNTER — Telehealth: Payer: Self-pay | Admitting: Urology

## 2019-03-31 DIAGNOSIS — N200 Calculus of kidney: Secondary | ICD-10-CM

## 2019-03-31 LAB — CBC
HCT: 38.3 % (ref 36.0–46.0)
Hemoglobin: 13 g/dL (ref 12.0–15.0)
MCH: 33.3 pg (ref 26.0–34.0)
MCHC: 33.9 g/dL (ref 30.0–36.0)
MCV: 98.2 fL (ref 80.0–100.0)
Platelets: 185 10*3/uL (ref 150–400)
RBC: 3.9 MIL/uL (ref 3.87–5.11)
RDW: 12.2 % (ref 11.5–15.5)
WBC: 6.8 10*3/uL (ref 4.0–10.5)
nRBC: 0 % (ref 0.0–0.2)

## 2019-03-31 LAB — BASIC METABOLIC PANEL
Anion gap: 6 (ref 5–15)
BUN: 10 mg/dL (ref 6–20)
CO2: 21 mmol/L — ABNORMAL LOW (ref 22–32)
Calcium: 8.6 mg/dL — ABNORMAL LOW (ref 8.9–10.3)
Chloride: 112 mmol/L — ABNORMAL HIGH (ref 98–111)
Creatinine, Ser: 0.84 mg/dL (ref 0.44–1.00)
GFR calc Af Amer: >60 mL/min (ref >60)
GFR calc non Af Amer: >60 mL/min (ref >60)
Glucose, Bld: 106 mg/dL — ABNORMAL HIGH (ref 70–99)
Potassium: 3.8 mmol/L (ref 3.5–5.1)
Sodium: 139 mmol/L (ref 135–145)

## 2019-03-31 LAB — HIV ANTIBODY (ROUTINE TESTING W REFLEX): HIV Screen 4th Generation wRfx: NONREACTIVE

## 2019-03-31 LAB — GLUCOSE, CAPILLARY: Glucose-Capillary: 95 mg/dL (ref 70–99)

## 2019-03-31 MED ORDER — TRAZODONE HCL 50 MG PO TABS: 50.0000 mg | ORAL_TABLET | Freq: Every evening | ORAL | 0 refills | Status: DC | PRN

## 2019-03-31 MED ORDER — ONDANSETRON HCL 4 MG PO TABS: 4.0000 mg | ORAL_TABLET | Freq: Four times a day (QID) | ORAL | 0 refills | Status: DC | PRN

## 2019-03-31 MED ORDER — PROMETHAZINE HCL 25 MG/ML IJ SOLN: 25.0000 mg | Freq: Four times a day (QID) | INTRAMUSCULAR | Status: DC | PRN

## 2019-03-31 MED ORDER — HYDROCODONE-ACETAMINOPHEN 5-325 MG PO TABS: 1.0000 | ORAL_TABLET | ORAL | 0 refills | Status: AC | PRN

## 2019-03-31 MED ORDER — PROMETHAZINE HCL 25 MG PO TABS: 25.0000 mg | ORAL_TABLET | Freq: Four times a day (QID) | ORAL | 0 refills | Status: DC | PRN

## 2019-03-31 MED ORDER — PROMETHAZINE HCL 25 MG PO TABS: 25.0000 mg | ORAL_TABLET | Freq: Four times a day (QID) | ORAL | Status: DC | PRN

## 2019-03-31 MED ORDER — TAMSULOSIN HCL 0.4 MG PO CAPS: 0.4000 mg | ORAL_CAPSULE | Freq: Every day | ORAL | 0 refills | Status: DC

## 2019-03-31 NOTE — Telephone Encounter (Signed)
-----   Message from Billey Co, MD sent at 03/31/2019 11:26 AM EDT ----- Regarding: RE: follow up Need to tell her, thanks ----- Message ----- From: Benard Halsted Sent: 03/31/2019  11:22 AM EDT To: Billey Co, MD Subject: RE: follow up                                  Does she know to get the KUB or do we need to tell her? ----- Message ----- From: Billey Co, MD Sent: 03/31/2019  10:52 AM EDT To: [facility] Subject: follow up                                      Please set her up to see me next week Tuesday or Thursday with KUB prior, ok to overbook, thanks  Nickolas Madrid, MD 03/31/2019

## 2019-03-31 NOTE — Telephone Encounter (Signed)
App made and patient is aware ° °Brooke Aguirre °

## 2019-03-31 NOTE — Plan of Care (Signed)

## 2019-03-31 NOTE — Discharge Summary (Signed)
[facility] [place] NAME: Brooke Aguirre    MR#:  [id number]  DATE OF BIRTH:  Dec 23, 1995  DATE OF ADMISSION:  03/29/2019   ADMITTING PHYSICIAN: Max Sane, MD  DATE OF DISCHARGE: No discharge date for patient encounter.  PRIMARY CARE PHYSICIAN: Lorelee Market, MD   ADMISSION DIAGNOSIS:  Kidney stone [N20.0] Nausea and vomiting, intractability of vomiting not specified, unspecified vomiting type [R11.2] DISCHARGE DIAGNOSIS:  Active Problems:   Nephrolithiasis   Left nephrolithiasis  SECONDARY DIAGNOSIS:   Past Medical History:  Diagnosis Date  . Abnormal Pap smear of cervix   . Chronic kidney disease    CHRONIC  RIGHT PYELONEPHRITIS  . Family history of adverse reaction to anesthesia    PTS MOM WENT INTO [facility] AFTER HYSTERECTOMY  . Heart murmur   . HPV in female   . Hypothyroidism   . Thyroid goiter    HOSPITAL COURSE:   Brooke Aguirre is a 23y/o female with PMH of tobacco use disorder and obesity who presented to the ED due to dysuria, and left flank pain with associated nausea and vomiting. She was found to have left sided hydronephrosis with a 36mm stone at the UVJ on the left. She was given pain medication, phenegran, and IV fluids and started on tamulosin. After two days in the hospital her pain and nausea improved and she was able to tolerate foods. She was overall feeling well and had no complaints on day of discharge.  DISCHARGE CONDITIONS:  Good CONSULTS OBTAINED:  Urology DRUG ALLERGIES:   Allergies  Allergen Reactions  . Kiwi Extract Anaphylaxis  . Amoxicillin Hives, Rash and Other (See Comments)    Blisters Has patient had a PCN reaction causing immediate rash, facial/tongue/throat swelling, SOB or lightheadedness with hypotension: No Has patient had a PCN reaction causing severe rash involving mucus membranes or skin necrosis: Yes Has patient had a PCN reaction that required hospitalization: No Has  patient had a PCN reaction occurring within the last 10 years: Unknown If all of the above answers are "NO", then may proceed with Cephalosporin use.   Marland Kitchen Penicillins Rash and Other (See Comments)    Has patient had a PCN reaction causing immediate rash, facial/tongue/throat swelling, SOB or lightheadedness with hypotension:no Has patient had a PCN reaction causing severe rash involving mucus membranes or skin necrosis: yes Has patient had a PCN reaction that required hospitalization no Has patient had a PCN reaction occurring within the last 10 years: yes If all of the above answers are "NO", then may proceed with Cephalosporin use.   . Tramadol Itching  . Ciprofloxacin   . Cranberry Rash  . Sulfa Antibiotics Rash and Other (See Comments)    BLISTERS   DISCHARGE MEDICATIONS:   Allergies as of 03/31/2019      Reactions   Kiwi Extract Anaphylaxis   Amoxicillin Hives, Rash, Other (See Comments)   Blisters Has patient had a PCN reaction causing immediate rash, facial/tongue/throat swelling, SOB or lightheadedness with hypotension: No Has patient had a PCN reaction causing severe rash involving mucus membranes or skin necrosis: Yes Has patient had a PCN reaction that required hospitalization: No Has patient had a PCN reaction occurring within the last 10 years: Unknown If all of the above answers are "NO", then may proceed with Cephalosporin use.   Penicillins Rash, Other (See Comments)   Has patient had a PCN reaction causing immediate rash, facial/tongue/throat swelling, SOB or lightheadedness with hypotension:no Has patient  had a PCN reaction causing severe rash involving mucus membranes or skin necrosis: yes Has patient had a PCN reaction that required hospitalization no Has patient had a PCN reaction occurring within the last 10 years: yes If all of the above answers are "NO", then may proceed with Cephalosporin use.   Tramadol Itching   Ciprofloxacin    Cranberry Rash   Sulfa  Antibiotics Rash, Other (See Comments)   BLISTERS      Medication List    STOP taking these medications   lidocaine 2 % solution Commonly known as: XYLOCAINE     TAKE these medications   albuterol 108 (90 Base) MCG/ACT inhaler Commonly known as: VENTOLIN HFA Inhale 2 puffs into the lungs every 4 (four) hours as needed for wheezing or shortness of breath.   fluticasone 50 MCG/ACT nasal spray Commonly known as: FLONASE Place 2 sprays into both nostrils daily.   HYDROcodone-acetaminophen 5-325 MG tablet Commonly known as: NORCO/VICODIN Take 1 tablet by mouth every 4 (four) hours as needed for pain. What changed: Another medication with the same name was added. Make sure you understand how and when to take each.   HYDROcodone-acetaminophen 5-325 MG tablet Commonly known as: NORCO/VICODIN Take 1-2 tablets by mouth every 4 (four) hours as needed for up to 3 days for moderate pain. What changed: You were already taking a medication with the same name, and this prescription was added. Make sure you understand how and when to take each.   ibuprofen 400 MG tablet Commonly known as: ADVIL Take 400 mg by mouth 3 (three) times daily as needed for pain.   promethazine 25 MG tablet Commonly known as: PHENERGAN Take 1 tablet (25 mg total) by mouth every 6 (six) hours as needed for nausea or vomiting.   tamsulosin 0.4 MG Caps capsule Commonly known as: FLOMAX Take 1 capsule (0.4 mg total) by mouth daily. Start taking on: April 01, 2019        DISCHARGE INSTRUCTIONS:  Please follow up with your PCP and Urology clinic.  Take pain medication only as needed After you pass the kidney stone you may stop taking Flomax DIET:  Regular diet DISCHARGE CONDITION:  Good ACTIVITY:  Activity as tolerated OXYGEN:  Home Oxygen: No.  Oxygen Delivery: room air DISCHARGE LOCATION:  home   If you experience worsening of your admission symptoms, develop shortness of breath, life  threatening emergency, suicidal or homicidal thoughts you must seek medical attention immediately by calling 911 or calling your MD immediately  if symptoms less severe.  You Must read complete instructions/literature along with all the possible adverse reactions/side effects for all the Medicines you take and that have been prescribed to you. Take any new Medicines after you have completely understood and accpet all the possible adverse reactions/side effects.   Please note  You were cared for by a hospitalist during your hospital stay. If you have any questions about your discharge medications or the care you received while you were in the hospital after you are discharged, you can call the unit and asked to speak with the hospitalist on call if the hospitalist that took care of you is not available. Once you are discharged, your primary care physician will handle any further medical issues. Please note that NO REFILLS for any discharge medications will be authorized once you are discharged, as it is imperative that you return to your primary care physician (or establish a relationship with a primary care physician if you do not  have one) for your aftercare needs so that they can reassess your need for medications and monitor your lab values.    On the day of Discharge:  VITAL SIGNS:  Blood pressure (!) 134/96, pulse 76, temperature 98.1 F (36.7 C), temperature source Oral, resp. rate 20, height 4\' 11"  (1.499 m), weight 94 kg, SpO2 96 %. PHYSICAL EXAMINATION:  GENERAL:  [age] y.o.-year-old patient lying in the bed with no acute distress. Gen: Alert and Oriented x 3, NAD HEENT: Normocephalic, atraumatic, PERRLA, EOMI CV: RRR, no murmurs, normal S1, S2 split Resp: CTAB, no wheezing, rales, or rhonchi, comfortable work of breathing Abd: non-distended, mildly TTP LUQ, soft, +bs in all four quadrants Ext: no clubbing, cyanosis, or edema Skin: warm, dry, intact, no rashes DATA REVIEW:   CBC  Recent Labs  Lab 03/31/19 0433  WBC 6.8  HGB 13.0  HCT 38.3  PLT 185    Chemistries  Recent Labs  Lab 03/29/19 1551  03/31/19 0433  NA 136   < > 139  K 4.1   < > 3.8  CL 105   < > 112*  CO2 22   < > 21*  GLUCOSE 101*   < > 106*  BUN 13   < > 10  CREATININE 0.76   < > 0.84  CALCIUM 9.5   < > 8.6*  AST 87*  --   --   ALT 113*  --   --   ALKPHOS 78  --   --   BILITOT 1.1  --   --    < > = values in this interval not displayed.     Microbiology Results  Results for orders placed or performed during the hospital encounter of 03/29/19  SARS Coronavirus 2 [facility](Hospital order, Performed [facility]) Nasopharyngeal Nasopharyngeal Swab     Status: None   Collection Time: 03/29/19  5:58 PM   Specimen: Nasopharyngeal Swab  Result Value Ref Range Status   SARS Coronavirus 2 NEGATIVE NEGATIVE Final    Comment: (NOTE) If result is NEGATIVE SARS-CoV-2 target nucleic acids are NOT DETECTED. The SARS-CoV-2 RNA is generally detectable in upper and lower  respiratory specimens during the acute phase of infection. The lowest  concentration of SARS-CoV-2 viral copies this assay can detect is 250  copies / mL. A negative result does not preclude SARS-CoV-2 infection  and should not be used as the sole basis for treatment or other  patient management decisions.  A negative result may occur with  improper specimen collection / handling, submission of specimen other  than nasopharyngeal swab, presence of viral mutation(s) within the  areas targeted by this assay, and inadequate number of viral copies  (<250 copies / mL). A negative result must be combined with clinical  observations, patient history, and epidemiological information. If result is POSITIVE SARS-CoV-2 target nucleic acids are DETECTED. The SARS-CoV-2 RNA is generally detectable in upper and lower  respiratory specimens dur ing the acute phase of infection.  Positive  results are indicative of active infection  with SARS-CoV-2.  Clinical  correlation with patient history and other diagnostic information is  necessary to determine patient infection status.  Positive results do  not rule out bacterial infection or co-infection with other viruses. If result is PRESUMPTIVE POSTIVE SARS-CoV-2 nucleic acids MAY BE PRESENT.   A presumptive positive result was obtained on the submitted specimen  and confirmed on repeat testing.  While 2019 novel coronavirus  (SARS-CoV-2) nucleic acids may be  present in the submitted sample  additional confirmatory testing may be necessary for epidemiological  and / or clinical management purposes  to differentiate between  SARS-CoV-2 and other Sarbecovirus currently known to infect humans.  If clinically indicated additional testing with an alternate test  methodology [id number]) is advised. The SARS-CoV-2 RNA is generally  detectable in upper and lower respiratory sp ecimens during the acute  phase of infection. The expected result is Negative. Fact Sheet for Patients:  BoilerBrush.com.cyhttps://www.fda.gov/media/136312/download Fact Sheet for Healthcare Providers: https://pope.com/https://www.fda.gov/media/136313/download This test is not yet approved or cleared by the [place] FDA and has been authorized for detection and/or diagnosis of SARS-CoV-2 by FDA under an Emergency Use Authorization (EUA).  This EUA will remain in effect (meaning this test can be used) for the duration of the COVID-19 declaration under Section 564(b)(1) of the Act, 21 U.S.C. section 360bbb-3(b)(1), unless the authorization is terminated or revoked sooner. Performed at [facility], [address] [place], [place] [address]     RADIOLOGY:  CT Renal Stone Study IMPRESSION: 1. Mild to moderate left hydronephrosis and ureterectasis likely secondary to a 3 mm stone in the region of the left UVJ. 2. Nonobstructive right renal calculi.  Management plans discussed with the patient, family and they are in  agreement.  CODE STATUS: Full Code   TOTAL TIME TAKING CARE OF THIS PATIENT: 45 minutes.    Arlyce Harmanimothy Weslee Fogg M.D on 03/31/2019 at 10:23 AM  Between 7am to 6pm - Pager - [phone]  After 6pm go to [email or web address] - Social research officer, governmentpassword EPAS [facility]  [facility] [place] [facility]  Office  [phone]  CC: Primary care physician; Evelene CroonNiemeyer, Meindert, MD   Note: This dictation was prepared with Dragon dictation along with smaller phrase technology. Any transcriptional errors that result from this process are unintentional.

## 2019-03-31 NOTE — Discharge Instructions (Signed)

## 2019-03-31 NOTE — Progress Notes (Addendum)
[facility] [place] [facility]   PATIENT NAME: Brooke Aguirre    MR#:  [id number]  DATE OF BIRTH:  [birth year]  SUBJECTIVE:  CHIEF COMPLAINT:   Chief Complaint  Patient presents with  . Abdominal Pain  . Urinary Retention   Patient states this am she feels much improved. She has not required any pain medication since around midnight. She is still requiring some nausea medication but is tolerating food and drink.  REVIEW OF SYSTEMS:  Review of Systems  Constitutional: Negative for chills, diaphoresis and fever.  Respiratory: Negative for cough and shortness of breath.   Cardiovascular: Negative for chest pain, palpitations and leg swelling.  Gastrointestinal: Positive for abdominal pain and nausea. Negative for constipation, diarrhea and vomiting.  Genitourinary: Positive for flank pain. Negative for dysuria.  Musculoskeletal: Negative for back pain.  Neurological: Negative for loss of consciousness and weakness.   DRUG ALLERGIES:   Allergies  Allergen Reactions  . Kiwi Extract Anaphylaxis  . Amoxicillin Hives, Rash and Other (See Comments)    Blisters Has patient had a PCN reaction causing immediate rash, facial/tongue/throat swelling, SOB or lightheadedness with hypotension: No Has patient had a PCN reaction causing severe rash involving mucus membranes or skin necrosis: Yes Has patient had a PCN reaction that required hospitalization: No Has patient had a PCN reaction occurring within the last 10 years: Unknown If all of the above answers are "NO", then may proceed with Cephalosporin use.   Marland Kitchen. Penicillins Rash and Other (See Comments)    Has patient had a PCN reaction causing immediate rash, facial/tongue/throat swelling, SOB or lightheadedness with hypotension:no Has patient had a PCN reaction causing severe rash involving mucus membranes or skin necrosis: yes Has patient had a PCN reaction that required hospitalization no Has patient had a PCN  reaction occurring within the last 10 years: yes If all of the above answers are "NO", then may proceed with Cephalosporin use.   . Tramadol Itching  . Ciprofloxacin   . Cranberry Rash  . Sulfa Antibiotics Rash and Other (See Comments)    BLISTERS   VITALS:  Blood pressure 126/68, pulse 85, temperature 98.2 F (36.8 C), temperature source Oral, resp. rate 20, height 4\' 11"  (1.499 m), weight 94 kg, SpO2 95 %. PHYSICAL EXAMINATION:  Physical Exam  Gen: Alert and Oriented x 3, NAD CV: RRR, no murmurs, normal S1, S2 split Resp: CTAB, no wheezing, rales, or rhonchi, comfortable work of breathing Abd: non-distended, mildly TTP LUQ, soft, +bs in all four quadrants Skin: warm, dry, intact, no rashes LABORATORY PANEL:  Female CBC Recent Labs  Lab 03/31/19 0433  WBC 6.8  HGB 13.0  HCT 38.3  PLT 185   ------------------------------------------------------------------------------------------------------------------ Chemistries  Recent Labs  Lab 03/29/19 1551  03/31/19 0433  NA 136   < > 139  K 4.1   < > 3.8  CL 105   < > 112*  CO2 22   < > 21*  GLUCOSE 101*   < > 106*  BUN 13   < > 10  CREATININE 0.76   < > 0.84  CALCIUM 9.5   < > 8.6*  AST 87*  --   --   ALT 113*  --   --   ALKPHOS 78  --   --   BILITOT 1.1  --   --    < > = values in this interval not displayed.   RADIOLOGY:  No results found. ASSESSMENT AND PLAN:  Nephrolithasis. 58mm stone found at left UVJ with mild to moderate hydronephrosis that will hopefully pass on its own. Pain is better controlled  - D/c IV fluids; encourage oral rehydration - Cont oral Phenergan for nausea - Pain control: D/c Dilaudid; cont Norco 1-2 tabs q4hrs prn - D/c Toradol 30mg  q6 prn for total of 5 days - Cont Flomax - Cont to strain urine for stone analysis once it passes - If pain continues to improve will recommend urology outpatient  AKI. Resolved. Scr improved from 1.02 to 0.84 this am. Most likely from nephrolithasis as  patient did show mild to moderate left sided hydronephrosis. - daily BMET  Leukocytosis. Resolved. Most likely stress reaction due to nephrolithasis. See problem above.  All the records are reviewed and case discussed with Care Management/Social Worker. Management plans discussed with the patient, family and they are in agreement.  CODE STATUS: Full Code  TOTAL TIME TAKING CARE OF THIS PATIENT: 45 minutes.   More than 50% of the time was spent in counseling/coordination of care: YES  POSSIBLE D/C IN  TODAY, DEPENDING ON CLINICAL CONDITION.   Nuala Alpha M.D on 03/31/2019 at 7:26 AM  Between 7am to 6pm - Pager - [phone]  After 6pm go to [email or web address] - Proofreader  [facility]  Office  [phone]  CC: Primary care physician; Lorelee Market, MD  Note: This dictation was prepared with Dragon dictation along with smaller phrase technology. Any transcriptional errors that result from this process are unintentional.

## 2019-04-04 ENCOUNTER — Other Ambulatory Visit: Payer: Self-pay | Admitting: Urology

## 2019-04-04 ENCOUNTER — Ambulatory Visit: Payer: Medicaid Other | Admitting: Urology

## 2019-04-04 DIAGNOSIS — N200 Calculus of kidney: Secondary | ICD-10-CM

## 2019-04-04 NOTE — Progress Notes (Signed)
kub

## 2019-04-05 ENCOUNTER — Ambulatory Visit
Admission: RE | Admit: 2019-04-05 | Discharge: 2019-04-05 | Disposition: A | Discharge status: Home / Self Care | Payer: Medicaid Other | Source: Ambulatory Visit | Attending: Urology | Admitting: Urology

## 2019-04-05 ENCOUNTER — Ambulatory Visit (INDEPENDENT_AMBULATORY_CARE_PROVIDER_SITE_OTHER): Payer: Medicaid Other | Admitting: Urology

## 2019-04-05 ENCOUNTER — Encounter: Payer: Self-pay | Admitting: Urology

## 2019-04-05 ENCOUNTER — Other Ambulatory Visit
Admission: RE | Admit: 2019-04-05 | Discharge: 2019-04-05 | Disposition: A | Discharge status: Home / Self Care | Payer: Medicaid Other | Source: Ambulatory Visit | Attending: Urology | Admitting: Urology

## 2019-04-05 ENCOUNTER — Ambulatory Visit
Admission: RE | Admit: 2019-04-05 | Discharge: 2019-04-05 | Disposition: A | Discharge status: Home / Self Care | Payer: Medicaid Other | Attending: Urology | Admitting: Urology

## 2019-04-05 ENCOUNTER — Other Ambulatory Visit: Payer: Self-pay

## 2019-04-05 VITALS — BP 114/88 | HR 108 | Ht 59.0 in | Wt 203.0 lb

## 2019-04-05 DIAGNOSIS — N200 Calculus of kidney: Secondary | ICD-10-CM | POA: Diagnosis not present

## 2019-04-05 DIAGNOSIS — R8290 Unspecified abnormal findings in urine: Secondary | ICD-10-CM | POA: Insufficient documentation

## 2019-04-05 LAB — URINALYSIS, COMPLETE (UACMP) WITH MICROSCOPIC
Bacteria, UA: NONE SEEN
Bilirubin Urine: NEGATIVE
Glucose, UA: NEGATIVE mg/dL
Hgb urine dipstick: NEGATIVE
Ketones, ur: NEGATIVE mg/dL
Nitrite: NEGATIVE
Protein, ur: NEGATIVE mg/dL
RBC / HPF: NONE SEEN RBC/hpf (ref 0–5)
Specific Gravity, Urine: <1.005 — ABNORMAL LOW (ref 1.005–1.030)
pH: 6 (ref 5.0–8.0)

## 2019-04-05 NOTE — Progress Notes (Signed)
04/05/19 11:09 AM   [name] 1995-09-27 [id number]  Referring provider: Lorelee Market, MD [place],  [place] [address]  CC: Kidney stone  HPI: I saw Ms. Townley in urology clinic today for evaluation of kidney stones.  She is a 23 year old healthy female with no prior episode of nephrolithiasis who was recently admitted to the hospital overnight for uncontrolled pain secondary to a non-infected 3 mm left distal ureteral stone seen on CT.  At that time she had severe uncontrolled left renal colic and nausea/vomiting.  Her pain was controlled and she was discharged home with medical expulsive therapy.  Her pain currently is completely resolved and she denies any complaints today.  KUB today shows spontaneous passage of her 3 mm left ureteral stone that was able to be seen on prior scout CT.  She does have 3 mm non-obstructive stones bilaterally in the kidneys, left lower pole and right mid pole.  She strongly desires intervention for these non-obstructive stones.  There are no aggravating or alleviating factors.  Severity is mild.  PMH: Past Medical History:  Diagnosis Date  . Abnormal Pap smear of cervix   . Chronic kidney disease    CHRONIC  RIGHT PYELONEPHRITIS  . Family history of adverse reaction to anesthesia    PTS MOM WENT INTO COMA AFTER HYSTERECTOMY  . Heart murmur   . HPV in female   . Hypothyroidism   . Kidney stone   . Thyroid goiter     Surgical History: Past Surgical History:  Procedure Laterality Date  . LEEP N/A 05/03/2018   Procedure: LOOP ELECTROSURGICAL EXCISION PROCEDURE (LEEP);  Surgeon: Brayton Mars, MD;  Location: [facility] ORS;  Service: Gynecology;  Laterality: N/A;  . REMOVAL OF DRUG DELIVERY IMPLANT Left 12/15/2014   Procedure: REMOVAL OF DRUG DELIVERY IMPLANT;  Surgeon: Gae Dry, MD;  Location: [facility] ORS;  Service: Gynecology;  Laterality: Left;    Allergies:  Allergies  Allergen Reactions  . Kiwi Extract  Anaphylaxis  . Amoxicillin Hives, Rash and Other (See Comments)    Blisters Has patient had a PCN reaction causing immediate rash, facial/tongue/throat swelling, SOB or lightheadedness with hypotension: No Has patient had a PCN reaction causing severe rash involving mucus membranes or skin necrosis: Yes Has patient had a PCN reaction that required hospitalization: No Has patient had a PCN reaction occurring within the last 10 years: Unknown If all of the above answers are "NO", then may proceed with Cephalosporin use.   Marland Kitchen Penicillins Rash and Other (See Comments)    Has patient had a PCN reaction causing immediate rash, facial/tongue/throat swelling, SOB or lightheadedness with hypotension:no Has patient had a PCN reaction causing severe rash involving mucus membranes or skin necrosis: yes Has patient had a PCN reaction that required hospitalization no Has patient had a PCN reaction occurring within the last 10 years: yes If all of the above answers are "NO", then may proceed with Cephalosporin use.   . Tramadol Itching  . Ciprofloxacin   . Cranberry Rash  . Sulfa Antibiotics Rash and Other (See Comments)    BLISTERS    Family History: Family History  Problem Relation Age of Onset  . Breast cancer Neg Hx   . Ovarian cancer Neg Hx   . Colon cancer Neg Hx     Social History:  reports that she has been smoking cigarettes. She has a 5.00 pack-year smoking history. She has never used smokeless tobacco. She reports current alcohol use. She  reports that she does not use drugs.  ROS: Please see flowsheet from today's date for complete review of systems.  Physical Exam: BP 114/88 (BP Location: Left Arm, Patient Position: Sitting, Cuff Size: Large)   Pulse (!) 108   Ht 4\' 11"  (1.499 m)   Wt 203 lb (92.1 kg)   BMI 41.00 kg/m    Constitutional:  Alert and oriented, No acute distress. Cardiovascular: No clubbing, cyanosis, or edema. Respiratory: Normal respiratory effort, no  increased work of breathing. GI: Abdomen is soft, nontender, nondistended, no abdominal masses GU: No CVA tenderness Lymph: No cervical or inguinal lymphadenopathy. Skin: No rashes, bruises or suspicious lesions. Neurologic: Grossly intact, no focal deficits, moving all 4 extremities. Psychiatric: Normal mood and affect.  Pertinent Imaging: I have personally reviewed the CT dated 03/29/2019, as well as the KUB today.  Previous left 3 mm distal ureteral stone appears to have passed spontaneously and cannot be seen on KUB today.  The bilateral nonobstructive 3 mm renal stones are clearly seen on KUB. 400HU ea, ~11cm SSD.  Assessment & Plan:   In summary, the patient is a 23 year old healthy female with a spontaneously passed left 3 mm ureteral stone.  She has residual bilateral nonobstructive 3 mm renal stones.  She strongly desires intervention for the stones, she had severe pain, nausea/vomiting, and required hospitalization for pain control with her previous stone.  We discussed various treatment options for urolithiasis including observation with or without medical expulsive therapy, shockwave lithotripsy (SWL), ureteroscopy and laser lithotripsy with stent placement, and percutaneous nephrolithotomy.  We discussed that management is based on stone size, location, density, patient co-morbidities, and patient preference.   Stones <12mm in size have a >80% spontaneous passage rate. Data surrounding the use of tamsulosin for medical expulsive therapy is controversial, but meta analyses suggests it is most efficacious for distal stones between 5-81mm in size. Possible side effects include dizziness/lightheadedness, and retrograde ejaculation.  SWL has a lower stone free rate in a single procedure, but also a lower complication rate compared to ureteroscopy and avoids a stent and associated stent related symptoms. Possible complications include renal hematoma, steinstrasse, and need for additional  treatment.  Ureteroscopy with laser lithotripsy and stent placement has a higher stone free rate than SWL in a single procedure, however increased complication rate including possible infection, ureteral injury, bleeding, and stent related morbidity. Common stent related symptoms include dysuria, urgency/frequency, and flank pain.  I recommended observation for her non-obstructive 3 mm stones bilaterally, however the patient strongly desires intervention.  We discussed the differences between shockwave lithotripsy and ureteroscopy, and she would like to pursue elective shockwave lithotripsy.  I discussed that this would need to be performed in staged fashion.  We discussed general stone prevention strategies including adequate hydration with goal of producing 2.5 L of urine daily, increasing citric acid intake, increasing calcium intake during high oxalate meals, minimizing animal protein, and decreasing salt intake. Information about dietary recommendations given today.   Schedule left shockwave lithotripsy for non-obstructive 3 mm lower pole stone, if good result and tolerates well, would pursue right shockwave lithotripsy as well per patient request  9m, MD  [facility] [address], [address] [place], [place] [id number]

## 2019-04-05 NOTE — Addendum Note (Signed)
Addended by: Donalee Citrin on: 04/05/2019 11:17 AM   Modules accepted: Orders

## 2019-04-05 NOTE — H&P (View-Only) (Signed)
04/05/19 11:09 AM   [name] 1995-09-27 [id number]  Referring provider: Lorelee Market, MD [place],  [place] [address]  CC: Kidney stone  HPI: I saw Ms. Townley in urology clinic today for evaluation of kidney stones.  She is a 23 year old healthy female with no prior episode of nephrolithiasis who was recently admitted to the hospital overnight for uncontrolled pain secondary to a non-infected 3 mm left distal ureteral stone seen on CT.  At that time she had severe uncontrolled left renal colic and nausea/vomiting.  Her pain was controlled and she was discharged home with medical expulsive therapy.  Her pain currently is completely resolved and she denies any complaints today.  KUB today shows spontaneous passage of her 3 mm left ureteral stone that was able to be seen on prior scout CT.  She does have 3 mm non-obstructive stones bilaterally in the kidneys, left lower pole and right mid pole.  She strongly desires intervention for these non-obstructive stones.  There are no aggravating or alleviating factors.  Severity is mild.  PMH: Past Medical History:  Diagnosis Date  . Abnormal Pap smear of cervix   . Chronic kidney disease    CHRONIC  RIGHT PYELONEPHRITIS  . Family history of adverse reaction to anesthesia    PTS MOM WENT INTO COMA AFTER HYSTERECTOMY  . Heart murmur   . HPV in female   . Hypothyroidism   . Kidney stone   . Thyroid goiter     Surgical History: Past Surgical History:  Procedure Laterality Date  . LEEP N/A 05/03/2018   Procedure: LOOP ELECTROSURGICAL EXCISION PROCEDURE (LEEP);  Surgeon: Brayton Mars, MD;  Location: [facility] ORS;  Service: Gynecology;  Laterality: N/A;  . REMOVAL OF DRUG DELIVERY IMPLANT Left 12/15/2014   Procedure: REMOVAL OF DRUG DELIVERY IMPLANT;  Surgeon: Gae Dry, MD;  Location: [facility] ORS;  Service: Gynecology;  Laterality: Left;    Allergies:  Allergies  Allergen Reactions  . Kiwi Extract  Anaphylaxis  . Amoxicillin Hives, Rash and Other (See Comments)    Blisters Has patient had a PCN reaction causing immediate rash, facial/tongue/throat swelling, SOB or lightheadedness with hypotension: No Has patient had a PCN reaction causing severe rash involving mucus membranes or skin necrosis: Yes Has patient had a PCN reaction that required hospitalization: No Has patient had a PCN reaction occurring within the last 10 years: Unknown If all of the above answers are "NO", then may proceed with Cephalosporin use.   Marland Kitchen Penicillins Rash and Other (See Comments)    Has patient had a PCN reaction causing immediate rash, facial/tongue/throat swelling, SOB or lightheadedness with hypotension:no Has patient had a PCN reaction causing severe rash involving mucus membranes or skin necrosis: yes Has patient had a PCN reaction that required hospitalization no Has patient had a PCN reaction occurring within the last 10 years: yes If all of the above answers are "NO", then may proceed with Cephalosporin use.   . Tramadol Itching  . Ciprofloxacin   . Cranberry Rash  . Sulfa Antibiotics Rash and Other (See Comments)    BLISTERS    Family History: Family History  Problem Relation Age of Onset  . Breast cancer Neg Hx   . Ovarian cancer Neg Hx   . Colon cancer Neg Hx     Social History:  reports that she has been smoking cigarettes. She has a 5.00 pack-year smoking history. She has never used smokeless tobacco. She reports current alcohol use. She  reports that she does not use drugs.  ROS: Please see flowsheet from today's date for complete review of systems.  Physical Exam: BP 114/88 (BP Location: Left Arm, Patient Position: Sitting, Cuff Size: Large)   Pulse (!) 108   Ht 4' 11" (1.499 m)   Wt 203 lb (92.1 kg)   BMI 41.00 kg/m    Constitutional:  Alert and oriented, No acute distress. Cardiovascular: No clubbing, cyanosis, or edema. Respiratory: Normal respiratory effort, no  increased work of breathing. GI: Abdomen is soft, nontender, nondistended, no abdominal masses GU: No CVA tenderness Lymph: No cervical or inguinal lymphadenopathy. Skin: No rashes, bruises or suspicious lesions. Neurologic: Grossly intact, no focal deficits, moving all 4 extremities. Psychiatric: Normal mood and affect.  Pertinent Imaging: I have personally reviewed the CT dated 03/29/2019, as well as the KUB today.  Previous left 3 mm distal ureteral stone appears to have passed spontaneously and cannot be seen on KUB today.  The bilateral nonobstructive 3 mm renal stones are clearly seen on KUB. 400HU ea, ~11cm SSD.  Assessment & Plan:   In summary, the patient is a 23-year-old healthy female with a spontaneously passed left 3 mm ureteral stone.  She has residual bilateral nonobstructive 3 mm renal stones.  She strongly desires intervention for the stones, she had severe pain, nausea/vomiting, and required hospitalization for pain control with her previous stone.  We discussed various treatment options for urolithiasis including observation with or without medical expulsive therapy, shockwave lithotripsy (SWL), ureteroscopy and laser lithotripsy with stent placement, and percutaneous nephrolithotomy.  We discussed that management is based on stone size, location, density, patient co-morbidities, and patient preference.   Stones <5mm in size have a >80% spontaneous passage rate. Data surrounding the use of tamsulosin for medical expulsive therapy is controversial, but meta analyses suggests it is most efficacious for distal stones between 5-10mm in size. Possible side effects include dizziness/lightheadedness, and retrograde ejaculation.  SWL has a lower stone free rate in a single procedure, but also a lower complication rate compared to ureteroscopy and avoids a stent and associated stent related symptoms. Possible complications include renal hematoma, steinstrasse, and need for additional  treatment.  Ureteroscopy with laser lithotripsy and stent placement has a higher stone free rate than SWL in a single procedure, however increased complication rate including possible infection, ureteral injury, bleeding, and stent related morbidity. Common stent related symptoms include dysuria, urgency/frequency, and flank pain.  I recommended observation for her non-obstructive 3 mm stones bilaterally, however the patient strongly desires intervention.  We discussed the differences between shockwave lithotripsy and ureteroscopy, and she would like to pursue elective shockwave lithotripsy.  I discussed that this would need to be performed in staged fashion.  We discussed general stone prevention strategies including adequate hydration with goal of producing 2.5 L of urine daily, increasing citric acid intake, increasing calcium intake during high oxalate meals, minimizing animal protein, and decreasing salt intake. Information about dietary recommendations given today.   Schedule left shockwave lithotripsy for non-obstructive 3 mm lower pole stone, if good result and tolerates well, would pursue right shockwave lithotripsy as well per patient request  Joretta Eads C Shacoria Latif, MD  [facility] [address], [address] [place], [place] [address] [phone]   

## 2019-04-05 NOTE — Patient Instructions (Addendum)
Dietary Guidelines to Help Prevent Kidney Stones Kidney stones are deposits of minerals and salts that form inside your kidneys. Your risk of developing kidney stones may be greater depending on your diet, your lifestyle, the medicines you take, and whether you have certain medical conditions. Most people can reduce their chances of developing kidney stones by following the instructions below. Depending on your overall health and the type of kidney stones you tend to develop, your dietitian may give you more specific instructions. What are tips for following this plan? Reading food labels  Choose foods with "no salt added" or "low-salt" labels. Limit your sodium intake to less than 1500 mg per day.  Choose foods with calcium for each meal and snack. Try to eat about 300 mg of calcium at each meal. Foods that contain 200-500 mg of calcium per serving include: ? 8 oz (237 ml) of milk, fortified nondairy milk, and fortified fruit juice. ? 8 oz (237 ml) of kefir, yogurt, and soy yogurt. ? 4 oz (118 ml) of tofu. ? 1 oz of cheese. ? 1 cup (300 g) of dried figs. ? 1 cup (91 g) of cooked broccoli. ? 1-3 oz can of sardines or mackerel.  Most people need 1000 to 1500 mg of calcium each day. Talk to your dietitian about how much calcium is recommended for you. Shopping  Buy plenty of fresh fruits and vegetables. Most people do not need to avoid fruits and vegetables, even if they contain nutrients that may contribute to kidney stones.  When shopping for convenience foods, choose: ? Whole pieces of fruit. ? Premade salads with dressing on the side. ? Low-fat fruit and yogurt smoothies.  Avoid buying frozen meals or prepared deli foods.  Look for foods with live cultures, such as yogurt and kefir. Cooking  Do not add salt to food when cooking. Place a salt shaker on the table and allow each person to add his or her own salt to taste.  Use vegetable protein, such as beans, textured vegetable  protein (TVP), or tofu instead of meat in pasta, casseroles, and soups. Meal planning   Eat less salt, if told by your dietitian. To do this: ? Avoid eating processed or premade food. ? Avoid eating fast food.  Eat less animal protein, including cheese, meat, poultry, or fish, if told by your dietitian. To do this: ? Limit the number of times you have meat, poultry, fish, or cheese each week. Eat a diet free of meat at least 2 days a week. ? Eat only one serving each day of meat, poultry, fish, or seafood. ? When you prepare animal protein, cut pieces into small portion sizes. For most meat and fish, one serving is about the size of one deck of cards.  Eat at least 5 servings of fresh fruits and vegetables each day. To do this: ? Keep fruits and vegetables on hand for snacks. ? Eat 1 piece of fruit or a handful of berries with breakfast. ? Have a salad and fruit at lunch. ? Have two kinds of vegetables at dinner.  Limit foods that are high in a substance called oxalate. These include: ? Spinach. ? Rhubarb. ? Beets. ? Potato chips and french fries. ? Nuts.  If you regularly take a diuretic medicine, make sure to eat at least 1-2 fruits or vegetables high in potassium each day. These include: ? Avocado. ? Banana. ? Orange, prune, carrot, or tomato juice. ? Baked potato. ? Cabbage. ? Beans and split   peas. General instructions   Drink enough fluid to keep your urine clear or pale yellow. This is the most important thing you can do.  Talk to your health care provider and dietitian about taking daily supplements. Depending on your health and the cause of your kidney stones, you may be advised: ? Not to take supplements with vitamin C. ? To take a calcium supplement. ? To take a daily probiotic supplement. ? To take other supplements such as magnesium, fish oil, or vitamin B6.  Take all medicines and supplements as told by your health care provider.  Limit alcohol intake to no  more than 1 drink a day for nonpregnant women and 2 drinks a day for men. One drink equals 12 oz of beer, 5 oz of wine, or 1 oz of hard liquor.  Lose weight if told by your health care provider. Work with your dietitian to find strategies and an eating plan that works best for you. What foods are not recommended? Limit your intake of the following foods, or as told by your dietitian. Talk to your dietitian about specific foods you should avoid based on the type of kidney stones and your overall health. Grains Breads. Bagels. Rolls. Baked goods. Salted crackers. Cereal. Pasta. Vegetables Spinach. Rhubarb. Beets. Canned vegetables. Pickles. Olives. Meats and other protein foods Nuts. Nut butters. Large portions of meat, poultry, or fish. Salted or cured meats. Deli meats. Hot dogs. Sausages. Dairy Cheese. Beverages Regular soft drinks. Regular vegetable juice. Seasonings and other foods Seasoning blends with salt. Salad dressings. Canned soups. Soy sauce. Ketchup. Barbecue sauce. Canned pasta sauce. Casseroles. Pizza. Lasagna. Frozen meals. Potato chips. French fries. Summary  You can reduce your risk of kidney stones by making changes to your diet.  The most important thing you can do is drink enough fluid. You should drink enough fluid to keep your urine clear or pale yellow.  Ask your health care provider or dietitian how much protein from animal sources you should eat each day, and also how much salt and calcium you should have each day. This information is not intended to replace advice given to you by your health care provider. Make sure you discuss any questions you have with your health care provider. Document Released: 10/25/2010 Document Revised: 10/20/2018 Document Reviewed: 06/10/2016 Elsevier Patient Education  2020 [employer]   Lithotripsy  Lithotripsy is a treatment that can sometimes help eliminate kidney stones and the pain that they cause. A form of lithotripsy,  also known as extracorporeal shock wave lithotripsy, is a nonsurgical procedure that crushes a kidney stone with shock waves. These shock waves pass through your body and focus on the kidney stone. They cause the kidney stone to break up while it is still in the urinary tract. This makes it easier for the smaller pieces of stone to pass in the urine. Tell a health care provider about:  Any allergies you have.  All medicines you are taking, including vitamins, herbs, eye drops, creams, and over-the-counter medicines.  Any blood disorders you have.  Any surgeries you have had.  Any medical conditions you have.  Whether you are pregnant or may be pregnant.  Any problems you or family members have had with anesthetic medicines. What are the risks? Generally, this is a safe procedure. However, problems may occur, including:  Infection.  Bleeding of the kidney.  Bruising of the kidney or skin.  Scarring of the kidney, which can lead to: ? Increased blood pressure. ? Poor   kidney function. ? Return (recurrence) of kidney stones.  Damage to other structures or organs, such as the liver, colon, spleen, or pancreas.  Blockage (obstruction) of the the tube that carries urine from the kidney to the bladder (ureter).  Failure of the kidney stone to break into pieces (fragments). What happens before the procedure? Staying hydrated Follow instructions from your health care provider about hydration, which may include:  Up to 2 hours before the procedure - you may continue to drink clear liquids, such as water, clear fruit juice, black coffee, and plain tea. Eating and drinking restrictions Follow instructions from your health care provider about eating and drinking, which may include:  8 hours before the procedure - stop eating heavy meals or foods such as meat, fried foods, or fatty foods.  6 hours before the procedure - stop eating light meals or foods, such as toast or cereal.  6  hours before the procedure - stop drinking milk or drinks that contain milk.  2 hours before the procedure - stop drinking clear liquids. General instructions  Plan to have someone take you home from the hospital or clinic.  Ask your health care provider about: ? Changing or stopping your regular medicines. This is especially important if you are taking diabetes medicines or blood thinners. ? Taking medicines such as aspirin and ibuprofen. These medicines and other NSAIDs can thin your blood. Do not take these medicines for 7 days before your procedure if your health care provider instructs you not to.  You may have tests, such as: ? Blood tests. ? Urine tests. ? Imaging tests, such as a CT scan. What happens during the procedure?  To lower your risk of infection: ? Your health care team will wash or sanitize their hands. ? Your skin will be washed with soap.  An IV tube will be inserted into one of your veins. This tube will give you fluids and medicines.  You will be given one or more of the following: ? A medicine to help you relax (sedative). ? A medicine to make you fall asleep (general anesthetic).  A water-filled cushion may be placed behind your kidney or on your abdomen. In some cases you may be placed in a tub of lukewarm water.  Your body will be positioned in a way that makes it easy to target the kidney stone.  A flexible tube with holes in it (stent) may be placed in the ureter. This will help keep urine flowing from the kidney if the fragments of the stone have been blocking the ureter.  An X-ray or ultrasound exam will be done to locate your stone.  Shock waves will be aimed at the stone. If you are awake, you may feel a tapping sensation as the shock waves pass through your body. The procedure may vary among health care providers and hospitals. What happens after the procedure?  You may have an X-ray to see whether the procedure was able to break up the kidney  stone and how much of the stone has passed. If large stone fragments remain after treatment, you may need to have a second procedure at a later time.  Your blood pressure, heart rate, breathing rate, and blood oxygen level will be monitored until the medicines you were given have worn off.  You may be given antibiotics or pain medicine as needed.  If a stent was placed in your ureter during surgery, it may stay in place for a few weeks.  You   may need strain your urine to collect pieces of the kidney stone for testing.  You will need to drink plenty of water.  Do not drive for 24 hours if you were given a sedative. Summary  Lithotripsy is a treatment that can sometimes help eliminate kidney stones and the pain that they cause.  A form of lithotripsy, also known as extracorporeal shock wave lithotripsy, is a nonsurgical procedure that crushes a kidney stone with shock waves.  Generally, this is a safe procedure. However, problems may occur, including damage to the kidney or other organs, infection, or obstruction of the tube that carries urine from the kidney to the bladder (ureter).  When you go home, you will need to drink plenty of water. You may be asked to strain your urine to collect pieces of the kidney stone for testing. This information is not intended to replace advice given to you by your health care provider. Make sure you discuss any questions you have with your health care provider. Document Released: 06/27/2000 Document Revised: 10/11/2018 Document Reviewed: 05/21/2016 Elsevier Patient Education  2020 [employer] Inc.  

## 2019-04-06 LAB — URINE CULTURE

## 2019-04-07 ENCOUNTER — Encounter: Payer: Self-pay | Admitting: Radiology

## 2019-04-07 ENCOUNTER — Telehealth: Payer: Self-pay | Admitting: Physician Assistant

## 2019-04-07 ENCOUNTER — Other Ambulatory Visit: Payer: Self-pay | Admitting: Radiology

## 2019-04-07 DIAGNOSIS — N898 Other specified noninflammatory disorders of vagina: Secondary | ICD-10-CM

## 2019-04-07 DIAGNOSIS — N200 Calculus of kidney: Secondary | ICD-10-CM

## 2019-04-07 MED ORDER — FLUCONAZOLE 150 MG PO TABS: 150.0000 mg | ORAL_TABLET | Freq: Once | ORAL | 0 refills | Status: AC

## 2019-04-07 MED ORDER — NYSTATIN 100000 UNIT/GM EX CREA: 1.0000 "application " | TOPICAL_CREAM | Freq: Two times a day (BID) | CUTANEOUS | 0 refills | Status: DC

## 2019-04-07 NOTE — Progress Notes (Signed)
We are sorry that you are not feeling well. Here is how we plan to help! Based on what you shared with me it looks like you: May have a yeast vaginosis  Vaginosis is an inflammation of the vagina that can result in discharge, itching and pain. The cause is usually a change in the normal balance of vaginal bacteria or an infection. Vaginosis can also result from reduced estrogen levels after menopause.  The most common causes of vaginosis are:   Bacterial vaginosis which results from an overgrowth of one on several organisms that are normally present in your vagina.   Yeast infections which are caused by a naturally occurring fungus called candida.   Vaginal atrophy (atrophic vaginosis) which results from the thinning of the vagina from reduced estrogen levels after menopause.   Trichomoniasis which is caused by a parasite and is commonly transmitted by sexual intercourse.  Factors that increase your risk of developing vaginosis include: Marland Kitchen Medications, such as antibiotics and steroids . Uncontrolled diabetes . Use of hygiene products such as bubble bath, vaginal spray or vaginal deodorant . Douching . Wearing damp or tight-fitting clothing . Using an intrauterine device (IUD) for birth control . Hormonal changes, such as those associated with pregnancy, birth control pills or menopause . Sexual activity . Having a sexually transmitted infection  Your treatment plan is A single Diflucan (fluconazole) 150mg  tablet once.  I have electronically sent this prescription into the pharmacy that you have chosen. I have also given a prescription for Nystatin cream that you should use twice daily for 7 days.   Be sure to take all of the medication as directed. Stop taking any medication if you develop a rash, tongue swelling or shortness of breath. Mothers who are breast feeding should consider pumping and discarding their breast milk while on these antibiotics. However, there is no consensus that  infant exposure at these doses would be harmful.  Remember that medication creams can weaken latex condoms. Marland Kitchen   HOME CARE:  Good hygiene may prevent some types of vaginosis from recurring and may relieve some symptoms:  . Avoid baths, hot tubs and whirlpool spas. Rinse soap from your outer genital area after a shower, and dry the area well to prevent irritation. Don't use scented or harsh soaps, such as those with deodorant or antibacterial action. Marland Kitchen Avoid irritants. These include scented tampons and pads. . Wipe from front to back after using the toilet. Doing so avoids spreading fecal bacteria to your vagina.  Other things that may help prevent vaginosis include:  Marland Kitchen Don't douche. Your vagina doesn't require cleansing other than normal bathing. Repetitive douching disrupts the normal organisms that reside in the vagina and can actually increase your risk of vaginal infection. Douching won't clear up a vaginal infection. . Use a latex condom. Both female and female latex condoms may help you avoid infections spread by sexual contact. . Wear cotton underwear. Also wear pantyhose with a cotton crotch. If you feel comfortable without it, skip wearing underwear to bed. Yeast thrives in [employer] Your symptoms should improve in the next day or two.  GET HELP RIGHT AWAY IF:  . You have pain in your lower abdomen ( pelvic area or over your ovaries) . You develop nausea or vomiting . You develop a fever . Your discharge changes or worsens . You have persistent pain with intercourse . You develop shortness of breath, a rapid pulse, or you faint.  These symptoms could be signs of  problems or infections that need to be evaluated by a medical provider now.  MAKE SURE YOU    Understand these instructions.  Will watch your condition.  Will get help right away if you are not doing well or get worse.  Your e-visit answers were reviewed by a board certified advanced clinical  practitioner to complete your personal care plan. Depending upon the condition, your plan could have included both over the counter or prescription medications. Please review your pharmacy choice to make sure that you have choses a pharmacy that is open for you to pick up any needed prescription, Your safety is important to [place]. If you have drug allergies check your prescription carefully.   You can use MyChart to ask questions about today's visit, request a non-urgent call back, or ask for a work or school excuse for 24 hours related to this e-Visit. If it has been greater than 24 hours you will need to follow up with your provider, or enter a new e-Visit to address those concerns. You will get a MyChart message within the next two days asking about your experience. I hope that your e-visit has been valuable and will speed your recovery.  Greater than 5 minutes, yet less than 10 minutes of time have been spend researching, coordinating, and implementing care for this patient today.

## 2019-04-11 ENCOUNTER — Other Ambulatory Visit
Admission: RE | Admit: 2019-04-11 | Discharge: 2019-04-11 | Disposition: A | Discharge status: Home / Self Care | Payer: Medicaid Other | Source: Ambulatory Visit | Attending: Urology | Admitting: Urology

## 2019-04-11 ENCOUNTER — Other Ambulatory Visit: Payer: Self-pay

## 2019-04-11 DIAGNOSIS — N200 Calculus of kidney: Secondary | ICD-10-CM | POA: Insufficient documentation

## 2019-04-11 DIAGNOSIS — Z20828 Contact with and (suspected) exposure to other viral communicable diseases: Secondary | ICD-10-CM | POA: Diagnosis not present

## 2019-04-11 DIAGNOSIS — Z01812 Encounter for preprocedural laboratory examination: Secondary | ICD-10-CM | POA: Diagnosis not present

## 2019-04-11 LAB — SARS CORONAVIRUS 2 (TAT 6-24 HRS): SARS Coronavirus 2: NEGATIVE

## 2019-04-14 ENCOUNTER — Ambulatory Visit: Payer: Medicaid Other

## 2019-04-14 ENCOUNTER — Encounter: Payer: Self-pay | Admitting: *Deleted

## 2019-04-14 ENCOUNTER — Encounter
Admission: RE | Disposition: A | Discharge status: Home / Self Care | Payer: Self-pay | Source: Home / Self Care | Attending: Urology

## 2019-04-14 ENCOUNTER — Other Ambulatory Visit: Payer: Self-pay

## 2019-04-14 ENCOUNTER — Ambulatory Visit
Admission: RE | Admit: 2019-04-14 | Discharge: 2019-04-14 | Disposition: A | Discharge status: Home / Self Care | Payer: Medicaid Other | Attending: Urology | Admitting: Urology

## 2019-04-14 DIAGNOSIS — E039 Hypothyroidism, unspecified: Secondary | ICD-10-CM | POA: Insufficient documentation

## 2019-04-14 DIAGNOSIS — Z881 Allergy status to other antibiotic agents status: Secondary | ICD-10-CM | POA: Diagnosis not present

## 2019-04-14 DIAGNOSIS — N200 Calculus of kidney: Secondary | ICD-10-CM | POA: Diagnosis not present

## 2019-04-14 DIAGNOSIS — Z882 Allergy status to sulfonamides status: Secondary | ICD-10-CM | POA: Insufficient documentation

## 2019-04-14 DIAGNOSIS — Z87442 Personal history of urinary calculi: Secondary | ICD-10-CM | POA: Diagnosis not present

## 2019-04-14 DIAGNOSIS — Z91018 Allergy to other foods: Secondary | ICD-10-CM | POA: Diagnosis not present

## 2019-04-14 DIAGNOSIS — Z880 Allergy status to penicillin: Secondary | ICD-10-CM | POA: Insufficient documentation

## 2019-04-14 DIAGNOSIS — E049 Nontoxic goiter, unspecified: Secondary | ICD-10-CM | POA: Diagnosis not present

## 2019-04-14 DIAGNOSIS — F17210 Nicotine dependence, cigarettes, uncomplicated: Secondary | ICD-10-CM | POA: Insufficient documentation

## 2019-04-14 DIAGNOSIS — N189 Chronic kidney disease, unspecified: Secondary | ICD-10-CM | POA: Diagnosis not present

## 2019-04-14 DIAGNOSIS — Z885 Allergy status to narcotic agent status: Secondary | ICD-10-CM | POA: Insufficient documentation

## 2019-04-14 DIAGNOSIS — R011 Cardiac murmur, unspecified: Secondary | ICD-10-CM | POA: Diagnosis not present

## 2019-04-14 DIAGNOSIS — Z888 Allergy status to other drugs, medicaments and biological substances status: Secondary | ICD-10-CM | POA: Insufficient documentation

## 2019-04-14 HISTORY — PX: EXTRACORPOREAL SHOCK WAVE LITHOTRIPSY: SHX1557

## 2019-04-14 LAB — POCT PREGNANCY, URINE: Preg Test, Ur: NEGATIVE

## 2019-04-14 SURGERY — LITHOTRIPSY, ESWL
Anesthesia: Moderate Sedation | Laterality: Left

## 2019-04-14 MED ORDER — ONDANSETRON HCL 4 MG/2ML IJ SOLN
INTRAMUSCULAR | Status: AC
  Filled 2019-04-14: qty 2

## 2019-04-14 MED ORDER — SODIUM CHLORIDE 0.9 % IV SOLN
INTRAVENOUS | Status: DC
  Administered 2019-04-14: 07:00:00 via INTRAVENOUS

## 2019-04-14 MED ORDER — ONDANSETRON HCL 4 MG/2ML IJ SOLN
4.0000 mg | Freq: Once | INTRAMUSCULAR | Status: AC
  Administered 2019-04-14: 4 mg via INTRAVENOUS

## 2019-04-14 MED ORDER — ONDANSETRON HCL 4 MG/2ML IJ SOLN
4.0000 mg | Freq: Once | INTRAMUSCULAR | Status: AC | PRN
  Administered 2019-04-14: 07:00:00 4 mg via INTRAVENOUS

## 2019-04-14 MED ORDER — DIPHENHYDRAMINE HCL 25 MG PO CAPS
25.0000 mg | ORAL_CAPSULE | ORAL | Status: AC
  Administered 2019-04-14: 06:00:00 25 mg via ORAL

## 2019-04-14 MED ORDER — HYDROCODONE-ACETAMINOPHEN 5-325 MG PO TABS: 1.0000 | ORAL_TABLET | ORAL | 0 refills | Status: DC | PRN

## 2019-04-14 MED ORDER — DIAZEPAM 5 MG PO TABS
ORAL_TABLET | ORAL | Status: AC
  Administered 2019-04-14: 06:00:00 10 mg via ORAL
  Filled 2019-04-14: qty 2

## 2019-04-14 MED ORDER — DIAZEPAM 5 MG PO TABS
10.0000 mg | ORAL_TABLET | ORAL | Status: AC
  Administered 2019-04-14: 06:00:00 10 mg via ORAL

## 2019-04-14 MED ORDER — DIPHENHYDRAMINE HCL 25 MG PO CAPS
ORAL_CAPSULE | ORAL | Status: AC
  Administered 2019-04-14: 25 mg via ORAL
  Filled 2019-04-14: qty 1

## 2019-04-14 MED ORDER — ONDANSETRON HCL 4 MG/2ML IJ SOLN
INTRAMUSCULAR | Status: AC
  Administered 2019-04-14: 4 mg via INTRAVENOUS
  Filled 2019-04-14: qty 2

## 2019-04-14 NOTE — Discharge Instructions (Signed)
As per the [facility] discharge instructions.  Follow-up appointment scheduled 10/15.   AMBULATORY SURGERY  DISCHARGE INSTRUCTIONS   1) The drugs that you were given will stay in your system until tomorrow so for the next 24 hours you should not:  A) Drive an automobile B) Make any legal decisions C) Drink any alcoholic beverage   2) You may resume regular meals tomorrow.  Today it is better to start with liquids and gradually work up to solid foods.  You may eat anything you prefer, but it is better to start with liquids, then soup and crackers, and gradually work up to solid foods.   3) Please notify your doctor immediately if you have any unusual bleeding, trouble breathing, redness and pain at the surgery site, drainage, fever, or pain not relieved by medication.    4) Additional Instructions:        Please contact your physician with any problems or Same Day Surgery at [phone], Monday through Friday 6 am to 4 pm, or [place] at [facility] number at [phone].

## 2019-04-14 NOTE — Interval H&P Note (Signed)
History and Physical Interval Note:  04/14/2019 7:46 AM  Brooke Aguirre  has presented today for surgery, with the diagnosis of KIDNEY STONE.  The various methods of treatment have been discussed with the patient and family. After consideration of risks, benefits and other options for treatment, the patient has consented to  Procedure(s): EXTRACORPOREAL SHOCK WAVE LITHOTRIPSY (ESWL) (Left) as a surgical intervention.  The patient's history has been reviewed, patient examined, no change in status, stable for surgery.  I have reviewed the patient's chart and labs.  Questions were answered to the patient's satisfaction.     [place]

## 2019-04-15 ENCOUNTER — Encounter: Payer: Self-pay | Admitting: Urology

## 2019-04-21 ENCOUNTER — Ambulatory Visit: Payer: Medicaid Other | Admitting: Urology

## 2019-04-28 ENCOUNTER — Ambulatory Visit (INDEPENDENT_AMBULATORY_CARE_PROVIDER_SITE_OTHER): Payer: Medicaid Other | Admitting: Urology

## 2019-04-28 ENCOUNTER — Ambulatory Visit
Admission: RE | Admit: 2019-04-28 | Discharge: 2019-04-28 | Disposition: A | Discharge status: Home / Self Care | Payer: Medicaid Other | Source: Ambulatory Visit | Attending: Urology | Admitting: Urology

## 2019-04-28 ENCOUNTER — Encounter: Payer: Self-pay | Admitting: Urology

## 2019-04-28 VITALS — BP 109/76 | HR 98 | Ht 59.0 in | Wt 203.0 lb

## 2019-04-28 DIAGNOSIS — N200 Calculus of kidney: Secondary | ICD-10-CM

## 2019-04-28 LAB — URINALYSIS, COMPLETE
Bilirubin, UA: NEGATIVE
Glucose, UA: NEGATIVE
Ketones, UA: NEGATIVE
Nitrite, UA: NEGATIVE
Protein,UA: NEGATIVE
Specific Gravity, UA: 1.01 (ref 1.005–1.030)
Urobilinogen, Ur: 0.2 mg/dL (ref 0.2–1.0)
pH, UA: 6 (ref 5.0–7.5)

## 2019-04-28 LAB — MICROSCOPIC EXAMINATION: RBC, Urine: NONE SEEN /hpf (ref 0–2)

## 2019-04-29 ENCOUNTER — Other Ambulatory Visit: Payer: Self-pay | Admitting: Radiology

## 2019-04-29 ENCOUNTER — Telehealth: Payer: Self-pay

## 2019-04-29 DIAGNOSIS — N200 Calculus of kidney: Secondary | ICD-10-CM

## 2019-04-29 NOTE — H&P (View-Only) (Signed)
04/28/2019 7:43 AM   Brooke Aguirre 01/30/1996 [id number]  Referring provider: Evelene Croon, MD [address] [place],  [place] [address]  Chief Complaint  Patient presents with  . Nephrolithiasis    HPI: 23 y.o. female previously hospitalized for a 3 mm distal ureteral calculus with severe renal colic.  She subsequently passed her stone however had small renal calculi.  Although these stones were nonobstructing and she requested treatment due to the experience of her prior episode of renal colic.  She is status post shockwave lithotripsy of a 3 mm left lower pole stone on 10/1.  She had no postoperative problems.  KUB performed today was reviewed and the previously seen stone is no longer identified.  Her right midpole calculus is easily seen.  She has requested treatment of her right renal calculus.  PMH: Past Medical History:  Diagnosis Date  . Abnormal Pap smear of cervix   . Chronic kidney disease    CHRONIC  RIGHT PYELONEPHRITIS  . Family history of adverse reaction to anesthesia    PTS MOM WENT INTO COMA AFTER HYSTERECTOMY  . Heart murmur   . HPV in female   . Hypothyroidism   . Kidney stone   . Thyroid goiter     Surgical History: Past Surgical History:  Procedure Laterality Date  . EXTRACORPOREAL SHOCK WAVE LITHOTRIPSY Left 04/14/2019   Procedure: EXTRACORPOREAL SHOCK WAVE LITHOTRIPSY (ESWL);  Surgeon: Riki Altes, MD;  Location: [facility] ORS;  Service: Urology;  Laterality: Left;  . LEEP N/A 05/03/2018   Procedure: LOOP ELECTROSURGICAL EXCISION PROCEDURE (LEEP);  Surgeon: Herold Harms, MD;  Location: [facility] ORS;  Service: Gynecology;  Laterality: N/A;  . REMOVAL OF DRUG DELIVERY IMPLANT Left 12/15/2014   Procedure: REMOVAL OF DRUG DELIVERY IMPLANT;  Surgeon: Nadara Mustard, MD;  Location: [facility] ORS;  Service: Gynecology;  Laterality: Left;    Home Medications:  Allergies as of 04/28/2019      Reactions   Kiwi Extract Anaphylaxis   Amoxicillin  Hives, Rash, Other (See Comments)   Blisters Has patient had a PCN reaction causing immediate rash, facial/tongue/throat swelling, SOB or lightheadedness with hypotension: No Has patient had a PCN reaction causing severe rash involving mucus membranes or skin necrosis: Yes Has patient had a PCN reaction that required hospitalization: No Has patient had a PCN reaction occurring within the last 10 years: Unknown If all of the above answers are "NO", then may proceed with Cephalosporin use.   Ciprofloxacin Hives   blisters   Penicillins Rash, Other (See Comments)   Has patient had a PCN reaction causing immediate rash, facial/tongue/throat swelling, SOB or lightheadedness with hypotension:no Has patient had a PCN reaction causing severe rash involving mucus membranes or skin necrosis: yes Has patient had a PCN reaction that required hospitalization no Has patient had a PCN reaction occurring within the last 10 years: yes If all of the above answers are "NO", then may proceed with Cephalosporin use.   Tramadol Itching   Cranberry Rash   Latex Rash   Sulfa Antibiotics Rash, Other (See Comments)   BLISTERS      Medication List       Accurate as of April 28, 2019 11:59 PM. If you have any questions, ask your nurse or doctor.        STOP taking these medications   albuterol 108 (90 Base) MCG/ACT inhaler Commonly known as: VENTOLIN HFA Stopped by: Riki Altes, MD   fluticasone 50 MCG/ACT nasal spray Commonly  known as: FLONASE Stopped by: Abbie Sons, MD     TAKE these medications   HYDROcodone-acetaminophen 5-325 MG tablet Commonly known as: NORCO/VICODIN Take 1 tablet by mouth every 4 (four) hours as needed.   ibuprofen 400 MG tablet Commonly known as: ADVIL Take 400 mg by mouth 3 (three) times daily as needed for pain.   nystatin cream Commonly known as: MYCOSTATIN Apply 1 application topically 2 (two) times daily.   ondansetron 4 MG tablet Commonly known as:  ZOFRAN Take 1 tablet (4 mg total) by mouth every 6 (six) hours as needed for nausea.   promethazine 25 MG tablet Commonly known as: PHENERGAN Take 1 tablet (25 mg total) by mouth every 6 (six) hours as needed for nausea or vomiting.   tamsulosin 0.4 MG Caps capsule Commonly known as: FLOMAX Take 1 capsule (0.4 mg total) by mouth daily.   traZODone 50 MG tablet Commonly known as: DESYREL Take 1 tablet (50 mg total) by mouth at bedtime as needed for sleep.       Allergies:  Allergies  Allergen Reactions  . Kiwi Extract Anaphylaxis  . Amoxicillin Hives, Rash and Other (See Comments)    Blisters Has patient had a PCN reaction causing immediate rash, facial/tongue/throat swelling, SOB or lightheadedness with hypotension: No Has patient had a PCN reaction causing severe rash involving mucus membranes or skin necrosis: Yes Has patient had a PCN reaction that required hospitalization: No Has patient had a PCN reaction occurring within the last 10 years: Unknown If all of the above answers are "NO", then may proceed with Cephalosporin use.   . Ciprofloxacin Hives    blisters  . Penicillins Rash and Other (See Comments)    Has patient had a PCN reaction causing immediate rash, facial/tongue/throat swelling, SOB or lightheadedness with hypotension:no Has patient had a PCN reaction causing severe rash involving mucus membranes or skin necrosis: yes Has patient had a PCN reaction that required hospitalization no Has patient had a PCN reaction occurring within the last 10 years: yes If all of the above answers are "NO", then may proceed with Cephalosporin use.   . Tramadol Itching  . Cranberry Rash  . Latex Rash  . Sulfa Antibiotics Rash and Other (See Comments)    BLISTERS    Family History: Family History  Problem Relation Age of Onset  . Breast cancer Neg Hx   . Ovarian cancer Neg Hx   . Colon cancer Neg Hx     Social History:  reports that she has been smoking cigarettes.  She has a 5.00 pack-year smoking history. She has never used smokeless tobacco. She reports current alcohol use. She reports that she does not use drugs.  ROS: UROLOGY Frequent Urination?: No Hard to postpone urination?: No Burning/pain with urination?: No Get up at night to urinate?: Yes Leakage of urine?: No Urine stream starts and stops?: No Trouble starting stream?: No Do you have to strain to urinate?: No Blood in urine?: No Urinary tract infection?: Yes Sexually transmitted disease?: No Injury to kidneys or bladder?: No Painful intercourse?: No Weak stream?: No Currently pregnant?: No Vaginal bleeding?: No Last menstrual period?: n  Gastrointestinal Nausea?: No Vomiting?: No Indigestion/heartburn?: No Diarrhea?: No Constipation?: No  Constitutional Fever: No Night sweats?: No Weight loss?: No  Skin Skin rash/lesions?: No Itching?: No  Eyes Blurred vision?: No Double vision?: No  Ears/Nose/Throat Sore throat?: No Sinus problems?: No  Hematologic/Lymphatic Swollen glands?: No Easy bruising?: No  Cardiovascular Leg swelling?: No  Chest pain?: No  Respiratory Cough?: No Shortness of breath?: No  Endocrine Excessive thirst?: No  Musculoskeletal Back pain?: No Joint pain?: No  Neurological Headaches?: No Dizziness?: No  Psychologic Depression?: No Anxiety?: No  Physical Exam: BP 109/76   Pulse 98   Ht 4' 11" (1.499 m)   Wt 203 lb (92.1 kg)   BMI 41.00 kg/m   Constitutional:  Alert and oriented, No acute distress. HEENT: [place] AT, moist mucus membranes.  Trachea midline, no masses. Cardiovascular: No clubbing, cyanosis, or edema.  RRR Respiratory: Normal respiratory effort, no increased work of breathing.  Clear GI: Abdomen is soft, nontender, nondistended, no abdominal masses GU: No CVA tenderness Lymph: No cervical or inguinal lymphadenopathy. Skin: No rashes, bruises or suspicious lesions. Neurologic: Grossly intact, no focal  deficits, moving all 4 extremities. Psychiatric: Normal mood and affect.   Pertinent Imaging: Images were personally reviewed Results for orders placed during the hospital encounter of 04/28/19  Abdomen 1 view (KUB)   Narrative CLINICAL DATA:  Right-sided pain. Known kidney stones. History of lithotripsy on the left side.  EXAM: ABDOMEN - 1 VIEW  COMPARISON:  04/14/2019  FINDINGS: Small stone in the midpole the right kidney, stable from prior radiographs. The adjacent tiny midpole right renal stone seen on the prior study is not visualized on the current exam.  Stone noted previously in the lower pole region the left kidney is not visualized on the current study. No other renal stones.  No evidence of a ureteral stone.  Normal bowel gas pattern. Stable mid pelvis IUD. Soft tissues otherwise unremarkable. Skeletal structures are unremarkable.  IMPRESSION: 1. No acute findings.  No evidence of a ureteral stone. 2. Previously seen left renal stone is no longer visualized. 3. Stable single right intrarenal stone. The second intrarenal stone noted on the prior study is not visualized   Electronically Signed   By: David  Ormond M.D.   On: 04/28/2019 14:54      Assessment & Plan:    - Nephrolithiasis Good result status post shockwave lithotripsy of a 3 mm nonobstructing left renal calculus.  She desires to schedule shockwave lithotripsy of her right renal calculus.  The indications and nature of the planned procedure were discussed as well as the potential benefits and expected outcome.  Alternatives were discussed as described above.  The most common complications and side effects were discussed as outlined in the [facility] consent form.  It was stressed that there is no guarantee that lithotripsy will be successful and she could require retreatment or alternative treatment.  The rare instance of perirenal bleeding requiring hospitalization, transfusion and  rarely surgery were discussed.  The possibility of renal colic from obstructing stone fragments requiring stent placement or ureteroscopy was also discussed.  She indicated all questions were answered and desires to proceed.   Gisselle Galvis C Neftaly Swiss, MD  [facility] [address], [address] [place], [place] [address] [phone]  

## 2019-04-29 NOTE — Progress Notes (Signed)
04/28/2019 7:43 AM   Brooke Aguirre 01/30/1996 [id number]  Referring provider: Evelene Croon, MD [address] [place],  [place] [address]  Chief Complaint  Patient presents with  . Nephrolithiasis    HPI: 23 y.o. female previously hospitalized for a 3 mm distal ureteral calculus with severe renal colic.  She subsequently passed her stone however had small renal calculi.  Although these stones were nonobstructing and she requested treatment due to the experience of her prior episode of renal colic.  She is status post shockwave lithotripsy of a 3 mm left lower pole stone on 10/1.  She had no postoperative problems.  KUB performed today was reviewed and the previously seen stone is no longer identified.  Her right midpole calculus is easily seen.  She has requested treatment of her right renal calculus.  PMH: Past Medical History:  Diagnosis Date  . Abnormal Pap smear of cervix   . Chronic kidney disease    CHRONIC  RIGHT PYELONEPHRITIS  . Family history of adverse reaction to anesthesia    PTS MOM WENT INTO COMA AFTER HYSTERECTOMY  . Heart murmur   . HPV in female   . Hypothyroidism   . Kidney stone   . Thyroid goiter     Surgical History: Past Surgical History:  Procedure Laterality Date  . EXTRACORPOREAL SHOCK WAVE LITHOTRIPSY Left 04/14/2019   Procedure: EXTRACORPOREAL SHOCK WAVE LITHOTRIPSY (ESWL);  Surgeon: Riki Altes, MD;  Location: [facility] ORS;  Service: Urology;  Laterality: Left;  . LEEP N/A 05/03/2018   Procedure: LOOP ELECTROSURGICAL EXCISION PROCEDURE (LEEP);  Surgeon: Herold Harms, MD;  Location: [facility] OR[facility];  Service: Gynecology;  Laterality: N/A;  . REMOVAL OF DRUG DELIVERY IMPLANT Left 12/15/2014   Procedure: REMOVAL OF DRUG DELIVERY IMPLANT;  Surgeon: Nadara Mustard, MD;  Location: [facility] ORS;  Service: Gynecology;  Laterality: Left;    Home Medications:  Allergies as of 04/28/2019      Reactions   Kiwi Extract Anaphylaxis   Amoxicillin  Hives, Rash, Other (See Comments)   Blisters Has patient had a PCN reaction causing immediate rash, facial/tongue/throat swelling, SOB or lightheadedness with hypotension: No Has patient had a PCN reaction causing severe rash involving mucus membranes or skin necrosis: Yes Has patient had a PCN reaction that required hospitalization: No Has patient had a PCN reaction occurring within the last 10 years: Unknown If all of the above answers are "NO", then may proceed with Cephalosporin use.   Ciprofloxacin Hives   blisters   Penicillins Rash, Other (See Comments)   Has patient had a PCN reaction causing immediate rash, facial/tongue/throat swelling, SOB or lightheadedness with hypotension:no Has patient had a PCN reaction causing severe rash involving mucus membranes or skin necrosis: yes Has patient had a PCN reaction that required hospitalization no Has patient had a PCN reaction occurring within the last 10 years: yes If all of the above answers are "NO", then may proceed with Cephalosporin use.   Tramadol Itching   Cranberry Rash   Latex Rash   Sulfa Antibiotics Rash, Other (See Comments)   BLISTERS      Medication List       Accurate as of April 28, 2019 11:59 PM. If you have any questions, ask your nurse or doctor.        STOP taking these medications   albuterol 108 (90 Base) MCG/ACT inhaler Commonly known as: VENTOLIN HFA Stopped by: Riki Altes, MD   fluticasone 50 MCG/ACT nasal spray Commonly  known as: FLONASE Stopped by: Abbie Sons, MD     TAKE these medications   HYDROcodone-acetaminophen 5-325 MG tablet Commonly known as: NORCO/VICODIN Take 1 tablet by mouth every 4 (four) hours as needed.   ibuprofen 400 MG tablet Commonly known as: ADVIL Take 400 mg by mouth 3 (three) times daily as needed for pain.   nystatin cream Commonly known as: MYCOSTATIN Apply 1 application topically 2 (two) times daily.   ondansetron 4 MG tablet Commonly known as:  ZOFRAN Take 1 tablet (4 mg total) by mouth every 6 (six) hours as needed for nausea.   promethazine 25 MG tablet Commonly known as: PHENERGAN Take 1 tablet (25 mg total) by mouth every 6 (six) hours as needed for nausea or vomiting.   tamsulosin 0.4 MG Caps capsule Commonly known as: FLOMAX Take 1 capsule (0.4 mg total) by mouth daily.   traZODone 50 MG tablet Commonly known as: DESYREL Take 1 tablet (50 mg total) by mouth at bedtime as needed for sleep.       Allergies:  Allergies  Allergen Reactions  . Kiwi Extract Anaphylaxis  . Amoxicillin Hives, Rash and Other (See Comments)    Blisters Has patient had a PCN reaction causing immediate rash, facial/tongue/throat swelling, SOB or lightheadedness with hypotension: No Has patient had a PCN reaction causing severe rash involving mucus membranes or skin necrosis: Yes Has patient had a PCN reaction that required hospitalization: No Has patient had a PCN reaction occurring within the last 10 years: Unknown If all of the above answers are "NO", then may proceed with Cephalosporin use.   . Ciprofloxacin Hives    blisters  . Penicillins Rash and Other (See Comments)    Has patient had a PCN reaction causing immediate rash, facial/tongue/throat swelling, SOB or lightheadedness with hypotension:no Has patient had a PCN reaction causing severe rash involving mucus membranes or skin necrosis: yes Has patient had a PCN reaction that required hospitalization no Has patient had a PCN reaction occurring within the last 10 years: yes If all of the above answers are "NO", then may proceed with Cephalosporin use.   . Tramadol Itching  . Cranberry Rash  . Latex Rash  . Sulfa Antibiotics Rash and Other (See Comments)    BLISTERS    Family History: Family History  Problem Relation Age of Onset  . Breast cancer Neg Hx   . Ovarian cancer Neg Hx   . Colon cancer Neg Hx     Social History:  reports that she has been smoking cigarettes.  She has a 5.00 pack-year smoking history. She has never used smokeless tobacco. She reports current alcohol use. She reports that she does not use drugs.  ROS: UROLOGY Frequent Urination?: No Hard to postpone urination?: No Burning/pain with urination?: No Get up at night to urinate?: Yes Leakage of urine?: No Urine stream starts and stops?: No Trouble starting stream?: No Do you have to strain to urinate?: No Blood in urine?: No Urinary tract infection?: Yes Sexually transmitted disease?: No Injury to kidneys or bladder?: No Painful intercourse?: No Weak stream?: No Currently pregnant?: No Vaginal bleeding?: No Last menstrual period?: n  Gastrointestinal Nausea?: No Vomiting?: No Indigestion/heartburn?: No Diarrhea?: No Constipation?: No  Constitutional Fever: No Night sweats?: No Weight loss?: No  Skin Skin rash/lesions?: No Itching?: No  Eyes Blurred vision?: No Double vision?: No  Ears/Nose/Throat Sore throat?: No Sinus problems?: No  Hematologic/Lymphatic Swollen glands?: No Easy bruising?: No  Cardiovascular Leg swelling?: No  Chest pain?: No  Respiratory Cough?: No Shortness of breath?: No  Endocrine Excessive thirst?: No  Musculoskeletal Back pain?: No Joint pain?: No  Neurological Headaches?: No Dizziness?: No  Psychologic Depression?: No Anxiety?: No  Physical Exam: BP 109/76   Pulse 98   Ht 4\' 11"  (1.499 m)   Wt 203 lb (92.1 kg)   BMI 41.00 kg/m   Constitutional:  Alert and oriented, No acute distress. HEENT: [place] AT, moist mucus membranes.  Trachea midline, no masses. Cardiovascular: No clubbing, cyanosis, or edema.  RRR Respiratory: Normal respiratory effort, no increased work of breathing.  Clear GI: Abdomen is soft, nontender, nondistended, no abdominal masses GU: No CVA tenderness Lymph: No cervical or inguinal lymphadenopathy. Skin: No rashes, bruises or suspicious lesions. Neurologic: Grossly intact, no focal  deficits, moving all 4 extremities. Psychiatric: Normal mood and affect.   Pertinent Imaging: Images were personally reviewed Results for orders placed during the hospital encounter of 04/28/19  Abdomen 1 view (KUB)   Narrative CLINICAL DATA:  Right-sided pain. Known kidney stones. History of lithotripsy on the left side.  EXAM: ABDOMEN - 1 VIEW  COMPARISON:  04/14/2019  FINDINGS: Small stone in the midpole the right kidney, stable from prior radiographs. The adjacent tiny midpole right renal stone seen on the prior study is not visualized on the current exam.  Stone noted previously in the lower pole region the left kidney is not visualized on the current study. No other renal stones.  No evidence of a ureteral stone.  Normal bowel gas pattern. Stable mid pelvis IUD. Soft tissues otherwise unremarkable. Skeletal structures are unremarkable.  IMPRESSION: 1. No acute findings.  No evidence of a ureteral stone. 2. Previously seen left renal stone is no longer visualized. 3. Stable single right intrarenal stone. The second intrarenal stone noted on the prior study is not visualized   Electronically Signed   By: Amie Portlandavid  Ormond M.D.   On: 04/28/2019 14:54      Assessment & Plan:    - Nephrolithiasis Good result status post shockwave lithotripsy of a 3 mm nonobstructing left renal calculus.  She desires to schedule shockwave lithotripsy of her right renal calculus.  The indications and nature of the planned procedure were discussed as well as the potential benefits and expected outcome.  Alternatives were discussed as described above.  The most common complications and side effects were discussed as outlined in the [facility] consent form.  It was stressed that there is no guarantee that lithotripsy will be successful and she could require retreatment or alternative treatment.  The rare instance of perirenal bleeding requiring hospitalization, transfusion and  rarely surgery were discussed.  The possibility of renal colic from obstructing stone fragments requiring stent placement or ureteroscopy was also discussed.  She indicated all questions were answered and desires to proceed.   Riki AltesScott C Jazelle Achey, MD  [facility] [address], [address] [place], [place] [address] [phone]

## 2019-04-29 NOTE — Telephone Encounter (Signed)
See my chart encounter.

## 2019-04-29 NOTE — Telephone Encounter (Signed)
-----   Message from Scott C Stoioff, MD sent at 04/29/2019  1:32 PM EDT ----- Urinalysis showed no evidence of infection 

## 2019-05-01 ENCOUNTER — Encounter: Payer: Self-pay | Admitting: Urology

## 2019-05-02 ENCOUNTER — Other Ambulatory Visit
Admission: RE | Admit: 2019-05-02 | Discharge: 2019-05-02 | Disposition: A | Discharge status: Home / Self Care | Payer: Medicaid Other | Source: Ambulatory Visit | Attending: Urology | Admitting: Urology

## 2019-05-02 ENCOUNTER — Other Ambulatory Visit: Payer: Self-pay

## 2019-05-02 DIAGNOSIS — Z01812 Encounter for preprocedural laboratory examination: Secondary | ICD-10-CM | POA: Insufficient documentation

## 2019-05-02 DIAGNOSIS — Z20828 Contact with and (suspected) exposure to other viral communicable diseases: Secondary | ICD-10-CM | POA: Diagnosis not present

## 2019-05-02 LAB — SARS CORONAVIRUS 2 (TAT 6-24 HRS): SARS Coronavirus 2: NEGATIVE

## 2019-05-03 ENCOUNTER — Encounter: Payer: Self-pay | Admitting: Radiology

## 2019-05-05 ENCOUNTER — Ambulatory Visit: Payer: Medicaid Other

## 2019-05-05 ENCOUNTER — Ambulatory Visit
Admission: RE | Admit: 2019-05-05 | Discharge: 2019-05-05 | Disposition: A | Discharge status: Home / Self Care | Payer: Medicaid Other | Attending: Urology | Admitting: Urology

## 2019-05-05 ENCOUNTER — Encounter
Admission: RE | Disposition: A | Discharge status: Home / Self Care | Payer: Self-pay | Source: Home / Self Care | Attending: Urology

## 2019-05-05 ENCOUNTER — Other Ambulatory Visit: Payer: Self-pay

## 2019-05-05 DIAGNOSIS — Z881 Allergy status to other antibiotic agents status: Secondary | ICD-10-CM | POA: Insufficient documentation

## 2019-05-05 DIAGNOSIS — Z7901 Long term (current) use of anticoagulants: Secondary | ICD-10-CM | POA: Diagnosis not present

## 2019-05-05 DIAGNOSIS — E669 Obesity, unspecified: Secondary | ICD-10-CM | POA: Insufficient documentation

## 2019-05-05 DIAGNOSIS — Z791 Long term (current) use of non-steroidal anti-inflammatories (NSAID): Secondary | ICD-10-CM | POA: Insufficient documentation

## 2019-05-05 DIAGNOSIS — E039 Hypothyroidism, unspecified: Secondary | ICD-10-CM | POA: Insufficient documentation

## 2019-05-05 DIAGNOSIS — N200 Calculus of kidney: Secondary | ICD-10-CM | POA: Diagnosis not present

## 2019-05-05 DIAGNOSIS — Z91040 Latex allergy status: Secondary | ICD-10-CM | POA: Diagnosis not present

## 2019-05-05 DIAGNOSIS — Z882 Allergy status to sulfonamides status: Secondary | ICD-10-CM | POA: Insufficient documentation

## 2019-05-05 DIAGNOSIS — N189 Chronic kidney disease, unspecified: Secondary | ICD-10-CM | POA: Insufficient documentation

## 2019-05-05 DIAGNOSIS — Z880 Allergy status to penicillin: Secondary | ICD-10-CM | POA: Diagnosis not present

## 2019-05-05 DIAGNOSIS — Z885 Allergy status to narcotic agent status: Secondary | ICD-10-CM | POA: Insufficient documentation

## 2019-05-05 DIAGNOSIS — F17210 Nicotine dependence, cigarettes, uncomplicated: Secondary | ICD-10-CM | POA: Insufficient documentation

## 2019-05-05 HISTORY — PX: EXTRACORPOREAL SHOCK WAVE LITHOTRIPSY: SHX1557

## 2019-05-05 LAB — POCT PREGNANCY, URINE: Preg Test, Ur: NEGATIVE

## 2019-05-05 SURGERY — LITHOTRIPSY, ESWL
Anesthesia: Moderate Sedation | Laterality: Right

## 2019-05-05 MED ORDER — DOXYCYCLINE HYCLATE 100 MG PO TABS
100.0000 mg | ORAL_TABLET | Freq: Once | ORAL | Status: AC
  Administered 2019-05-05: 100 mg via ORAL
  Filled 2019-05-05 (×2): qty 1

## 2019-05-05 MED ORDER — DIAZEPAM 5 MG PO TABS
ORAL_TABLET | ORAL | Status: AC
  Administered 2019-05-05: 10 mg via ORAL
  Filled 2019-05-05: qty 2

## 2019-05-05 MED ORDER — DIPHENHYDRAMINE HCL 25 MG PO CAPS
25.0000 mg | ORAL_CAPSULE | ORAL | Status: AC
  Administered 2019-05-05: 08:00:00 25 mg via ORAL

## 2019-05-05 MED ORDER — DIAZEPAM 5 MG PO TABS
10.0000 mg | ORAL_TABLET | ORAL | Status: AC
  Administered 2019-05-05: 08:00:00 10 mg via ORAL

## 2019-05-05 MED ORDER — DIPHENHYDRAMINE HCL 25 MG PO CAPS
ORAL_CAPSULE | ORAL | Status: AC
  Filled 2019-05-05: qty 1

## 2019-05-05 MED ORDER — HYDROCODONE-ACETAMINOPHEN 5-325 MG PO TABS: 1.0000 | ORAL_TABLET | ORAL | 0 refills | Status: DC | PRN

## 2019-05-05 MED ORDER — SODIUM CHLORIDE 0.9 % IV SOLN
INTRAVENOUS | Status: DC
  Administered 2019-05-05: 08:00:00 via INTRAVENOUS

## 2019-05-05 NOTE — Discharge Instructions (Signed)
As per the [place] stones in her discharge instructions

## 2019-05-05 NOTE — Interval H&P Note (Signed)
History and Physical Interval Note:  05/05/2019 8:47 AM  Brooke Aguirre  has presented today for surgery, with the diagnosis of Kidney stone.  The various methods of treatment have been discussed with the patient and family. After consideration of risks, benefits and other options for treatment, the patient has consented to  Procedure(s): EXTRACORPOREAL SHOCK WAVE LITHOTRIPSY (ESWL) (Right) as a surgical intervention.  The patient's history has been reviewed, patient examined, no change in status, stable for surgery.  I have reviewed the patient's chart and labs.  Questions were answered to the patient's satisfaction.     [place]

## 2019-05-09 NOTE — Progress Notes (Deleted)
05/10/2019 9:04 PM   Brooke HarkJessie L Aguirre Jan 25, 1996 [id number]  Referring provider: Evelene CroonNiemeyer, [place], MD [address] [facility] [place],  [place] [id number]  No chief complaint on file.   HPI: Brooke Aguirre is a 23 y.o. female who is status post right ESWL who presents today for post operative pain.  History of recurrent pyelonephritis in her teens.  VCUG ruled out reflux.  ID recommended probiotics.   CT renal stone study on 03/29/2019 revealed adrenal glands are unremarkable. Asymmetric enlargement of the left kidney. There are several left renal calculi, largest in the inferior pole measuring 3 mm. There is mild left hydronephrosis and ureterectasis with a 3 mm calculus at the left UVJ likely causing the mild upstream obstruction. There are a couple of punctate renal calculi in the right kidney. No hydronephrosis. Bladder is unremarkable.  She experienced spontaneous passage of the 3 mm left UVJ stone.  Underwent successful left ESWL on 04/14/2019.    Underwent right ESWL on 05/05/2019.     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    Hypothyroidism    Kidney stone    Thyroid goiter     Surgical History: Past Surgical History:  Procedure Laterality Date   EXTRACORPOREAL SHOCK WAVE LITHOTRIPSY Left 04/14/2019   Procedure: EXTRACORPOREAL SHOCK WAVE LITHOTRIPSY (ESWL);  Surgeon: Riki AltesStoioff, Scott C, MD;  Location: [facility] OR[facility];  Service: Urology;  Laterality: Left;   EXTRACORPOREAL SHOCK WAVE LITHOTRIPSY Right 05/05/2019   Procedure: EXTRACORPOREAL SHOCK WAVE LITHOTRIPSY (ESWL);  Surgeon: Riki AltesStoioff, Scott C, MD;  Location: [facility];  Service: Urology;  Laterality: Right;   LEEP N/A 05/03/2018   Procedure: LOOP ELECTROSURGICAL EXCISION PROCEDURE (LEEP);  Surgeon:  Herold Harmsefrancesco, Martin A, MD;  Location: [facility] OR[facility];  Service: Gynecology;  Laterality: N/A;   REMOVAL OF DRUG DELIVERY IMPLANT Left 12/15/2014   Procedure: REMOVAL OF DRUG DELIVERY IMPLANT;  Surgeon: Nadara Mustardobert P Harris, MD;  Location: [facility] OR[facility];  Service: Gynecology;  Laterality: Left;    Home Medications:  Allergies as of 05/10/2019      Reactions   Kiwi Extract Anaphylaxis   Amoxicillin Hives, Rash, Other (See Comments)   Blisters Has patient had a PCN reaction causing immediate rash, facial/tongue/throat swelling, SOB or lightheadedness with hypotension: No Has patient had a PCN reaction causing severe rash involving mucus membranes or skin necrosis: Yes Has patient had a PCN reaction that required hospitalization: No Has patient had a PCN reaction occurring within the last 10 years: Unknown If all of the above answers are "NO", then may proceed with Cephalosporin use.   Ciprofloxacin Hives   blisters   Penicillins Rash, Other (See Comments)   Has patient had a PCN reaction causing immediate rash, facial/tongue/throat swelling, SOB or lightheadedness with hypotension:no Has patient had a PCN reaction causing severe rash involving mucus membranes or skin necrosis: yes Has patient had a PCN reaction that required hospitalization no Has patient had a PCN reaction occurring within the last 10 years: yes If all of the above answers are "NO", then may proceed with Cephalosporin use.   Tramadol Itching   Cranberry Rash   Latex Rash   Sulfa Antibiotics Rash, Other (See Comments)   BLISTERS      Medication List  Accurate as of May 09, 2019  9:04 PM. If you have any questions, ask your nurse or doctor.        HYDROcodone-acetaminophen 5-325 MG tablet Commonly known as: NORCO/VICODIN Take 1 tablet by mouth every 4 (four) hours as needed.   ibuprofen 400 MG tablet Commonly known as: ADVIL Take 400 mg by mouth 3 (three) times daily as needed for pain.   nystatin cream Commonly  known as: MYCOSTATIN Apply 1 application topically 2 (two) times daily.   ondansetron 4 MG tablet Commonly known as: ZOFRAN Take 1 tablet (4 mg total) by mouth every 6 (six) hours as needed for nausea.   promethazine 25 MG tablet Commonly known as: PHENERGAN Take 1 tablet (25 mg total) by mouth every 6 (six) hours as needed for nausea or vomiting.   tamsulosin 0.4 MG Caps capsule Commonly known as: FLOMAX Take 1 capsule (0.4 mg total) by mouth daily.   traZODone 50 MG tablet Commonly known as: DESYREL Take 1 tablet (50 mg total) by mouth at bedtime as needed for sleep.       Allergies:  Allergies  Allergen Reactions   Kiwi Extract Anaphylaxis   Amoxicillin Hives, Rash and Other (See Comments)    Blisters Has patient had a PCN reaction causing immediate rash, facial/tongue/throat swelling, SOB or lightheadedness with hypotension: No Has patient had a PCN reaction causing severe rash involving mucus membranes or skin necrosis: Yes Has patient had a PCN reaction that required hospitalization: No Has patient had a PCN reaction occurring within the last 10 years: Unknown If all of the above answers are "NO", then may proceed with Cephalosporin use.    Ciprofloxacin Hives    blisters   Penicillins Rash and Other (See Comments)    Has patient had a PCN reaction causing immediate rash, facial/tongue/throat swelling, SOB or lightheadedness with hypotension:no Has patient had a PCN reaction causing severe rash involving mucus membranes or skin necrosis: yes Has patient had a PCN reaction that required hospitalization no Has patient had a PCN reaction occurring within the last 10 years: yes If all of the above answers are "NO", then may proceed with Cephalosporin use.    Tramadol Itching   Cranberry Rash   Latex Rash   Sulfa Antibiotics Rash and Other (See Comments)    BLISTERS    Family History: Family History  Problem Relation Age of Onset   Breast cancer Neg Hx      Ovarian cancer Neg Hx    Colon cancer Neg Hx     Social History:  reports that she has been smoking cigarettes. She has a 5.00 pack-year smoking history. She has never used smokeless tobacco. She reports current alcohol use. She reports that she does not use drugs.  ROS:                                        Physical Exam: There were no vitals taken for this visit.  Constitutional:  Well nourished. Alert and oriented, No acute distress. HEENT: [place] AT, moist mucus membranes.  Trachea midline, no masses. Cardiovascular: No clubbing, cyanosis, or edema. Respiratory: Normal respiratory effort, no increased work of breathing. GI: Abdomen is soft, non tender, non distended, no abdominal masses. Liver and spleen not palpable.  No hernias appreciated.  Stool sample for occult testing is not indicated.   GU: No CVA tenderness.  No bladder fullness  or masses.  *** external genitalia, *** pubic hair distribution, no lesions.  Normal urethral meatus, no lesions, no prolapse, no discharge.   No urethral masses, tenderness and/or tenderness. No bladder fullness, tenderness or masses. *** vagina mucosa, *** estrogen effect, no discharge, no lesions, *** pelvic support, *** cystocele and *** rectocele noted.  No cervical motion tenderness.  Uterus is freely mobile and non-fixed.  No adnexal/parametria masses or tenderness noted.  Anus and perineum are without rashes or lesions.   ***  Skin: No rashes, bruises or suspicious lesions. Lymph: No cervical or inguinal adenopathy. Neurologic: Grossly intact, no focal deficits, moving all 4 extremities. Psychiatric: Normal mood and affect.   Pertinent Imaging: ***    Assessment & Plan:    1. Right nephrolithiasis - s/p right ESWL  Michiel Cowboy, [facility] [address] [place], [place] [address] [phone]

## 2019-05-10 ENCOUNTER — Ambulatory Visit: Payer: Self-pay | Admitting: Urology

## 2019-05-10 ENCOUNTER — Other Ambulatory Visit: Payer: Self-pay

## 2019-05-10 DIAGNOSIS — G8918 Other acute postprocedural pain: Secondary | ICD-10-CM

## 2019-05-10 DIAGNOSIS — N200 Calculus of kidney: Secondary | ICD-10-CM

## 2019-05-18 ENCOUNTER — Other Ambulatory Visit: Payer: Self-pay | Admitting: Family

## 2019-05-18 NOTE — Progress Notes (Signed)
Approximately 5 minutes was spent documenting and reviewing patient's chart.   

## 2019-05-19 ENCOUNTER — Encounter: Payer: Self-pay | Admitting: Urology

## 2019-05-19 ENCOUNTER — Ambulatory Visit: Payer: Self-pay | Admitting: Urology

## 2019-06-01 ENCOUNTER — Ambulatory Visit
Admission: RE | Admit: 2019-06-01 | Discharge: 2019-06-01 | Disposition: A | Discharge status: Home / Self Care | Payer: Medicaid Other | Source: Ambulatory Visit | Attending: Urology | Admitting: Urology

## 2019-06-01 ENCOUNTER — Ambulatory Visit: Payer: Medicaid Other | Admitting: Urology

## 2019-06-01 DIAGNOSIS — G8918 Other acute postprocedural pain: Secondary | ICD-10-CM | POA: Diagnosis not present

## 2019-06-01 DIAGNOSIS — N200 Calculus of kidney: Secondary | ICD-10-CM

## 2019-06-06 ENCOUNTER — Telehealth: Payer: Self-pay

## 2019-06-06 NOTE — Telephone Encounter (Signed)
Called pt informed her of the information below. Pt gave verbal understanding.  

## 2019-06-06 NOTE — Telephone Encounter (Signed)
-----   Message from Abbie Sons, MD sent at 06/05/2019  7:13 PM EST ----- No calculi or fragments were seen on KUB.  Recommend 1 year follow-up with a KUB.

## 2019-06-29 ENCOUNTER — Encounter: Payer: Medicaid Other | Admitting: Obstetrics and Gynecology

## 2019-07-05 ENCOUNTER — Encounter: Payer: Self-pay | Admitting: Obstetrics and Gynecology

## 2019-07-05 ENCOUNTER — Other Ambulatory Visit: Payer: Self-pay

## 2019-07-05 ENCOUNTER — Other Ambulatory Visit (HOSPITAL_COMMUNITY)
Admission: RE | Admit: 2019-07-05 | Discharge: 2019-07-05 | Disposition: A | Discharge status: Home / Self Care | Payer: Medicaid Other | Source: Ambulatory Visit | Attending: Obstetrics and Gynecology | Admitting: Obstetrics and Gynecology

## 2019-07-05 ENCOUNTER — Ambulatory Visit (INDEPENDENT_AMBULATORY_CARE_PROVIDER_SITE_OTHER): Payer: Medicaid Other | Admitting: Obstetrics and Gynecology

## 2019-07-05 VITALS — BP 123/74 | HR 105 | Ht 59.0 in | Wt 205.0 lb

## 2019-07-05 DIAGNOSIS — Z975 Presence of (intrauterine) contraceptive device: Secondary | ICD-10-CM

## 2019-07-05 DIAGNOSIS — O26899 Other specified pregnancy related conditions, unspecified trimester: Secondary | ICD-10-CM | POA: Insufficient documentation

## 2019-07-05 DIAGNOSIS — N930 Postcoital and contact bleeding: Secondary | ICD-10-CM | POA: Insufficient documentation

## 2019-07-05 DIAGNOSIS — Z87410 Personal history of cervical dysplasia: Secondary | ICD-10-CM | POA: Diagnosis not present

## 2019-07-05 DIAGNOSIS — R102 Pelvic and perineal pain: Secondary | ICD-10-CM | POA: Diagnosis not present

## 2019-07-05 NOTE — Progress Notes (Signed)
    GYNECOLOGY PROGRESS NOTE  Subjective:    Patient ID: Brooke Aguirre, female    DOB: 1996/02/17, 23 y.o.   MRN: [id number]  HPI  Patient is a 23 y.o. G74P2002 female who presents for complaints of postcoital vaginal bleeding x 1 month.  She currently has a new partner (has been ~ 1 month).  Has an  Mirena IUD in place, inserted in June 2020. Also has a history of kidney stones in the past (requiring lithotripsy, last encounter 04/2019). Denies vaginal discharge. Prior history of LEEP in August 2019. Additionally complains of pain with intercourse, but this has been ongoing for some time now (~ 4 years). Currently is a patient of Dr. Amalia Hailey, seen today due to unavailabilty on her provider's schedule.   The following portions of the patient's history were reviewed and updated as appropriate: allergies, current medications, past family history, past medical history, past social history, past surgical history and problem list.  Review of Systems Pertinent items noted in HPI and remainder of comprehensive ROS otherwise negative.   Objective:   Blood pressure 123/74, pulse (!) 105, height 4\' 11"  (1.499 m), weight 205 lb (93 kg). General appearance: alert and no distress Abdomen: soft, non-tender; bowel sounds normal; no masses,  no organomegaly Pelvic: external genitalia normal, rectovaginal septum normal.  Vagina with scan amount of dark red blood in vault.  Cervix normal appearing, no lesions and no motion tenderness.  IUD threads visible, appear shortened (~ 0.5 cm visible). Uterus mobile, nontender, normal shape and size.  Adnexae non-palpable, nontender bilaterally.  Extremities: extremities normal, atraumatic, no cyanosis or edema Neurologic: Grossly normal   Assessment:   Postcoital bleeding History of cervical dysplasia IUD in place Pelvic pain  Plan:   - Postcoital bleeding, discussed differential diagnosis. Recommend cervical screen with STD screen in light of new partner  (though patient does not thinks she has had possible exposure).  Patient ok to test.  Wet prep not performed due to presence of blood, no major discharge noted. Will wait for cultures.  - Pelvic ultrasound ordered to confirm IUD placement, as well as rule out other causes for potential pain and bleeding (I.e. polyp).  - History of cervical dysplasia s/p LEEP last year. Cervix appeared normal on today's exam. Most recent pap smear was negative 3 months ago. Not a likely cause of patient's bleeding.  - Pelvic pain, patient has noted for several years.  Unsure if it was associated with IUD use as she had not had this pain until after the birth of her last child 4 years ago, however this is when she began IUD contraception use.  Discussed that it is possible that her uterus could be tilted, or did not tolerate well the use of an IUD.  If no other findings noted, may need to consider break from the IUD. If pain resolves, should consider use of another contraceptive. If pain does not resolve, then can resume IUD use and continue exploration of other causes.  Currently only has occasional spotting with IUD with regards to menses and otherwise likes her IUD.   Will notify of all reuslts by phone.  Return to clinic for any scheduled appointments or if symptoms worsen or fail to improve.   Rubie Maid, MD [facility]

## 2019-07-05 NOTE — Progress Notes (Signed)
Patient c/o light to heavy vaginal bleeding during intercourse x1 month, bleeding usually last about 2-7 days.

## 2019-07-05 NOTE — Patient Instructions (Signed)
Abnormal Uterine Bleeding Abnormal uterine bleeding is unusual bleeding from the uterus. It includes:  Bleeding or spotting between periods.  Bleeding after sex.  Bleeding that is heavier than normal.  Periods that last longer than usual.  Bleeding after menopause. Abnormal uterine bleeding can affect women at various stages in life, including teenagers, women in their reproductive years, pregnant women, and women who have reached menopause. Common causes of abnormal uterine bleeding include:  Pregnancy.  Growths of tissue (polyps).  A noncancerous tumor in the uterus (fibroid).  Infection.  Cancer.  Hormonal imbalances. Any type of abnormal bleeding should be evaluated by a health care provider. Many cases are minor and simple to treat, while others are more serious. Treatment will depend on the cause of the bleeding. Follow these instructions at home:  Monitor your condition for any changes.  Do not use tampons, douche, or have sex if told by your health care provider.  Change your pads often.  Get regular exams that include pelvic exams and cervical cancer screening.  Keep all follow-up visits as told by your health care provider. This is important. Contact a health care provider if:  Your bleeding lasts for more than one week.  You feel dizzy at times.  You feel nauseous or you vomit. Get help right away if:  You pass out.  Your bleeding soaks through a pad every hour.  You have abdominal pain.  You have a fever.  You become sweaty or weak.  You pass large blood clots from your vagina. Summary  Abnormal uterine bleeding is unusual bleeding from the uterus.  Any type of abnormal bleeding should be evaluated by a health care provider. Many cases are minor and simple to treat, while others are more serious.  Treatment will depend on the cause of the bleeding. This information is not intended to replace advice given to you by your health care provider.  Make sure you discuss any questions you have with your health care provider. Document Released: 06/30/2005 Document Revised: 10/07/2017 Document Reviewed: 08/01/2016 Elsevier Patient Education  2020 Elsevier Inc.  

## 2019-07-12 ENCOUNTER — Other Ambulatory Visit: Payer: Self-pay

## 2019-07-12 ENCOUNTER — Ambulatory Visit (INDEPENDENT_AMBULATORY_CARE_PROVIDER_SITE_OTHER): Payer: Medicaid Other

## 2019-07-12 DIAGNOSIS — Z975 Presence of (intrauterine) contraceptive device: Secondary | ICD-10-CM

## 2019-07-12 DIAGNOSIS — R102 Pelvic and perineal pain: Secondary | ICD-10-CM

## 2019-07-12 DIAGNOSIS — N930 Postcoital and contact bleeding: Secondary | ICD-10-CM

## 2019-07-12 LAB — CERVICOVAGINAL ANCILLARY ONLY
Bacterial Vaginitis (gardnerella): POSITIVE — AB
Candida Glabrata: NEGATIVE
Candida Vaginitis: POSITIVE — AB
Chlamydia: NEGATIVE
Comment: NEGATIVE
Comment: NEGATIVE
Comment: NEGATIVE
Comment: NEGATIVE
Comment: NEGATIVE
Comment: NORMAL
Neisseria Gonorrhea: NEGATIVE
Trichomonas: NEGATIVE

## 2019-07-13 ENCOUNTER — Telehealth: Payer: Self-pay | Admitting: Certified Nurse Midwife

## 2019-07-13 NOTE — Telephone Encounter (Signed)
Pt is at pharmacy pt stated that her meds was ready per a mychart comment. Please advise

## 2019-07-14 ENCOUNTER — Encounter: Payer: Self-pay | Admitting: *Deleted

## 2019-07-14 MED ORDER — METRONIDAZOLE 500 MG PO TABS: 500.0000 mg | ORAL_TABLET | Freq: Two times a day (BID) | ORAL | 0 refills | Status: DC

## 2019-07-14 MED ORDER — FLUCONAZOLE 150 MG PO TABS: 150.0000 mg | ORAL_TABLET | Freq: Once | ORAL | 0 refills | Status: AC

## 2019-07-14 NOTE — Telephone Encounter (Signed)
Pt is aware that her medication has been sent to the pharmacy.  

## 2019-07-25 ENCOUNTER — Telehealth: Payer: Medicaid Other | Admitting: Physician Assistant

## 2019-07-25 DIAGNOSIS — Z1152 Encounter for screening for COVID-19: Secondary | ICD-10-CM

## 2019-07-25 DIAGNOSIS — Z20822 Contact with and (suspected) exposure to covid-19: Secondary | ICD-10-CM

## 2019-07-25 DIAGNOSIS — R059 Cough, unspecified: Secondary | ICD-10-CM

## 2019-07-25 DIAGNOSIS — R05 Cough: Secondary | ICD-10-CM

## 2019-07-25 MED ORDER — BENZONATATE 100 MG PO CAPS: 100.0000 mg | ORAL_CAPSULE | Freq: Three times a day (TID) | ORAL | 0 refills | Status: DC | PRN

## 2019-07-25 MED ORDER — PROMETHAZINE-DM 6.25-15 MG/5ML PO SYRP: 5.0000 mL | ORAL_SOLUTION | Freq: Four times a day (QID) | ORAL | 0 refills | Status: DC | PRN

## 2019-07-25 NOTE — Progress Notes (Signed)
E-Visit for Corona Virus Screening   Your current symptoms could be consistent with the coronavirus.    [facility] has multiple testing sites. For information on our [place] testing locations and hours go to [email or web address]  We are enrolling you in our [place] for [place] . Daily you will receive a questionnaire within the [place] website. Our COVID 19 response team will be monitoring your responses daily.  Testing Information: The COVID-19 Community Testing sites will begin testing BY APPOINTMENT ONLY.  You can schedule online at [email or web address]  If you do not have access to a smart phone or computer you may call [phone] for an appointment.  Testing Locations: Appointment schedule is 8 am to 3:30 pm at all sites  [facility] indoors at [address], [place] [address] [facility]  indoors at [place]. [address], [place], [place] [address] [place] indoors at [address] [place] [address]  Additional testing sites in the Community:  . For [facility] Testing sites in [facility]  FaceUpdate.uy  . For Pop-up testing sites in [place]  BowlDirectory.co.uk  . For Testing sites with regular hours https://onsms.org/[email or web address]  . For [place] [email or web address].si  . For [facility] BasicJet.ca  . For [facility] testing in [place] and [employer] BasicJet.[email or web address]  . For [facility] testing in [facility]   https://lhi.care/covidtesting  For  more information about community testing call [phone]   We are enrolling you in our  [place] for [place] . Daily you will receive a questionnaire within the [place] website. Our COVID 19 response team will be monitoring your responses daily.  Please quarantine yourself while awaiting your test results. If you develop fever/cough/breathlessness, please stay home for 10 days with improving symptoms and until you have had 24 hours of no fever (without taking a fever reducer).  You should wear a mask or cloth face covering over your nose and mouth if you must be around other people or animals, including pets (even at home). Try to stay at least 6 feet away from other people. This will protect the people around you.  Please continue good preventive care measures, including:  frequent hand-washing, avoid touching your face, cover coughs/sneezes, stay out of crowds and keep a 6 foot distance from others.  COVID-19 is a respiratory illness with symptoms that are similar to the flu. Symptoms are typically mild to moderate, but there have been cases of severe illness and death due to the virus.   The following symptoms may appear 2-14 days after exposure: . Fever . Cough . Shortness of breath or difficulty breathing . Chills . Repeated shaking with chills . Muscle pain . Headache . Sore throat . New loss of taste or smell . Fatigue . Congestion or runny nose . Nausea or vomiting . Diarrhea  Go to the nearest hospital ED for assessment if fever/cough/breathlessness are severe or illness seems like a threat to life.  It is vitally important that if you feel that you have an infection such as this virus or any other virus that you stay home and away from places where you may spread it to others.  You should avoid contact with people age 20 and older.   You can use medication such as A prescription cough medication called Tessalon Perles 100 mg. You may take 1-2 capsules every 8 hours as needed for cough and A prescription cough medication called Phenergan DM 6.25 mg/15 mg.  You make take one teaspoon / 5 ml every 4-6 hours as needed for cough  You may also take acetaminophen (Tylenol) as needed for fever.  Reduce your risk of any infection by using the same precautions used for avoiding the common cold or flu:  [place] Kitchen Wash your hands often with soap and warm water for at least 20 seconds.  If soap and water are not readily available, use an alcohol-based hand sanitizer with at least 60% alcohol.  . If coughing or sneezing, cover your mouth and nose by coughing or sneezing into the elbow areas of your shirt or coat, into a tissue or into your sleeve (not your hands). . Avoid shaking hands with others and consider head nods or verbal greetings only. . Avoid touching your eyes, nose, or mouth with unwashed hands.  . Avoid close contact with people who are sick. . Avoid places or events with large numbers of people in one location, like concerts or sporting events. . Carefully consider travel plans you have or are making. . If you are planning any travel outside or inside the [place], visit the CDC's Travelers' Health webpage for the latest health notices. . If you have some symptoms but not all symptoms, continue to monitor at home and seek medical attention if your symptoms worsen. . If you are having a medical emergency, call 911.  HOME CARE . Only take medications as instructed by your medical team. . Drink plenty of fluids and get plenty of rest. . A steam or ultrasonic humidifier can help if you have congestion.   GET HELP RIGHT AWAY IF YOU HAVE EMERGENCY WARNING SIGNS** FOR COVID-19. If you or someone is showing any of these signs seek emergency medical care immediately. Call 911 or proceed to your closest emergency facility if: . You develop worsening high fever. . Trouble breathing . Bluish lips or face . Persistent pain or pressure in the chest . New confusion . Inability to wake or stay awake . You cough up blood. . Your symptoms become more severe  **This  list is not all possible symptoms. Contact your medical provider for any symptoms that are sever or concerning to you.  MAKE SURE YOU   Understand these instructions.  Will watch your condition.  Will get help right away if you are not doing well or get worse.  Your e-visit answers were reviewed by a board certified advanced clinical practitioner to complete your personal care plan.  Depending on the condition, your plan could have included both over the counter or prescription medications.  If there is a problem please reply once you have received a response from your provider.  Your safety is important to [place].  If you have drug allergies check your prescription carefully.    You can use MyChart to ask questions about today's visit, request a non-urgent call back, or ask for a work or school excuse for 24 hours related to this e-Visit. If it has been greater than 24 hours you will need to follow up with your provider, or enter a new e-Visit to address those concerns. You will get an e-mail in the next two days asking about your experience.  I hope that your e-visit has been valuable and will speed your recovery. Thank you for using e-visits.   Greater than 5 minutes, yet less than 10 minutes of time have been spent researching, coordinating and implementing care for this patient today.

## 2019-07-25 NOTE — Addendum Note (Signed)
Addended by: Sebastian Ache on: 07/25/2019 01:43 PM   Modules accepted: Orders

## 2019-07-26 ENCOUNTER — Ambulatory Visit: Payer: Medicaid Other | Attending: Internal Medicine

## 2019-07-26 DIAGNOSIS — Z20822 Contact with and (suspected) exposure to covid-19: Secondary | ICD-10-CM | POA: Diagnosis not present

## 2019-07-28 LAB — NOVEL CORONAVIRUS, NAA: SARS-CoV-2, NAA: NOT DETECTED

## 2019-08-17 DIAGNOSIS — J45998 Other asthma: Secondary | ICD-10-CM | POA: Diagnosis not present

## 2019-08-17 DIAGNOSIS — F419 Anxiety disorder, unspecified: Secondary | ICD-10-CM | POA: Diagnosis not present

## 2019-08-17 DIAGNOSIS — Z716 Tobacco abuse counseling: Secondary | ICD-10-CM | POA: Diagnosis not present

## 2019-08-17 DIAGNOSIS — L0291 Cutaneous abscess, unspecified: Secondary | ICD-10-CM | POA: Diagnosis not present

## 2019-08-17 DIAGNOSIS — Z1331 Encounter for screening for depression: Secondary | ICD-10-CM | POA: Diagnosis not present

## 2019-08-17 DIAGNOSIS — E785 Hyperlipidemia, unspecified: Secondary | ICD-10-CM | POA: Diagnosis not present

## 2019-09-01 ENCOUNTER — Telehealth: Payer: Medicaid Other | Admitting: Physician Assistant

## 2019-09-01 DIAGNOSIS — L0291 Cutaneous abscess, unspecified: Secondary | ICD-10-CM

## 2019-09-01 NOTE — Progress Notes (Signed)
Based on what you shared with me, I feel your condition warrants further evaluation and I recommend that you be seen for a face to face visit.  Please contact your primary care physician practice to be seen. Many offices offer virtual options to be seen via video if you are not comfortable going in person to a medical facility at this time.  If you do not have a PCP, [facility] offers a free physician referral service available at [phone]. Our trained staff has the experience, knowledge and resources to put you in touch with a physician who is right for you.   You also have the option of a video visit through [email or web address]  If you are having a true medical emergency please call 911.  NOTE: If you entered your credit card information for this eVisit, you will not be charged. You may see a "hold" on your card for the $35 but that hold will drop off and you will not have a charge processed.  Your e-visit answers were reviewed by a board certified advanced clinical practitioner to complete your personal care plan.  Thank you for using e-Visits.   Lilie Vezina PA-C   Approximately 5 minutes was spent documenting and reviewing patient's chart.   

## 2019-09-02 DIAGNOSIS — Z20828 Contact with and (suspected) exposure to other viral communicable diseases: Secondary | ICD-10-CM | POA: Diagnosis not present

## 2019-09-26 DIAGNOSIS — Z20828 Contact with and (suspected) exposure to other viral communicable diseases: Secondary | ICD-10-CM | POA: Diagnosis not present

## 2019-10-10 ENCOUNTER — Encounter: Payer: Self-pay | Admitting: Emergency Medicine

## 2019-10-10 DIAGNOSIS — Z91040 Latex allergy status: Secondary | ICD-10-CM | POA: Diagnosis not present

## 2019-10-10 DIAGNOSIS — S0990XA Unspecified injury of head, initial encounter: Secondary | ICD-10-CM | POA: Diagnosis not present

## 2019-10-10 DIAGNOSIS — Y92410 Unspecified street and highway as the place of occurrence of the external cause: Secondary | ICD-10-CM | POA: Insufficient documentation

## 2019-10-10 DIAGNOSIS — F17210 Nicotine dependence, cigarettes, uncomplicated: Secondary | ICD-10-CM | POA: Diagnosis not present

## 2019-10-10 DIAGNOSIS — R519 Headache, unspecified: Secondary | ICD-10-CM | POA: Diagnosis not present

## 2019-10-10 DIAGNOSIS — Y9389 Activity, other specified: Secondary | ICD-10-CM | POA: Diagnosis not present

## 2019-10-10 DIAGNOSIS — Z79899 Other long term (current) drug therapy: Secondary | ICD-10-CM | POA: Insufficient documentation

## 2019-10-10 DIAGNOSIS — E039 Hypothyroidism, unspecified: Secondary | ICD-10-CM | POA: Insufficient documentation

## 2019-10-10 DIAGNOSIS — Y999 Unspecified external cause status: Secondary | ICD-10-CM | POA: Diagnosis not present
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
## 2019-10-11 ENCOUNTER — Emergency Department
Admission: EM | Admit: 2019-10-11 | Discharge: 2019-10-11 | Disposition: A | Discharge status: Home / Self Care | Payer: No Typology Code available for payment source | Attending: Emergency Medicine | Admitting: Emergency Medicine

## 2019-10-11 ENCOUNTER — Emergency Department: Payer: No Typology Code available for payment source

## 2019-10-11 DIAGNOSIS — S0990XA Unspecified injury of head, initial encounter: Secondary | ICD-10-CM | POA: Diagnosis not present

## 2019-10-11 DIAGNOSIS — R519 Headache, unspecified: Secondary | ICD-10-CM | POA: Diagnosis not present

## 2019-10-11 NOTE — Discharge Instructions (Signed)
You may take Tylenol and/or Ibuprofen as needed for headache.  Return to the ER for worsening symptoms, persistent vomiting, lethargy or other concerns.
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
## 2019-11-09 DIAGNOSIS — Z03818 Encounter for observation for suspected exposure to other biological agents ruled out: Secondary | ICD-10-CM | POA: Diagnosis not present

## 2019-11-09 DIAGNOSIS — Z20828 Contact with and (suspected) exposure to other viral communicable diseases: Secondary | ICD-10-CM | POA: Diagnosis not present

## 2019-11-10 DIAGNOSIS — J029 Acute pharyngitis, unspecified: Secondary | ICD-10-CM | POA: Diagnosis not present

## 2019-11-10 DIAGNOSIS — M791 Myalgia, unspecified site: Secondary | ICD-10-CM | POA: Diagnosis not present

## 2019-11-10 DIAGNOSIS — B349 Viral infection, unspecified: Secondary | ICD-10-CM | POA: Diagnosis not present

## 2019-11-13 DIAGNOSIS — R0981 Nasal congestion: Secondary | ICD-10-CM | POA: Diagnosis not present

## 2019-11-13 DIAGNOSIS — Z1152 Encounter for screening for COVID-19: Secondary | ICD-10-CM | POA: Diagnosis not present

## 2019-11-13 DIAGNOSIS — J029 Acute pharyngitis, unspecified: Secondary | ICD-10-CM | POA: Diagnosis not present

## 2019-11-13 DIAGNOSIS — R05 Cough: Secondary | ICD-10-CM | POA: Diagnosis not present

## 2019-11-14 ENCOUNTER — Telehealth: Payer: Medicaid Other | Admitting: Physician Assistant

## 2019-11-14 DIAGNOSIS — J019 Acute sinusitis, unspecified: Secondary | ICD-10-CM

## 2019-11-14 MED ORDER — DOXYCYCLINE HYCLATE 100 MG PO CAPS: 100.0000 mg | ORAL_CAPSULE | Freq: Two times a day (BID) | ORAL | 0 refills | Status: DC

## 2019-11-14 NOTE — Progress Notes (Signed)

## 2019-12-02 ENCOUNTER — Telehealth: Payer: Self-pay | Admitting: Obstetrics and Gynecology

## 2019-12-02 NOTE — Telephone Encounter (Signed)
Error

## 2019-12-07 ENCOUNTER — Ambulatory Visit (INDEPENDENT_AMBULATORY_CARE_PROVIDER_SITE_OTHER): Payer: Medicaid Other | Admitting: Obstetrics and Gynecology

## 2019-12-07 ENCOUNTER — Encounter: Payer: Self-pay | Admitting: Obstetrics and Gynecology

## 2019-12-07 ENCOUNTER — Other Ambulatory Visit: Payer: Self-pay

## 2019-12-07 VITALS — BP 100/69 | HR 96 | Ht 59.0 in | Wt 210.8 lb

## 2019-12-07 DIAGNOSIS — Z205 Contact with and (suspected) exposure to viral hepatitis: Secondary | ICD-10-CM | POA: Diagnosis not present

## 2019-12-07 NOTE — Progress Notes (Signed)
HPI:      [name] is a 24 y.o. [phone] who LMP was No LMP recorded (lmp unknown). (Menstrual status: IUD).  Subjective:   She presents today stating that both her father and mother have hepatitis C and that she believes that working with him and caring for them has increased her risk for hepatitis C.  Her mother has been diagnosed for more than 15 years and her father was just recently diagnosed with hepatitis C without symptoms.  She is concerned she could have hepatitis without symptoms and would like to be tested.    Hx: The following portions of the patient's history were reviewed and updated as appropriate:             She  has a past medical history of Abnormal Pap smear of cervix, Chronic kidney disease, Family history of adverse reaction to anesthesia, Heart murmur, History of lithotripsy, HPV in female, Hypothyroidism, Kidney stone, and Thyroid goiter. She does not have any pertinent problems on file. She  has a past surgical history that includes Removal of drug delivery implant (Left, 12/15/2014); LEEP (N/A, 05/03/2018); Extracorporeal shock wave lithotripsy (Left, 04/14/2019); and Extracorporeal shock wave lithotripsy (Right, 05/05/2019). Her family history includes Hepatitis C in her father and mother. She  reports that she has been smoking cigarettes. She has a 5.00 pack-year smoking history. She has never used smokeless tobacco. She reports current alcohol use. She reports that she does not use drugs. She has a current medication list which includes the following prescription(s): levonorgestrel. She is allergic to kiwi extract; amoxicillin; ciprofloxacin; penicillins; tramadol; cranberry; latex; and sulfa antibiotics.       Review of Systems:  Review of Systems  Constitutional: Denied constitutional symptoms, night sweats, recent illness, fatigue, fever, insomnia and weight loss.  Eyes: Denied eye symptoms, eye pain, photophobia, vision change and visual disturbance.   Ears/Nose/Throat/Neck: Denied ear, nose, throat or neck symptoms, hearing loss, nasal discharge, sinus congestion and sore throat.  Cardiovascular: Denied cardiovascular symptoms, arrhythmia, chest pain/pressure, edema, exercise intolerance, orthopnea and palpitations.  Respiratory: Denied pulmonary symptoms, asthma, pleuritic pain, productive sputum, cough, dyspnea and wheezing.  Gastrointestinal: Denied, gastro-esophageal reflux, melena, nausea and vomiting.  Genitourinary: Denied genitourinary symptoms including symptomatic vaginal discharge, pelvic relaxation issues, and urinary complaints.  Musculoskeletal: Denied musculoskeletal symptoms, stiffness, swelling, muscle weakness and myalgia.  Dermatologic: Denied dermatology symptoms, rash and scar.  Neurologic: Denied neurology symptoms, dizziness, headache, neck pain and syncope.  Psychiatric: Denied psychiatric symptoms, anxiety and depression.  Endocrine: Denied endocrine symptoms including hot flashes and night sweats.   Meds:   Current Outpatient Medications on File Prior to Visit  Medication Sig Dispense Refill  . levonorgestrel (MIRENA) 20 MCG/24HR IUD 1 each by Intrauterine route once.     No current facility-administered medications on file prior to visit.    Objective:     Vitals:   12/07/19 1507  BP: 100/69  Pulse: 96                Assessment:    G2P2002 Patient Active Problem List   Diagnosis Date Noted  . Left nephrolithiasis 03/30/2019  . Nephrolithiasis 03/29/2019  . Status post LEEP (loop electrosurgical excision procedure) of cervix 05/03/2018  . 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 Tobacco user 02/25/2018  . Obesity (BMI 30.0-34.9) 02/25/2018  . Scar 06/24/2013  . Heart murmur 05/03/2012  . URI, acute 05/03/2012  . Pyelonephritis 12/02/2011     1. Exposure  to viral hepatitis        Plan:            1.  Patient desires hepatitis C testing.-Ordered   We have discussed  hepatitis signs and symptoms as well as the types of blood exposure that is required to transmit hepatitis C.  Possible treatment options and management discussed.  All questions answered. Orders Orders Placed This Encounter  Procedures  . Hepatitis C Antibody    No orders of the defined types were placed in this encounter.     F/U  No follow-ups on file. I spent 21  minutes involved in the care of this patient preparing to see the patient by obtaining and reviewing her medical history (including labs, imaging tests and prior procedures), documenting clinical information in the electronic health record (EHR), counseling and coordinating care plans, writing and sending prescriptions, ordering tests or procedures and directly communicating with the patient by discussing pertinent items from her history and physical exam as well as detailing my assessment and plan as noted above so that she has an informed understanding.  All of her questions were answered.  Elonda Husky, M.D. 12/07/2019 3:22 PM

## 2019-12-08 LAB — HEPATITIS C ANTIBODY: Hep C Virus Ab: <0.1 s/co ratio (ref 0.0–0.9)

## 2019-12-15 ENCOUNTER — Telehealth: Payer: Self-pay | Admitting: Obstetrics and Gynecology

## 2019-12-15 NOTE — Telephone Encounter (Signed)
Spoke with patient and let her know that her test results was negative.

## 2019-12-15 NOTE — Telephone Encounter (Signed)
Pt called in and stated she got her labs and the pt has some questions. The pt is requesting a call back. Please advise

## 2020-01-08 ENCOUNTER — Telehealth: Payer: Medicaid Other | Admitting: Physician Assistant

## 2020-01-08 DIAGNOSIS — J40 Bronchitis, not specified as acute or chronic: Secondary | ICD-10-CM

## 2020-01-08 DIAGNOSIS — R059 Cough, unspecified: Secondary | ICD-10-CM

## 2020-01-08 DIAGNOSIS — R05 Cough: Secondary | ICD-10-CM

## 2020-01-08 MED ORDER — ALBUTEROL SULFATE HFA 108 (90 BASE) MCG/ACT IN AERS: 2.0000 | INHALATION_SPRAY | RESPIRATORY_TRACT | 0 refills | Status: DC | PRN

## 2020-01-08 MED ORDER — MUCINEX DM MAXIMUM STRENGTH 60-1200 MG PO TB12: 1.0000 | ORAL_TABLET | Freq: Two times a day (BID) | ORAL | 0 refills | Status: AC

## 2020-01-08 MED ORDER — BENZONATATE 100 MG PO CAPS: 100.0000 mg | ORAL_CAPSULE | Freq: Three times a day (TID) | ORAL | 0 refills | Status: DC | PRN

## 2020-01-08 NOTE — Progress Notes (Signed)
We are sorry that you are not feeling well.  Here is how we plan to help!  Based on your presentation I believe you most likely have A cough due to a virus.  This is called viral bronchitis and is best treated by rest, plenty of fluids and control of the cough.  You may use Ibuprofen or Tylenol as directed to help your symptoms.     In addition you may use A non-prescription cough medication called Mucinex DM: take 2 tablets every 12 hours. and A prescription cough medication called Tessalon Perles 100mg . You may take 1-2 capsules every 8 hours as needed for your cough. I have prescribed Albuterol inhailer for your wheezing. Use this every 4-6 hours as needed for wheezing  Stay well hydrated.  Stop smoking as this will worsen your symptoms.   From your responses in the eVisit questionnaire you describe inflammation in the upper respiratory tract which is causing a significant cough.  This is commonly called Bronchitis and has four common causes:    Allergies  Viral Infections  Acid Reflux  Bacterial Infection Allergies, viruses and acid reflux are treated by controlling symptoms or eliminating the cause. An example might be a cough caused by taking certain blood pressure medications. You stop the cough by changing the medication. Another example might be a cough caused by acid reflux. Controlling the reflux helps control the cough.  USE OF BRONCHODILATOR ("RESCUE") INHALERS: There is a risk from using your bronchodilator too frequently.  The risk is that over-reliance on a medication which only relaxes the muscles surrounding the breathing tubes can reduce the effectiveness of medications prescribed to reduce swelling and congestion of the tubes themselves.  Although you feel brief relief from the bronchodilator inhaler, your asthma may actually be worsening with the tubes becoming more swollen and filled with mucus.  This can delay other crucial treatments, such as oral steroid medications. If  you need to use a bronchodilator inhaler daily, several times per day, you should discuss this with your provider.  There are probably better treatments that could be used to keep your asthma under control.     HOME CARE . Only take medications as instructed by your medical team. . Complete the entire course of an antibiotic. . Drink plenty of fluids and get plenty of rest. . Avoid close contacts especially the very young and the elderly . Cover your mouth if you cough or cough into your sleeve. . Always remember to wash your hands . A steam or ultrasonic humidifier can help congestion.   GET HELP RIGHT AWAY IF: . You develop worsening fever. . You become short of breath . You cough up blood. . Your symptoms persist after you have completed your treatment plan MAKE SURE YOU   Understand these instructions.  Will watch your condition.  Will get help right away if you are not doing well or get worse.  Your e-visit answers were reviewed by a board certified advanced clinical practitioner to complete your personal care plan.  Depending on the condition, your plan could have included both over the counter or prescription medications. If there is a problem please reply  once you have received a response from your provider. Your safety is important to .  If you have drug allergies check your prescription carefully.    You can use MyChart to ask questions about today's visit, request a non-urgent call back, or ask for a work or school excuse for 24 hours related to  this e-Visit. If it has been greater than 24 hours you will need to follow up with your provider, or enter a new e-Visit to address those concerns. You will get an e-mail in the next two days asking about your experience.  I hope that your e-visit has been valuable and will speed your recovery. Thank you for using e-visits.  Greater than 5 minutes, yet less than 10 minutes of time have been spent researching, coordinating and  implementing care for this patient today.

## 2020-01-12 DIAGNOSIS — Z419 Encounter for procedure for purposes other than remedying health state, unspecified: Secondary | ICD-10-CM | POA: Diagnosis not present

## 2020-01-16 ENCOUNTER — Telehealth: Payer: Medicaid Other | Admitting: Physician Assistant

## 2020-01-16 DIAGNOSIS — B3731 Acute candidiasis of vulva and vagina: Secondary | ICD-10-CM

## 2020-01-16 DIAGNOSIS — B373 Candidiasis of vulva and vagina: Secondary | ICD-10-CM

## 2020-01-16 MED ORDER — MICONAZOLE NITRATE 2 % VA CREA: 1.0000 | TOPICAL_CREAM | Freq: Every day | VAGINAL | Status: DC

## 2020-01-16 NOTE — Progress Notes (Signed)
We are sorry that you are not feeling well. Here is how we plan to help! Based on what you shared with me it looks like you: May have a yeast vaginosis  Vaginosis is an inflammation of the vagina that can result in discharge, itching and pain. The cause is usually a change in the normal balance of vaginal bacteria or an infection. Vaginosis can also result from reduced estrogen levels after menopause.  The most common causes of vaginosis are:   Bacterial vaginosis which results from an overgrowth of one on several organisms that are normally present in your vagina.   Yeast infections which are caused by a naturally occurring fungus called candida.   Vaginal atrophy (atrophic vaginosis) which results from the thinning of the vagina from reduced estrogen levels after menopause.   Trichomoniasis which is caused by a parasite and is commonly transmitted by sexual intercourse.  Factors that increase your risk of developing vaginosis include: Marland Kitchen Medications, such as antibiotics and steroids . Uncontrolled diabetes . Use of hygiene products such as bubble bath, vaginal spray or vaginal deodorant . Douching . Wearing damp or tight-fitting clothing . Using an intrauterine device (IUD) for birth control . Hormonal changes, such as those associated with pregnancy, birth control pills or menopause . Sexual activity . Having a sexually transmitted infection  Your treatment plan is Monistat (miconazole) or Gyne-Lotrimin (clotrimazole) over the counter at most pharmacies.  Be sure to take all of the medication as directed. Stop taking any medication if you develop a rash, tongue swelling or shortness of breath. Mothers who are breast feeding should consider pumping and discarding their breast milk while on these antibiotics. However, there is no consensus that infant exposure at these doses would be harmful.  Remember that medication creams can weaken latex condoms. Marland Kitchen   HOME CARE:  Good hygiene  may prevent some types of vaginosis from recurring and may relieve some symptoms:  . Avoid baths, hot tubs and whirlpool spas. Rinse soap from your outer genital area after a shower, and dry the area well to prevent irritation. Don't use scented or harsh soaps, such as those with deodorant or antibacterial action. Marland Kitchen Avoid irritants. These include scented tampons and pads. . Wipe from front to back after using the toilet. Doing so avoids spreading fecal bacteria to your vagina.  Other things that may help prevent vaginosis include:  Marland Kitchen Don't douche. Your vagina doesn't require cleansing other than normal bathing. Repetitive douching disrupts the normal organisms that reside in the vagina and can actually increase your risk of vaginal infection. Douching won't clear up a vaginal infection. . Use a latex condom. Both female and female latex condoms may help you avoid infections spread by sexual contact. . Wear cotton underwear. Also wear pantyhose with a cotton crotch. If you feel comfortable without it, skip wearing underwear to bed. Yeast thrives in [employer] Your symptoms should improve in the next day or two.  GET HELP RIGHT AWAY IF:  . You have pain in your lower abdomen ( pelvic area or over your ovaries) . You develop nausea or vomiting . You develop a fever . Your discharge changes or worsens . You have persistent pain with intercourse . You develop shortness of breath, a rapid pulse, or you faint.  These symptoms could be signs of problems or infections that need to be evaluated by a medical provider now.  MAKE SURE YOU    Understand these instructions.  Will watch your condition.  Will get help right away if you are not doing well or get worse.  Your e-visit answers were reviewed by a board certified advanced clinical practitioner to complete your personal care plan. Depending upon the condition, your plan could have included both over the counter or prescription  medications. Please review your pharmacy choice to make sure that you have choses a pharmacy that is open for you to pick up any needed prescription, Your safety is important to [place]. If you have drug allergies check your prescription carefully.   You can use MyChart to ask questions about today's visit, request a non-urgent call back, or ask for a work or school excuse for 24 hours related to this e-Visit. If it has been greater than 24 hours you will need to follow up with your provider, or enter a new e-Visit to address those concerns. You will get a MyChart message within the next two days asking about your experience. I hope that your e-visit has been valuable and will speed your recovery.   5 minutes on chart

## 2020-01-27 ENCOUNTER — Telehealth: Payer: Medicaid Other | Admitting: Emergency Medicine

## 2020-01-27 DIAGNOSIS — H9201 Otalgia, right ear: Secondary | ICD-10-CM

## 2020-01-27 MED ORDER — HYDROCORTISONE-ACETIC ACID 1-2 % OT SOLN: 4.0000 [drp] | Freq: Four times a day (QID) | OTIC | 0 refills | Status: DC

## 2020-01-27 NOTE — Progress Notes (Signed)
Time spent: 10 min  E Visit for Swimmer's Ear  We are sorry that you are not feeling well. Here is how we plan to help!  Based on what you have shared with me it looks like you have swimmers ear. Swimmer's ear is a redness or swelling, irritation, or infection of your outer ear canal.  These symptoms usually occur within a few days of swimming.  Your ear canal is a tube that goes from the opening of the ear to the eardrum.  When water stays in your ear canal, germs can grow.  This is a painful condition that often happens to children and swimmers of all ages.  It is not contagious and oral antibiotics are not required to treat uncomplicated swimmer's ear.  The usual symptoms include: Itching inside the ear, Redness or a sense of swelling in the ear, Pain when the ear is tugged on when pressure is placed on the ear, Pus draining from the infected ear. and I have prescribed: Acetic acid and hydrocortisone 1% otic suspension 3 drops in affected ears twice daily for 7 days.  This antibiotic ear drop is not usually first line but you have several allergies listed including allergies to ciprofloxacin and bactrim, both which are usually first line antibiotics.   Consider using over the counter DEBROX drops during treatment to help thin out and remove any possible ear wax that can prevent medicine getting inside ear canal.  DEBROX drops can be used one or twice daily during treatment.   In certain cases swimmer's ear may progress to a more serious bacterial infection of the middle or inner ear.  If you have a fever 102 and up and significantly worsening symptoms, this could indicate a more serious infection moving to the middle/inner and needs face to face evaluation in an office by a provider.  Your symptoms should improve over the next 3 days and should resolve in about 7 days.  HOME CARE:   Wash your hands frequently.  Do not place the tip of the bottle on your ear or touch it with your fingers.  You  can take Acetominophen 650 mg every 4-6 hours as needed for pain.  If pain is severe or moderate, you can apply a heating pad (set on low) or hot water bottle (wrapped in a towel) to outer ear for 20 minutes.  This will also increase drainage.  Avoid ear plugs  Do not use Q-tips  After showers, help the water run out by tilting your head to one side.  GET HELP RIGHT AWAY IF:   Fever is over 102.2 degrees.  You develop progressive ear pain or hearing loss.  Ear symptoms persist longer than 3 days after treatment.  MAKE SURE YOU:   Understand these instructions.  Will watch your condition.  Will get help right away if you are not doing well or get worse.  TO PREVENT SWIMMER'S EAR:  Use a bathing cap or custom fitted swim molds to keep your ears dry.  Towel off after swimming to dry your ears.  Tilt your head or pull your earlobes to allow the water to escape your ear canal.  If there is still water in your ears, consider using a hairdryer on the lowest setting.  Thank you for choosing an e-visit. Your e-visit answers were reviewed by a board certified advanced clinical practitioner to complete your personal care plan. Depending upon the condition, your plan could have included both over the counter or prescription medications.  Please review your pharmacy choice. Be sure that the pharmacy you have chosen is open so that you can pick up your prescription now.  If there is a problem you may message your provider in MyChart to have the prescription routed to another pharmacy. Your safety is important to [place]. If you have drug allergies check your prescription carefully.  For the next 24 hours, you can use MyChart to ask questions about today's visit, request a non-urgent call back, or ask for a work or school excuse from your e-visit provider. You will get an email in the next two days asking about your experience. I hope that your e-visit has been valuable and will speed your  recovery.

## 2020-02-01 ENCOUNTER — Other Ambulatory Visit: Payer: Self-pay | Admitting: Family

## 2020-02-01 DIAGNOSIS — B349 Viral infection, unspecified: Secondary | ICD-10-CM | POA: Diagnosis not present

## 2020-02-01 DIAGNOSIS — J029 Acute pharyngitis, unspecified: Secondary | ICD-10-CM | POA: Diagnosis not present

## 2020-02-01 MED ORDER — NEOMYCIN-POLYMYXIN-HC 3.5-10000-1 OT SOLN: 3.0000 [drp] | Freq: Three times a day (TID) | OTIC | 0 refills | Status: AC

## 2020-02-03 ENCOUNTER — Telehealth: Payer: Medicaid Other | Admitting: Family

## 2020-02-03 DIAGNOSIS — J069 Acute upper respiratory infection, unspecified: Secondary | ICD-10-CM

## 2020-02-03 MED ORDER — PREDNISONE 20 MG PO TABS: 40.0000 mg | ORAL_TABLET | Freq: Every day | ORAL | 0 refills | Status: DC

## 2020-02-03 MED ORDER — PROMETHAZINE-DM 6.25-15 MG/5ML PO SYRP: 5.0000 mL | ORAL_SOLUTION | Freq: Four times a day (QID) | ORAL | 0 refills | Status: DC | PRN

## 2020-02-03 NOTE — Progress Notes (Signed)
We are sorry you are not feeling well.  Here is how we plan to help!  Based on what you have shared with me, it looks like you may have a viral upper respiratory infection.  Upper respiratory infections are caused by a large number of viruses; however, rhinovirus is the most common cause.   Symptoms vary from person to person, with common symptoms including sore throat, cough, fatigue or lack of energy and feeling of general discomfort.  A low-grade fever of up to 100.4 may present, but is often uncommon.  Symptoms vary however, and are closely related to a person's age or underlying illnesses.  The most common symptoms associated with an upper respiratory infection are nasal discharge or congestion, cough, sneezing, headache and pressure in the ears and face.  These symptoms usually persist for about 3 to 10 days, but can last up to 2 weeks.  It is important to know that upper respiratory infections do not cause serious illness or complications in most cases.    Upper respiratory infections can be transmitted from person to person, with the most common method of transmission being a person's hands.  The virus is able to live on the skin and can infect other persons for up to 2 hours after direct contact.  Also, these can be transmitted when someone coughs or sneezes; thus, it is important to cover the mouth to reduce this risk.  To keep the spread of the illness at bay, good hand hygiene is very important.  This is an infection that is most likely caused by a virus. There are no specific treatments other than to help you with the symptoms until the infection runs its course.  We are sorry you are not feeling well.  Here is how we plan to help!   For nasal congestion, you may use an oral decongestants such as Mucinex D or if you have glaucoma or high blood pressure use plain Mucinex.  Saline nasal spray or nasal drops can help and can safely be used as often as needed for congestion.   If you do not  have a history of heart disease, hypertension, diabetes or thyroid disease, prostate/bladder issues or glaucoma, you may also use Sudafed to treat nasal congestion.  It is highly recommended that you consult with a pharmacist or your primary care physician to ensure this medication is safe for you to take.     If you have a cough, you may use cough suppressants such as Delsym and Robitussin.  If you have glaucoma or high blood pressure, you can also use Coricidin HBP.   For cough I have prescribed for you Promethazine DM 4 times a day as needed ans Prednisone 40 mg once daily x 5 days  If you have a sore or scratchy throat, use a saltwater gargle-  to  teaspoon of salt dissolved in a 4-ounce to 8-ounce glass of warm water.  Gargle the solution for approximately 15-30 seconds and then spit.  It is important not to swallow the solution.  You can also use throat lozenges/cough drops and Chloraseptic spray to help with throat pain or discomfort.  Warm or cold liquids can also be helpful in relieving throat pain.  For headache, pain or general discomfort, you can use Ibuprofen or Tylenol as directed.   Some authorities believe that zinc sprays or the use of Echinacea may shorten the course of your symptoms.   HOME CARE . Only take medications as instructed by your medical  team. . Be sure to drink plenty of fluids. Water is fine as well as fruit juices, sodas and electrolyte beverages. You may want to stay away from caffeine or alcohol. If you are nauseated, try taking small sips of liquids. How do you know if you are getting enough fluid? Your urine should be a pale yellow or almost colorless. . Get rest. . Taking a steamy shower or using a humidifier may help nasal congestion and ease sore throat pain. You can place a towel over your head and breathe in the steam from hot water coming from a faucet. . Using a saline nasal spray works much the same way. . Cough drops, hard candies and sore throat  lozenges may ease your cough. . Avoid close contacts especially the very young and the elderly . Cover your mouth if you cough or sneeze . Always remember to wash your hands.   GET HELP RIGHT AWAY IF: . You develop worsening fever. . If your symptoms do not improve within 10 days . You develop yellow or green discharge from your nose over 3 days. . You have coughing fits . You develop a severe head ache or visual changes. . You develop shortness of breath, difficulty breathing or start having chest pain . Your symptoms persist after you have completed your treatment plan  MAKE SURE YOU   Understand these instructions.  Will watch your condition.  Will get help right away if you are not doing well or get worse.  Your e-visit answers were reviewed by a board certified advanced clinical practitioner to complete your personal care plan. Depending upon the condition, your plan could have included both over the counter or prescription medications. Please review your pharmacy choice. If there is a problem, you may call our nursing hot line at and have the prescription routed to another pharmacy. Your safety is important to [place]. If you have drug allergies check your prescription carefully.   You can use MyChart to ask questions about today's visit, request a non-urgent call back, or ask for a work or school excuse for 24 hours related to this e-Visit. If it has been greater than 24 hours you will need to follow up with your provider, or enter a new e-Visit to address those concerns. You will get an e-mail in the next two days asking about your experience.  I hope that your e-visit has been valuable and will speed your recovery. Thank you for using e-visits.     Greater than 5 minutes, yet less than 10 minutes of time have been spent researching, coordinating, and implementing care for this patient today.  Thank you for the details you included in the comment boxes. Those details are very  helpful in determining the best course of treatment for you and help [place] to provide the best care.

## 2020-02-12 DIAGNOSIS — Z419 Encounter for procedure for purposes other than remedying health state, unspecified: Secondary | ICD-10-CM | POA: Diagnosis not present

## 2020-02-17 ENCOUNTER — Telehealth: Payer: Medicaid Other | Admitting: Family

## 2020-02-17 DIAGNOSIS — N059 Unspecified nephritic syndrome with unspecified morphologic changes: Secondary | ICD-10-CM | POA: Diagnosis not present

## 2020-02-17 DIAGNOSIS — L509 Urticaria, unspecified: Secondary | ICD-10-CM

## 2020-02-17 DIAGNOSIS — J45998 Other asthma: Secondary | ICD-10-CM | POA: Diagnosis not present

## 2020-02-17 DIAGNOSIS — W64XXXS Exposure to other animate mechanical forces, sequela: Secondary | ICD-10-CM | POA: Diagnosis not present

## 2020-02-17 DIAGNOSIS — Z716 Tobacco abuse counseling: Secondary | ICD-10-CM | POA: Diagnosis not present

## 2020-02-17 NOTE — Progress Notes (Signed)
Based on what you shared with me, I feel your condition warrants further evaluation and I recommend that you be seen for a face to face office visit.   NOTE: If you entered your credit card information for this eVisit, you will not be charged. You may see a "hold" on your card for the $35 but that hold will drop off and you will not have a charge processed.   If you are having a true medical emergency please call 911.      For an urgent face to face visit, [facility] has five urgent care centers for your convenience:      NEW:  [facility] [place] Get Driving Directions [phone] [address] [place], [place] [address] . 10 am - 6pm Monday - Friday    [facility] ([place]) Get Driving Directions [phone] [address] [place], [place] [address] . 10 am to 8 pm Monday-Friday . 12 pm to 8 pm Saturday-Sunday     [facility] Get Driving Directions [phone] [address] [place], [place] [address] . 8 am to 8 pm Monday-Friday . 9 am to 6 pm Saturday . 11 am to 6 pm Sunday     [facility] [place] Get Driving Directions  [phone] [address] [place], [place] [address] . 8 am to 8 pm Monday-Friday . 8 am to 4 pm Saturday-Sunday   [facility] [place] Get Driving Directions [phone] [address] [place], [place] [address] . 12 pm to 6 pm Monday-Friday      Your e-visit answers were reviewed by a board certified advanced clinical practitioner to complete your personal care plan.  Thank you for using e-Visits.     

## 2020-03-14 DIAGNOSIS — Z419 Encounter for procedure for purposes other than remedying health state, unspecified: Secondary | ICD-10-CM | POA: Diagnosis not present

## 2020-03-22 ENCOUNTER — Encounter: Payer: Medicaid Other | Admitting: Obstetrics and Gynecology

## 2020-03-28 ENCOUNTER — Encounter: Payer: Self-pay | Admitting: Obstetrics and Gynecology

## 2020-04-03 ENCOUNTER — Telehealth: Payer: Medicaid Other | Admitting: Physician Assistant

## 2020-04-03 ENCOUNTER — Telehealth: Payer: Self-pay

## 2020-04-03 ENCOUNTER — Telehealth: Payer: Self-pay | Admitting: Obstetrics and Gynecology

## 2020-04-03 DIAGNOSIS — K130 Diseases of lips: Secondary | ICD-10-CM | POA: Diagnosis not present

## 2020-04-03 MED ORDER — KETOCONAZOLE 2 % EX CREA: 1.0000 "application " | TOPICAL_CREAM | Freq: Two times a day (BID) | CUTANEOUS | 0 refills | Status: AC

## 2020-04-03 NOTE — Telephone Encounter (Signed)
Pt called in and stated that she is having irregular bleeding with her iud. The pt said she has been bleeding for 13 days.  The pt was wanting to know if that was normal. I told the pt I will send a message to the nurse the pt verbally understood. I said to please allow 24 - 48 hours for a reply. Please advise

## 2020-04-03 NOTE — Telephone Encounter (Signed)
mychart message sent to patient

## 2020-04-03 NOTE — Telephone Encounter (Signed)
mychart message sent to patient in response to her IUD question.

## 2020-04-03 NOTE — Progress Notes (Signed)
I am sorry you are not feeling well! Here is how we plan to help:  Based on your photo and description it actually appears that you have Chelitis rather than cold sores.   Angular cheilitis is a condition that causes red, swollen patches in the corners of your mouth where your lips meet and make an angle. Other names for it are perleche and angular stomatitis. You can get it on one side of your mouth or on both sides at the same time.  Symptoms The main things you'll notice are irritation and soreness in the corner(s) of your mouth. One or both corners may be:  Bleeding Blistered Cracked Crusty Itchy Painful Red Scaly Swollen angular cheilitisYour lips can feel dry and uncomfortable. Sometimes your lips and mouth can feel like they're burning. You also might have a bad taste in your mouth.  If the irritation is strong, it can make it hard for you to eat. You may not get enough nutrients or you may lose weight.   Causes Saliva gets trapped and builds up in the corners of your mouth. When it dries, the skin in the area can crack. You may lick your lips often to soothe your cracked skin. The warmth and moisture in the corners of your mouth create the perfect conditions for fungus to grow and multiply -- and cause infection.  Fungal infection is the most common cause of angular cheilitis. It's usually caused by a type of yeast called Candida-- the same fungus that causes diaper rash in babies. Certain bacteria strains also can cause it.  A deficiency in riboflavin (vitamin B2) may also lead to angular cheilitis.    It is recommended that you avoid licking your lips and apply petroleum Jelly to the area of irritation 3 times daily. I will also Prescribe an antifungal cream in case this is a fungal infection    Thank you for choosing an e-visit. Your e-visit answers were reviewed by a board certified advanced clinical practitioner to complete your personal care plan. Depending upon the  condition, your plan could have included both over the counter or prescription medications.  Please review your pharmacy choice. Make sure the pharmacy is open so you can pick up prescription now. If there is a problem, you may contact your provider through [employer] and have the prescription routed to another pharmacy.  Your safety is important to [place]. If you have drug allergies check your prescription carefully.   For the next 24 hours you can use MyChart to ask questions about today's visit, request a non-urgent call back, or ask for a work or school excuse. You will get an email in the next two days asking about your experience. I hope that your e-visit has been valuable and will speed your recovery       7 minutes spent charting

## 2020-04-13 DIAGNOSIS — Z419 Encounter for procedure for purposes other than remedying health state, unspecified: Secondary | ICD-10-CM | POA: Diagnosis not present

## 2020-04-19 ENCOUNTER — Other Ambulatory Visit (HOSPITAL_COMMUNITY)
Admission: RE | Admit: 2020-04-19 | Discharge: 2020-04-19 | Disposition: A | Discharge status: Home / Self Care | Payer: Medicaid Other | Source: Ambulatory Visit | Attending: Obstetrics and Gynecology | Admitting: Obstetrics and Gynecology

## 2020-04-19 ENCOUNTER — Other Ambulatory Visit: Payer: Self-pay

## 2020-04-19 ENCOUNTER — Encounter: Payer: Self-pay | Admitting: Obstetrics and Gynecology

## 2020-04-19 ENCOUNTER — Ambulatory Visit (INDEPENDENT_AMBULATORY_CARE_PROVIDER_SITE_OTHER): Payer: Medicaid Other | Admitting: Obstetrics and Gynecology

## 2020-04-19 VITALS — BP 123/82 | HR 99 | Ht 59.0 in | Wt 212.5 lb

## 2020-04-19 DIAGNOSIS — L292 Pruritus vulvae: Secondary | ICD-10-CM | POA: Insufficient documentation

## 2020-04-19 DIAGNOSIS — Z9889 Other specified postprocedural states: Secondary | ICD-10-CM

## 2020-04-19 DIAGNOSIS — Z124 Encounter for screening for malignant neoplasm of cervix: Secondary | ICD-10-CM | POA: Diagnosis not present

## 2020-04-19 DIAGNOSIS — Z01419 Encounter for gynecological examination (general) (routine) without abnormal findings: Secondary | ICD-10-CM | POA: Diagnosis not present

## 2020-04-19 DIAGNOSIS — Z0000 Encounter for general adult medical examination without abnormal findings: Secondary | ICD-10-CM | POA: Diagnosis not present

## 2020-04-19 NOTE — Progress Notes (Signed)
HPI:      [name] is a 24 y.o. [phone] who LMP was No LMP recorded. (Menstrual status: IUD).  Subjective:   She presents today for her annual examination.  She states that she had several weeks of bleeding after receiving her Covid vaccine.  She says this is now resolved. She did an online visit for possible yeast infection but states that she still has symptoms of itching burning and occasional malodorous discharge. She denies new sexual partners. She has a remote history of LEEP for cervical dysplasia.  Her last 2 Pap smears have shown normal cytology. She has an IUD for birth control.   Hx: The following portions of the patient's history were reviewed and updated as appropriate:             She  has a past medical history of Abnormal Pap smear of cervix, Chronic kidney disease, Family history of adverse reaction to anesthesia, Heart murmur, History of lithotripsy, HPV in female, Hypothyroidism, Kidney stone, and Thyroid goiter. She does not have any pertinent problems on file. She  has a past surgical history that includes Removal of drug delivery implant (Left, 12/15/2014); LEEP (N/A, 05/03/2018); Extracorporeal shock wave lithotripsy (Left, 04/14/2019); and Extracorporeal shock wave lithotripsy (Right, 05/05/2019). Her family history includes Hepatitis C in her father and mother. She  reports that she has been smoking cigarettes. She has a 5.00 pack-year smoking history. She has never used smokeless tobacco. She reports current alcohol use. She reports that she does not use drugs. She has a current medication list which includes the following prescription(s): acetic acid-hydrocortisone, albuterol, and levonorgestrel, and the following Facility-Administered Medications: miconazole. She is allergic to kiwi extract, amoxicillin, ciprofloxacin, penicillins, tramadol, cranberry, latex, and sulfa antibiotics.       Review of Systems:  Review of Systems  Constitutional: Denied  constitutional symptoms, night sweats, recent illness, fatigue, fever, insomnia and weight loss.  Eyes: Denied eye symptoms, eye pain, photophobia, vision change and visual disturbance.  Ears/Nose/Throat/Neck: Denied ear, nose, throat or neck symptoms, hearing loss, nasal discharge, sinus congestion and sore throat.  Cardiovascular: Denied cardiovascular symptoms, arrhythmia, chest pain/pressure, edema, exercise intolerance, orthopnea and palpitations.  Respiratory: Denied pulmonary symptoms, asthma, pleuritic pain, productive sputum, cough, dyspnea and wheezing.  Gastrointestinal: Denied, gastro-esophageal reflux, melena, nausea and vomiting.  Genitourinary: See HPI for additional information.  Musculoskeletal: Denied musculoskeletal symptoms, stiffness, swelling, muscle weakness and myalgia.  Dermatologic: Denied dermatology symptoms, rash and scar.  Neurologic: Denied neurology symptoms, dizziness, headache, neck pain and syncope.  Psychiatric: Denied psychiatric symptoms, anxiety and depression.  Endocrine: Denied endocrine symptoms including hot flashes and night sweats.   Meds:   Current Outpatient Medications on File Prior to Visit  Medication Sig Dispense Refill  . acetic acid-hydrocortisone (VOSOL-HC) OTIC solution Place 4 drops into the right ear 4 (four) times daily. 10 mL 0  . albuterol (VENTOLIN HFA) 108 (90 Base) MCG/ACT inhaler Inhale 2 puffs into the lungs every 4 (four) hours as needed for wheezing or shortness of breath (cough, shortness of breath or wheezing.). 6.7 g 0  . levonorgestrel (MIRENA) 20 MCG/24HR IUD 1 each by Intrauterine route once.     Current Facility-Administered Medications on File Prior to Visit  Medication Dose Route Frequency Provider Last Rate Last Admin  . miconazole (MONISTAT 7) 2 % vaginal cream 1 Applicatorful  1 Applicatorful Vaginal QHS Arlyn Dunning, PA-C           Upstream - 04/19/20 1125  Pregnancy Intention Screening   Does the  patient want to become pregnant in the next year? No    Does the patient's partner want to become pregnant in the next year? No    Would the patient like to discuss contraceptive options today? No      Contraception Wrap Up   Current Method IUD or IUS    End Method IUD or IUS    Contraception Counseling Provided No          The pregnancy intention screening data noted above was reviewed. Potential methods of contraception were discussed. The patient elected to proceed with IUD or IUS.     Objective:     Vitals:   04/19/20 1119  BP: 123/82  Pulse: 99    Filed Weights   04/19/20 1119  Weight: 212 lb 8 oz (96.4 kg)              Physical examination General NAD, Conversant  HEENT Atraumatic; Op clear with mmm.  Normo-cephalic. Pupils reactive. Anicteric sclerae  Thyroid/Neck Smooth without nodularity or enlargement. Normal ROM.  Neck Supple.  Skin No rashes, lesions or ulceration. Normal palpated skin turgor. No nodularity.  Breasts: No masses or discharge.  Symmetric.  No axillary adenopathy.  Lungs: Clear to auscultation.No rales or wheezes. Normal Respiratory effort, no retractions.  Heart: NSR.  No murmurs or rubs appreciated. No periferal edema  Abdomen: Soft.  Non-tender.  No masses.  No HSM. No hernia  Extremities: Moves all appropriately.  Normal ROM for age. No lymphadenopathy.  Neuro: Oriented to PPT.  Normal mood. Normal affect.     Pelvic:   Vulva: Normal appearance.  No lesions.  Vagina: No lesions or abnormalities noted.  Support: Normal pelvic support.  Urethra No masses tenderness or scarring.  Meatus Normal size without lesions or prolapse.  Cervix: Normal appearance.  No lesions.  IUD strings noted at cervical os.  Anus: Normal exam.  No lesions.  Perineum: Normal exam.  No lesions.        Bimanual   Uterus: Normal size.  Non-tender.  Mobile.  AV.  Adnexae: No masses.  Non-tender to palpation.  Cul-de-sac: Negative for abnormality.       Assessment:    [id number] Patient Active Problem List   Diagnosis Date Noted  . Left nephrolithiasis 03/30/2019  . Nephrolithiasis 03/29/2019  . Status post LEEP (loop electrosurgical excision procedure) of cervix 05/03/2018  . 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 Tobacco user 02/25/2018  . Obesity (BMI 30.0-34.9) 02/25/2018  . Scar 06/24/2013  . Heart murmur 05/03/2012  . URI, acute 05/03/2012  . Pyelonephritis 12/02/2011     1. Well woman exam with routine gynecological exam   2. Vulvar itching   3. History of loop electrical excision procedure (LEEP)     Normal exam  Patient continues to experience symptoms possibly consistent with vaginitis.   Plan:            1.  Basic Screening Recommendations The basic screening recommendations for asymptomatic women were discussed with the patient during her visit.  The age-appropriate recommendations were discussed with her and the rational for the tests reviewed.  When I am informed by the patient that another primary care physician has previously obtained the age-appropriate tests and they are up-to-date, only outstanding tests are ordered and referrals given as necessary.  Abnormal results of tests will be discussed with her when all of her results are completed.  Routine preventative  health maintenance measures emphasized: Exercise/Diet/Weight control, Tobacco Warnings, Alcohol/Substance use risks and Stress Management Pap cotest performed -patient with history of LEEP 2.  Weight loss discussed at patient request 3.  New swab performed for possible vaginitis.  Orders No orders of the defined types were placed in this encounter.   No orders of the defined types were placed in this encounter.           F/U  Return in about 1 year (around 04/19/2021) for Annual Physical, We will contact her with any abnormal test results.  Elonda Husky, M.D. 04/19/2020 11:52 AM

## 2020-04-19 NOTE — Addendum Note (Signed)
Addended by: Dorian Pod on: 04/19/2020 01:50 PM   Modules accepted: Orders

## 2020-04-20 LAB — CERVICOVAGINAL ANCILLARY ONLY
Bacterial Vaginitis (gardnerella): POSITIVE — AB
Candida Glabrata: NEGATIVE
Candida Vaginitis: POSITIVE — AB
Chlamydia: NEGATIVE
Comment: NEGATIVE
Comment: NEGATIVE
Comment: NEGATIVE
Comment: NEGATIVE
Comment: NEGATIVE
Comment: NORMAL
Neisseria Gonorrhea: NEGATIVE
Trichomonas: NEGATIVE

## 2020-04-20 LAB — CYTOLOGY - PAP
Comment: NEGATIVE
Diagnosis: NEGATIVE
High risk HPV: NEGATIVE

## 2020-04-24 ENCOUNTER — Telehealth: Payer: Self-pay

## 2020-04-24 MED ORDER — METRONIDAZOLE 500 MG PO TABS: 500.0000 mg | ORAL_TABLET | Freq: Two times a day (BID) | ORAL | 0 refills | Status: DC

## 2020-04-24 MED ORDER — FLUCONAZOLE 150 MG PO TABS: 150.0000 mg | ORAL_TABLET | Freq: Every day | ORAL | 0 refills | Status: DC

## 2020-04-24 NOTE — Telephone Encounter (Signed)
Sent prescription to the pharmacy. I have notified patient.

## 2020-04-24 NOTE — Telephone Encounter (Signed)
Patient called in stating she was supposed to have two prescriptions sent to her pharmacy for BV. Patient is stating that her pharmacy hasn't received anything for her. Could you please advise?

## 2020-05-14 DIAGNOSIS — Z419 Encounter for procedure for purposes other than remedying health state, unspecified: Secondary | ICD-10-CM | POA: Diagnosis not present

## 2020-05-18 ENCOUNTER — Telehealth: Payer: Medicaid Other | Admitting: Physician Assistant

## 2020-05-18 DIAGNOSIS — J069 Acute upper respiratory infection, unspecified: Secondary | ICD-10-CM | POA: Diagnosis not present

## 2020-05-18 MED ORDER — ALBUTEROL SULFATE HFA 108 (90 BASE) MCG/ACT IN AERS: 2.0000 | INHALATION_SPRAY | RESPIRATORY_TRACT | 0 refills | Status: DC | PRN

## 2020-05-18 MED ORDER — BENZONATATE 100 MG PO CAPS: 100.0000 mg | ORAL_CAPSULE | Freq: Three times a day (TID) | ORAL | 0 refills | Status: DC | PRN

## 2020-05-18 MED ORDER — AEROCHAMBER PLUS FLO-VU MEDIUM MISC: 1.0000 | Freq: Once | 0 refills | Status: AC

## 2020-05-18 MED ORDER — DOXYCYCLINE HYCLATE 100 MG PO CAPS: 100.0000 mg | ORAL_CAPSULE | Freq: Two times a day (BID) | ORAL | 0 refills | Status: DC

## 2020-05-18 NOTE — Progress Notes (Signed)
E-Visit for Corona Virus Screening  Your current symptoms could be consistent with the coronavirus; additionally they may be from another virus.  Given that things improved and then worsened you may benefit from an antibiotic as the initial virus may have turned into a bacterial infection.  I am going to prescribe cough medication, an inhaler and doxycycline.  I am so glad that you are vaccinated against COVID!  It may be helpful to get a test to ensure you do not have a breakthrough case.  Many health care providers can now test patients at their office but not all are.  [facility] has multiple testing sites. For information on our COVID testing locations and hours go to [email or web address]  We are enrolling you in our MyChart Home Monitoring for COVID19 . Daily you will receive a questionnaire within the MyChart website. Our COVID 19 response team will be monitoring your responses daily.  Testing Information: The COVID-19 Community Testing sites are testing BY APPOINTMENT ONLY.  You can schedule online at [email or web address]  If you do not have access to a smart phone or computer you may call [phone] for an appointment.   Additional testing sites in the Community:  . For [facility] Testing sites in [facility]  [email or web address]  . For Pop-up testing sites in [place]  [email or web address]  . For [facility] [email or web address]  . For [facility] testing in [place] and [employer] [email or web address]  . For [facility] testing in [facility]   [email or web address]  For  more information about community testing call [phone]   Please  quarantine yourself while awaiting your test results. Please stay home for a minimum of 10 days from the first day of illness with improving symptoms and you have had 24 hours of no fever (without the use of Tylenol (Acetaminophen) Motrin (Ibuprofen) or any fever reducing medication).  Also - Do not get tested prior to returning to work because once you have had a positive test the test can stay positive for more than a month in some cases.   You should wear a mask or cloth face covering over your nose and mouth if you must be around other people or animals, including pets (even at home). Try to stay at least 6 feet away from other people. This will protect the people around you.  Please continue good preventive care measures, including:  frequent hand-washing, avoid touching your face, cover coughs/sneezes, stay out of crowds and keep a 6 foot distance from others.  COVID-19 is a respiratory illness with symptoms that are similar to the flu. Symptoms are typically mild to moderate, but there have been cases of severe illness and death due to the virus.   The following symptoms may appear 2-14 days after exposure: . Fever . Cough . Shortness of breath or difficulty breathing . Chills . Repeated shaking with chills . Muscle pain . Headache . Sore throat . New loss of taste or smell . Fatigue . Congestion or runny nose . Nausea or vomiting . Diarrhea  Go to the nearest hospital ED for assessment if fever/cough/breathlessness are severe or illness seems like a threat to life.  It is vitally important that if you feel that you have an infection such as this virus or any other virus that you stay home and away from places where you may spread it to others.  You should avoid contact with people age 52 and older.   You can use medication such as A prescription  cough medication called Tessalon Perles 100 mg. You may take 1-2 capsules every 8 hours as needed for cough and A prescription inhaler called  Albuterol MDI 90 mcg /actuation 2 puffs every 4 hours as needed for shortness of breath, wheezing, cough  You may also take acetaminophen (Tylenol) as needed for fever.  Reduce your risk of any infection by using the same precautions used for avoiding the common cold or flu:  Marland Kitchen Wash your hands often with soap and warm water for at least 20 seconds.  If soap and water are not readily available, use an alcohol-based hand sanitizer with at least 60% alcohol.  . If coughing or sneezing, cover your mouth and nose by coughing or sneezing into the elbow areas of your shirt or coat, into a tissue or into your sleeve (not your hands). . Avoid shaking hands with others and consider head nods or verbal greetings only. . Avoid touching your eyes, nose, or mouth with unwashed hands.  . Avoid close contact with people who are sick. . Avoid places or events with large numbers of people in one location, like concerts or sporting events. . Carefully consider travel plans you have or are making. . If you are planning any travel outside or inside the [place], visit the CDC's Travelers' Health webpage for the latest health notices. . If you have some symptoms but not all symptoms, continue to monitor at home and seek medical attention if your symptoms worsen. . If you are having a medical emergency, call 911.  HOME CARE . Only take medications as instructed by your medical team. . Drink plenty of fluids and get plenty of rest. . A steam or ultrasonic humidifier can help if you have congestion.   GET HELP RIGHT AWAY IF YOU HAVE EMERGENCY WARNING SIGNS** FOR COVID-19. If you or someone is showing any of these signs seek emergency medical care immediately. Call 911 or proceed to your closest emergency facility if: . You develop worsening high fever. . Trouble breathing . Bluish lips or face . Persistent pain or pressure in the chest . New confusion . Inability to wake or stay awake . You cough up blood. . Your  symptoms become more severe  **This list is not all possible symptoms. Contact your medical provider for any symptoms that are sever or concerning to you.  MAKE SURE YOU   Understand these instructions.  Will watch your condition.  Will get help right away if you are not doing well or get worse.  Your e-visit answers were reviewed by a board certified advanced clinical practitioner to complete your personal care plan.  Depending on the condition, your plan could have included both over the counter or prescription medications.  If there is a problem please reply once you have received a response from your provider.  Your safety is important to [place].  If you have drug allergies check your prescription carefully.    You can use MyChart to ask questions about today's visit, request a non-urgent call back, or ask for a work or school excuse for 24 hours related to this e-Visit. If it has been greater than 24 hours you will need to follow up with your provider, or enter a new e-Visit to address those concerns. You will get an e-mail in the next two days asking about your experience.  I hope that your e-visit has been valuable and will speed your recovery. Thank you for using e-visits.    Greater than 5 minutes,  yet less than 10 minutes of time have been spent researching, coordinating, and implementing care for this patient today

## 2020-05-23 ENCOUNTER — Telehealth: Payer: Medicaid Other | Admitting: Family

## 2020-05-23 DIAGNOSIS — R059 Cough, unspecified: Secondary | ICD-10-CM

## 2020-05-23 MED ORDER — PREDNISONE 10 MG (21) PO TBPK: ORAL_TABLET | ORAL | 0 refills | Status: DC

## 2020-05-23 MED ORDER — AZITHROMYCIN 250 MG PO TABS: ORAL_TABLET | ORAL | 0 refills | Status: DC

## 2020-05-23 NOTE — Progress Notes (Signed)
We are sorry that you are not feeling well.  Here is how we plan to help!  Based on your presentation I believe you most likely have A cough due to bacteria.  When patients have a fever and a productive cough with a change in color or increased sputum production, we are concerned about bacterial bronchitis.  If left untreated it can progress to pneumonia.  If your symptoms do not improve with your treatment plan it is important that you contact your provider.   I have prescribed Azithromyin 250 mg: two tablets now and then one tablet daily for 4 additonal days    In addition you may use A non-prescription cough medication called Robitussin DAC. Take 2 teaspoons every 8 hours or Delsym: take 2 teaspoons every 12 hours. and A non-prescription cough medication called Mucinex DM: take 2 tablets every 12 hours.  Prednisone 10 mg daily for 6 days (see taper instructions below)  Directions for 6 day taper: Day 1: 2 tablets before breakfast, 1 after both lunch & dinner and 2 at bedtime Day 2: 1 tab before breakfast, 1 after both lunch & dinner and 2 at bedtime Day 3: 1 tab at each meal & 1 at bedtime Day 4: 1 tab at breakfast, 1 at lunch, 1 at bedtime Day 5: 1 tab at breakfast & 1 tab at bedtime Day 6: 1 tab at breakfast  From your responses in the eVisit questionnaire you describe inflammation in the upper respiratory tract which is causing a significant cough.  This is commonly called Bronchitis and has four common causes:   Allergies Viral Infections Acid Reflux Bacterial Infection Allergies, viruses and acid reflux are treated by controlling symptoms or eliminating the cause. An example might be a cough caused by taking certain blood pressure medications. You stop the cough by changing the medication. Another example might be a cough caused by acid reflux. Controlling the reflux helps control the cough.  USE OF BRONCHODILATOR ("RESCUE") INHALERS: There is a risk from using your bronchodilator too  frequently.  The risk is that over-reliance on a medication which only relaxes the muscles surrounding the breathing tubes can reduce the effectiveness of medications prescribed to reduce swelling and congestion of the tubes themselves.  Although you feel brief relief from the bronchodilator inhaler, your asthma may actually be worsening with the tubes becoming more swollen and filled with mucus.  This can delay other crucial treatments, such as oral steroid medications. If you need to use a bronchodilator inhaler daily, several times per day, you should discuss this with your provider.  There are probably better treatments that could be used to keep your asthma under control.     HOME CARE Only take medications as instructed by your medical team. Complete the entire course of an antibiotic. Drink plenty of fluids and get plenty of rest. Avoid close contacts especially the very young and the elderly Cover your mouth if you cough or cough into your sleeve. Always remember to wash your hands A steam or ultrasonic humidifier can help congestion.   GET HELP RIGHT AWAY IF: You develop worsening fever. You become short of breath You cough up blood. Your symptoms persist after you have completed your treatment plan MAKE SURE YOU  Understand these instructions. Will watch your condition. Will get help right away if you are not doing well or get worse.  Your e-visit answers were reviewed by a board certified advanced clinical practitioner to complete your personal care plan.  Depending   on the condition, your plan could have included both over the counter or prescription medications. If there is a problem please reply  once you have received a response from your provider. Your safety is important to us.  If you have drug allergies check your prescription carefully.    You can use MyChart to ask questions about today's visit, request a non-urgent call back, or ask for a work or school excuse for 24  hours related to this e-Visit. If it has been greater than 24 hours you will need to follow up with your provider, or enter a new e-Visit to address those concerns. You will get an e-mail in the next two days asking about your experience.  I hope that your e-visit has been valuable and will speed your recovery. Thank you for using e-visits.  Approximately 5 minutes was spent documenting and reviewing patient's chart.    

## 2020-06-13 DIAGNOSIS — Z419 Encounter for procedure for purposes other than remedying health state, unspecified: Secondary | ICD-10-CM | POA: Diagnosis not present

## 2020-07-01 ENCOUNTER — Telehealth: Payer: Medicaid Other | Admitting: Family

## 2020-07-01 DIAGNOSIS — A084 Viral intestinal infection, unspecified: Secondary | ICD-10-CM

## 2020-07-01 NOTE — Progress Notes (Signed)

## 2020-07-14 DIAGNOSIS — Z419 Encounter for procedure for purposes other than remedying health state, unspecified: Secondary | ICD-10-CM | POA: Diagnosis not present

## 2020-07-17 ENCOUNTER — Other Ambulatory Visit: Payer: Self-pay

## 2020-07-17 ENCOUNTER — Other Ambulatory Visit: Payer: Medicaid Other

## 2020-07-17 ENCOUNTER — Telehealth: Payer: Medicaid Other | Admitting: Emergency Medicine

## 2020-07-17 DIAGNOSIS — Z20822 Contact with and (suspected) exposure to covid-19: Secondary | ICD-10-CM | POA: Diagnosis not present

## 2020-07-17 DIAGNOSIS — J069 Acute upper respiratory infection, unspecified: Secondary | ICD-10-CM

## 2020-07-17 MED ORDER — BENZONATATE 100 MG PO CAPS: 100.0000 mg | ORAL_CAPSULE | Freq: Two times a day (BID) | ORAL | 0 refills | Status: DC | PRN

## 2020-07-17 NOTE — Progress Notes (Signed)
E-Visit for Corona Virus Screening  Your current symptoms could be consistent with the coronavirus.  Many health care providers can now test patients at their office but not all are.  [facility] has multiple testing sites. For information on our COVID testing locations and hours go to [email or web address]  We are enrolling you in our MyChart Home Monitoring for COVID19 . Daily you will receive a questionnaire within the MyChart website. Our COVID 19 response team will be monitoring your responses daily.  Testing Information: The COVID-19 Community Testing sites are testing BY APPOINTMENT ONLY.  You can schedule online at [email or web address]  If you do not have access to a smart phone or computer you may call [phone] for an appointment.   Additional testing sites in the Community:  . For [facility] Testing sites in [place]  https://www.cvs.com/minuteclinic/covid-[email or web address]-testing  . For Pop-up testing sites in [place]  https://covid19.ncdhhs.gov/about-covid-19/testing/find-my-testing-place/pop-testing-sites  . For [facility] https://www.[email or web address].gov[place]/human-services/health-department/coronavirus-covid-19-info/covid-19-testing  . For [place] testing in [place] and [place] https://www.[place].gov/[place]/human-services/health-department/coronavirus-covid-19-info/covid-19-testing  . For [facility] testing in [place]   [email or web address]  For  more information about community testing call [phone]   Please quarantine yourself while awaiting your test results. Please stay home for a minimum of 10 days from the first day of illness with improving symptoms and you have had 24 hours of no fever (without the use of Tylenol (Acetaminophen) Motrin (Ibuprofen) or any fever reducing medication).  Also - Do not get tested prior to returning to work because once you have had a positive test the test can stay  positive for more than a month in some cases.   You should wear a mask or cloth face covering over your nose and mouth if you must be around other people or animals, including pets (even at home). Try to stay at least 6 feet away from other people. This will protect the people around you.  Please continue good preventive care measures, including:  frequent hand-washing, avoid touching your face, cover coughs/sneezes, stay out of crowds and keep a 6 foot distance from others.  COVID-19 is a respiratory illness with symptoms that are similar to the flu. Symptoms are typically mild to moderate, but there have been cases of severe illness and death due to the virus.   The following symptoms may appear 2-14 days after exposure: . Fever . Cough . Shortness of breath or difficulty breathing . Chills . Repeated shaking with chills . Muscle pain . Headache . Sore throat . New loss of taste or smell . Fatigue . Congestion or runny nose . Nausea or vomiting . Diarrhea  Go to the nearest hospital ED for assessment if fever/cough/breathlessness are severe or illness seems like a threat to life.  It is vitally important that if you feel that you have an infection such as this virus or any other virus that you stay home and away from places where you may spread it to others.  You should avoid contact with people age 65 and older.   You can use medication such as A prescription cough medication called Tessalon Perles 100 mg. You may take 1-2 capsules every 8 hours as needed for cough  You may also take acetaminophen (Tylenol) as needed for fever.  Reduce your risk of any infection by using the same precautions used for avoiding the common cold or flu:  . Wash your hands often with soap and warm water for at least 20 seconds.  If soap and water are not   readily available, use an alcohol-based hand sanitizer with at least 60% alcohol.  . If coughing or sneezing, cover your mouth and nose by coughing or  sneezing into the elbow areas of your shirt or coat, into a tissue or into your sleeve (not your hands). . Avoid shaking hands with others and consider head nods or verbal greetings only. . Avoid touching your eyes, nose, or mouth with unwashed hands.  . Avoid close contact with people who are sick. . Avoid places or events with large numbers of people in one location, like concerts or sporting events. . Carefully consider travel plans you have or are making. . If you are planning any travel outside or inside the [place], visit the CDC's Travelers' Health webpage for the latest health notices. . If you have some symptoms but not all symptoms, continue to monitor at home and seek medical attention if your symptoms worsen. . If you are having a medical emergency, call 911.  HOME CARE . Only take medications as instructed by your medical team. . Drink plenty of fluids and get plenty of rest. . A steam or ultrasonic humidifier can help if you have congestion.   GET HELP RIGHT AWAY IF YOU HAVE EMERGENCY WARNING SIGNS** FOR COVID-19. If you or someone is showing any of these signs seek emergency medical care immediately. Call 911 or proceed to your closest emergency facility if: . You develop worsening high fever. . Trouble breathing . Bluish lips or face . Persistent pain or pressure in the chest . New confusion . Inability to wake or stay awake . You cough up blood. . Your symptoms become more severe  **This list is not all possible symptoms. Contact your medical provider for any symptoms that are sever or concerning to you.  MAKE SURE YOU   Understand these instructions.  Will watch your condition.  Will get help right away if you are not doing well or get worse.  Your e-visit answers were reviewed by a board certified advanced clinical practitioner to complete your personal care plan.  Depending on the condition, your plan could have included both over the counter or prescription  medications.  If there is a problem please reply once you have received a response from your provider.  Your safety is important to us.  If you have drug allergies check your prescription carefully.    You can use MyChart to ask questions about today's visit, request a non-urgent call back, or ask for a work or school excuse for 24 hours related to this e-Visit. If it has been greater than 24 hours you will need to follow up with your provider, or enter a new e-Visit to address those concerns. You will get an e-mail in the next two days asking about your experience.  I hope that your e-visit has been valuable and will speed your recovery. Thank you for using e-visits.   Approximately 5 minutes was used in reviewing the patient's chart, questionnaire, prescribing medications, and documentation.  

## 2020-07-19 DIAGNOSIS — Z20822 Contact with and (suspected) exposure to covid-19: Secondary | ICD-10-CM | POA: Diagnosis not present

## 2020-07-23 LAB — NOVEL CORONAVIRUS, NAA: SARS-CoV-2, NAA: DETECTED — AB

## 2020-07-23 LAB — SARS-COV-2, NAA 2 DAY TAT

## 2020-07-29 IMAGING — CT CT RENAL STONE PROTOCOL
2 of 4 series · 16 of 46 positions shown, 18 images · non-contrast
Comparison: CT abdomen pelvis 12/17/2015

CLINICAL DATA: Neg preg test today. Triage note: Pt comes EMS from
home after having sharp lower left abdominal pain and not being able
to urinate since 9am. EMS reports a mass like area on back side near
left kidney that is tender with palpation.

EXAM:
CT ABDOMEN AND PELVIS WITHOUT CONTRAST
TECHNIQUE: Multidetector CT imaging of the abdomen and pelvis was performed
following the standard protocol without IV contrast.

[Series 2: stone full standard · axial · 0.83mm/px · z∈[-525,-65]mm · 13 of 102 slices shown, 15 images]
[im 5/102  soft-tissue]
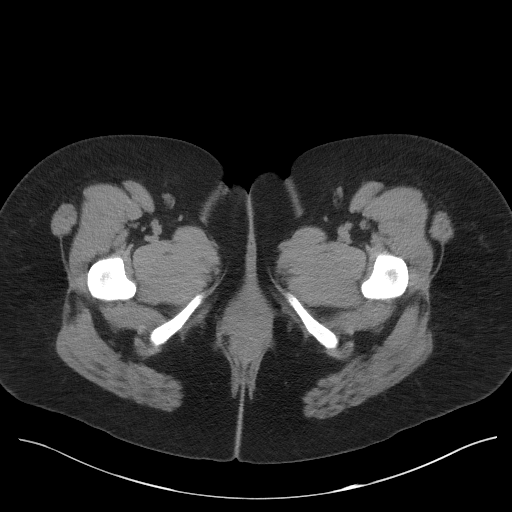
[im 5/102  bone]
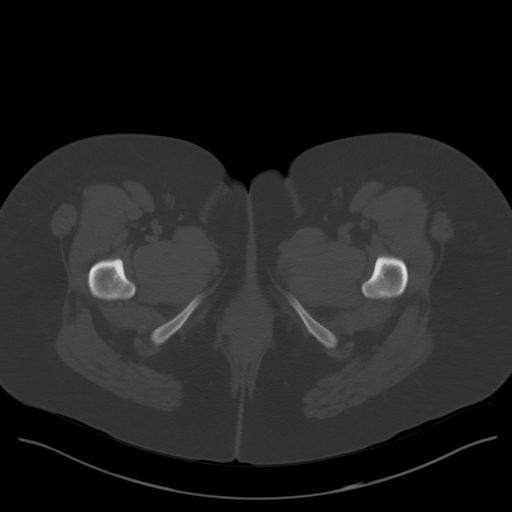
[im 13/102  soft-tissue]
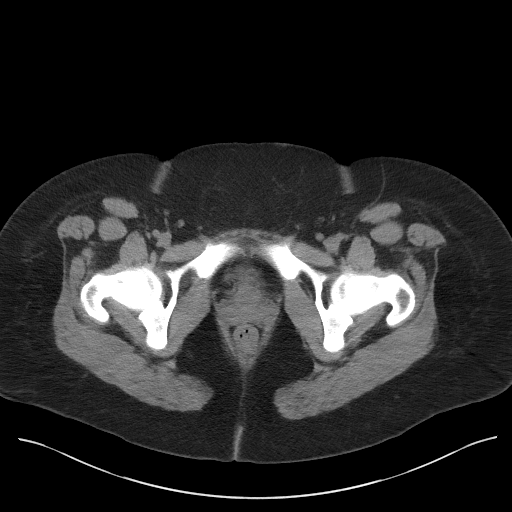
[im 22/102  soft-tissue]
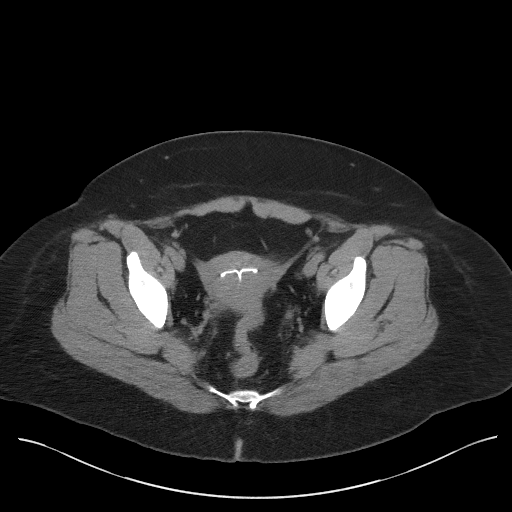
[im 30/102  soft-tissue]
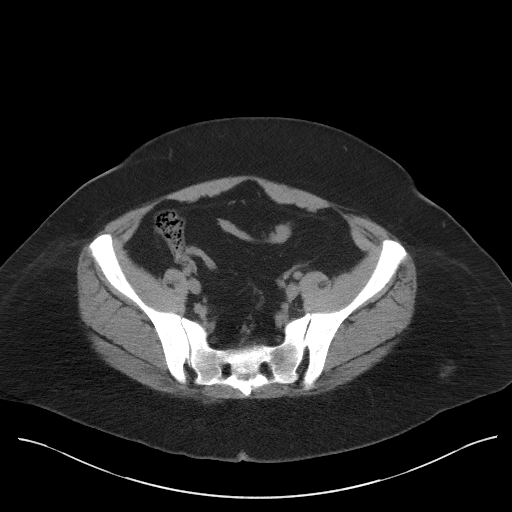
[im 34/102  soft-tissue]
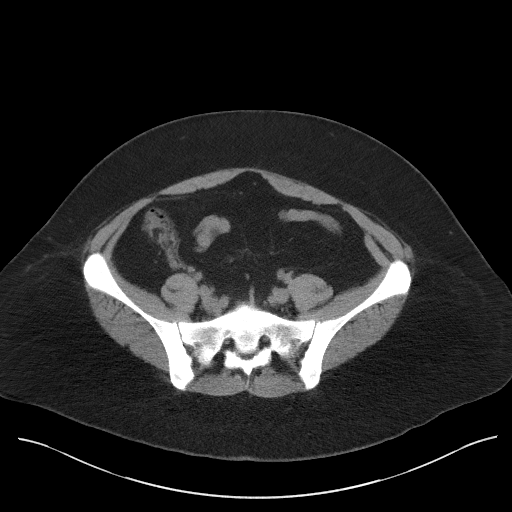
[im 43/102  soft-tissue]
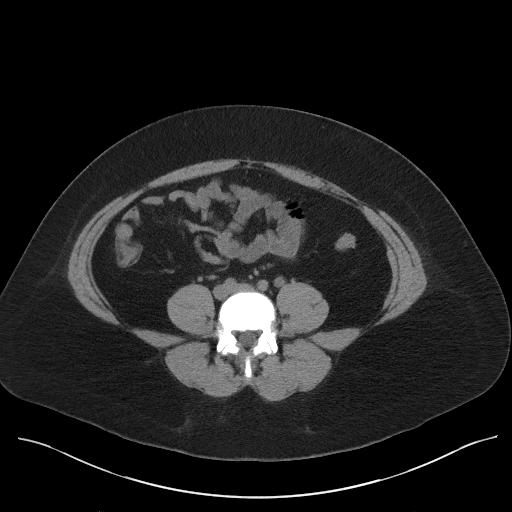
[im 51/102  soft-tissue]
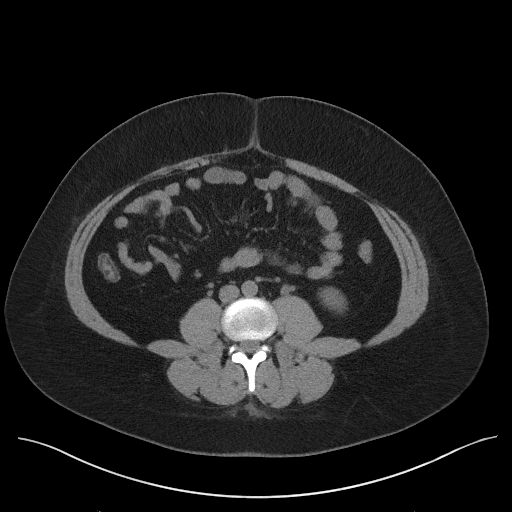
[im 59/102  soft-tissue]
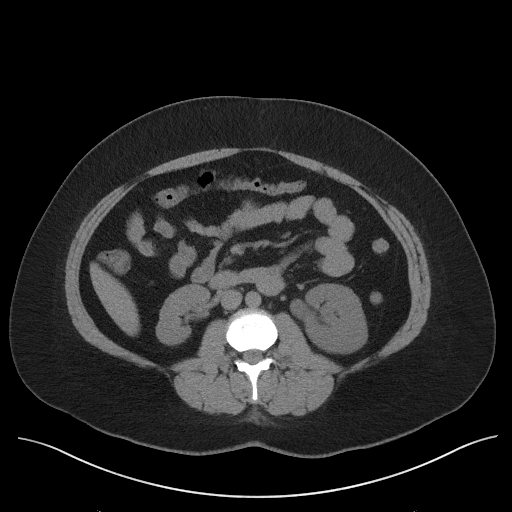
[im 68/102  soft-tissue]
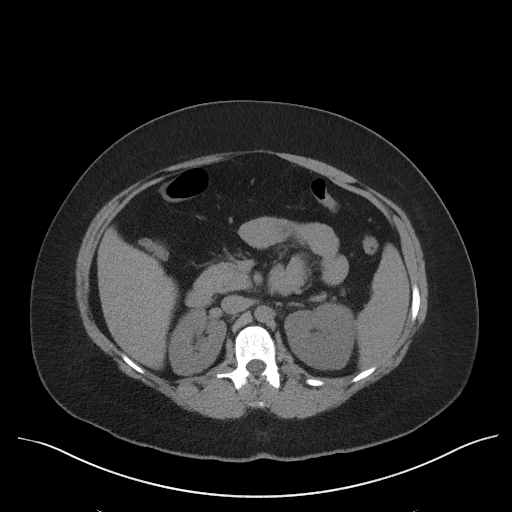
[im 68/102  bone]
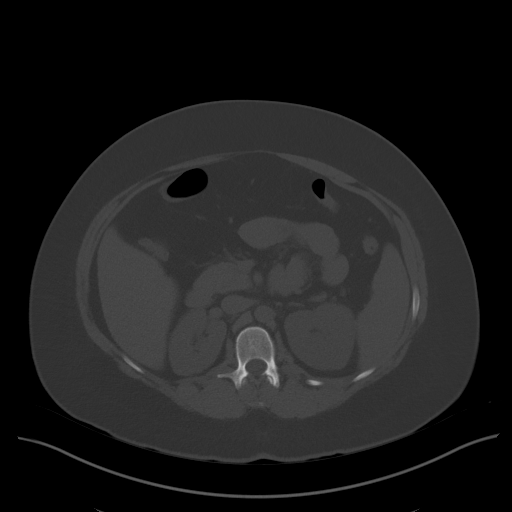
[im 72/102  soft-tissue]
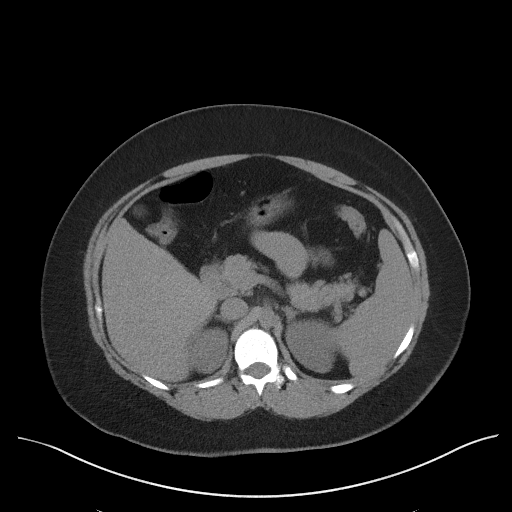
[im 80/102  soft-tissue]
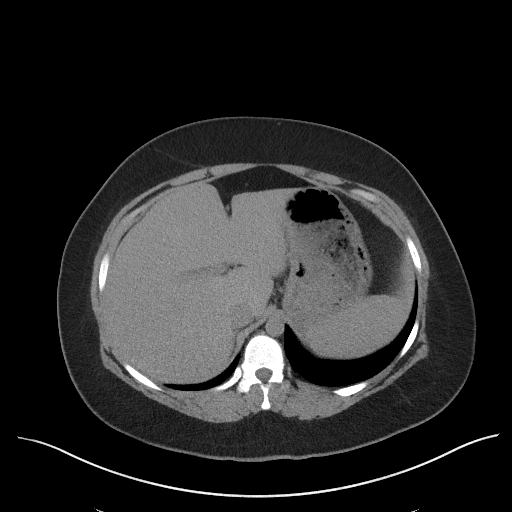
[im 89/102  soft-tissue]
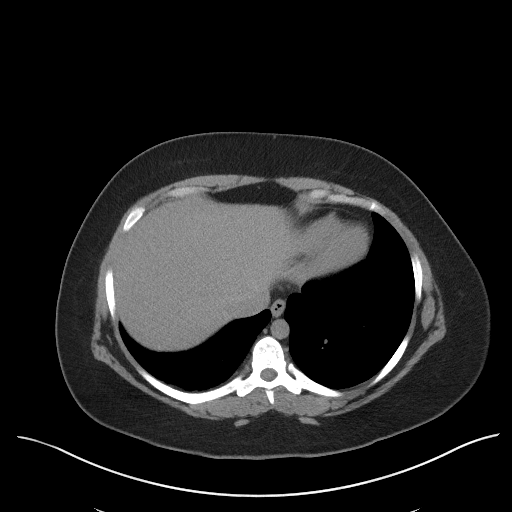
[im 97/102  soft-tissue]
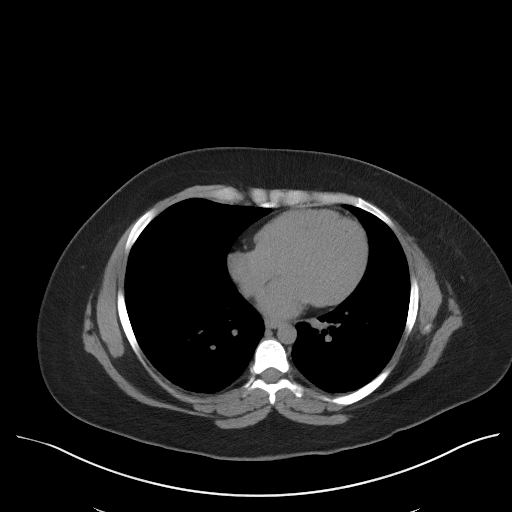

[Series 5: coronal · coronal · 0.93mm/px · 3 of 139 slices shown]
[im 47/139  soft-tissue]
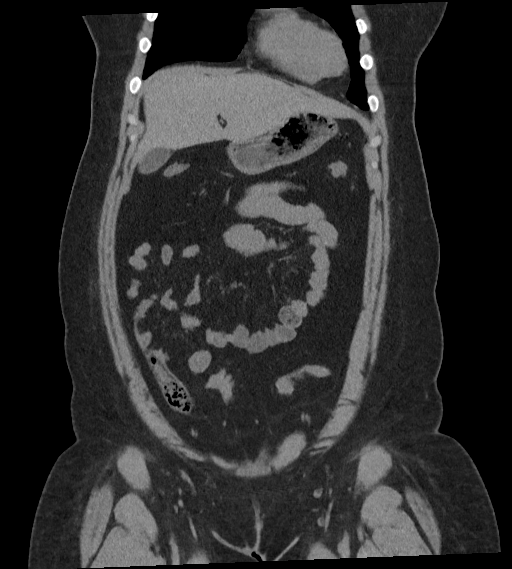
[im 62/139  soft-tissue]
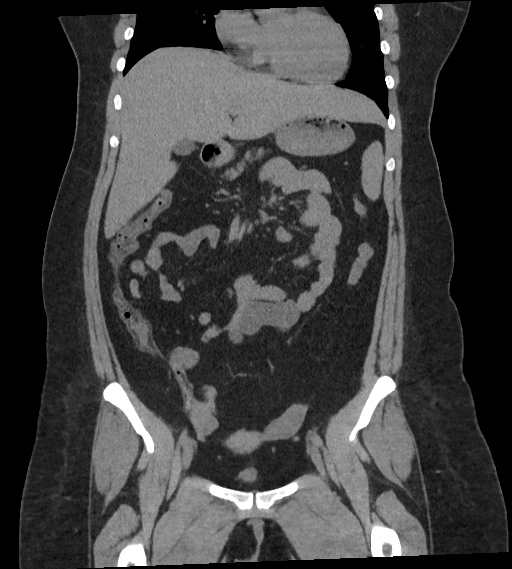
[im 77/139  soft-tissue]
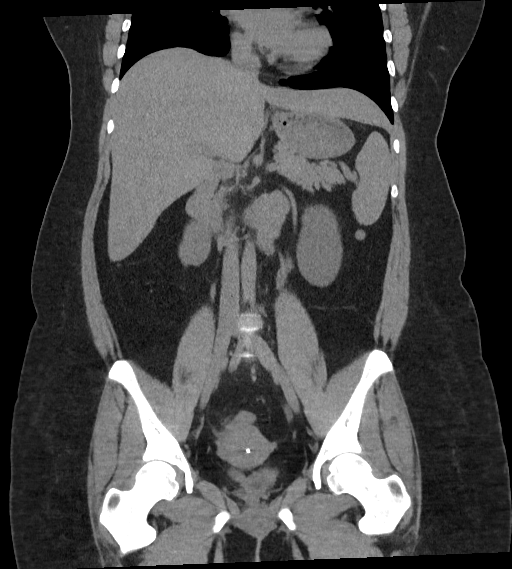

[16 of 46 positions shown; findings below may reference images not displayed]

FINDINGS: Lower chest: No acute abnormality.

Hepatobiliary: No focal liver abnormality is seen. No gallstones,
gallbladder wall thickening, or biliary dilatation.

Pancreas: Unremarkable

Spleen: Normal in size without focal abnormality.

Adrenals/Urinary Tract: Adrenal glands are unremarkable. Asymmetric
enlargement of the left kidney. There are several left renal
calculi, largest in the inferior pole measuring 3 mm. There is mild
left hydronephrosis and ureterectasis with a 3 mm calculus at the
left UVJ likely causing the mild upstream obstruction. There are a
couple of punctate renal calculi in the right kidney. No
hydronephrosis. Bladder is unremarkable.

Stomach/Bowel: Stomach is within normal limits. Appendix appears
normal. No evidence of bowel wall thickening, distention, or
inflammatory changes.

Vascular/Lymphatic: No significant vascular findings are present. No
enlarged abdominal or pelvic lymph nodes.

Reproductive: Uterus and bilateral adnexa are unremarkable. IUD in
place.

Other: No abdominal wall hernia or abnormality. No abdominopelvic
ascites.

Musculoskeletal: No acute or significant osseous findings.
IMPRESSION: 1. Mild to moderate left hydronephrosis and ureterectasis likely
secondary to a 3 mm stone in the region of the left UVJ.
2. Nonobstructive right renal calculi.

## 2020-07-30 ENCOUNTER — Telehealth: Payer: Medicaid Other | Admitting: Physician Assistant

## 2020-07-30 DIAGNOSIS — K047 Periapical abscess without sinus: Secondary | ICD-10-CM

## 2020-07-30 DIAGNOSIS — K0889 Other specified disorders of teeth and supporting structures: Secondary | ICD-10-CM | POA: Diagnosis not present

## 2020-07-30 MED ORDER — IBUPROFEN 600 MG PO TABS
600.0000 mg | ORAL_TABLET | Freq: Three times a day (TID) | ORAL | 0 refills | Status: DC | PRN
Start: 2020-07-30 — End: 2020-11-13

## 2020-07-30 MED ORDER — CLINDAMYCIN HCL 300 MG PO CAPS: 300.0000 mg | ORAL_CAPSULE | Freq: Four times a day (QID) | ORAL | 0 refills | Status: AC

## 2020-07-30 NOTE — Progress Notes (Signed)
E-Visit for Dental Pain  We are sorry that you are not feeling well.  Here is how we plan to help!  Based on what you have shared with me in the questionnaire, it sounds like you have an infection caused by a broken tooth.  Ibuprofen 600mg  3 times a day for 7 days for discomfort and Clindamycin 300mg  4 times per day for days  It is imperative that you see a dentist within 10 days of this eVisit to determine the cause of the dental pain and be sure it is adequately treated  A toothache or tooth pain is caused when the nerve in the root of a tooth or surrounding a tooth is irritated. Dental (tooth) infection, decay, injury, or loss of a tooth are the most common causes of dental pain. Pain may also occur after an extraction (tooth is pulled out). Pain sometimes originates from other areas and radiates to the jaw, thus appearing to be tooth pain.Bacteria growing inside your mouth can contribute to gum disease and dental decay, both of which can cause pain. A toothache occurs from inflammation of the central portion of the tooth called pulp. The pulp contains nerve endings that are very sensitive to pain. Inflammation to the pulp or pulpitis may be caused by dental cavities, trauma, and infection.    HOME CARE:   For toothaches: . Over-the-counter pain medications such as acetaminophen or ibuprofen may be used. Take these as directed on the package while you arrange for a dental appointment. . Avoid very cold or hot foods, because they may make the pain worse. . You may get relief from biting on a cotton ball soaked in oil of cloves. You can get oil of cloves at most drug stores.  For jaw pain: .  Aspirin may be helpful for problems in the joint of the jaw in adults. . If pain happens every time you open your mouth widely, the temporomandibular joint (TMJ) may be the source of the pain. Yawning or taking a large bite of food may worsen the pain. An appointment with your doctor or dentist will help  you find the cause.     GET HELP RIGHT AWAY IF:  . You have a high fever or chills . If you have had a recent head or face injury and develop headache, light headedness, nausea, vomiting, or other symptoms that concern you after an injury to your face or mouth, you could have a more serious injury in addition to your dental injury. . A facial rash associated with a toothache: This condition may improve with medication. Contact your doctor for them to decide what is appropriate. . Any jaw pain occurring with chest pain: Although jaw pain is most commonly caused by dental disease, it is sometimes referred pain from other areas. People with heart disease, especially people who have had stents placed, people with diabetes, or those who have had heart surgery may have jaw pain as a symptom of heart attack or angina. If your jaw or tooth pain is associated with lightheadedness, sweating, or shortness of breath, you should see a doctor as soon as possible. . Trouble swallowing or excessive pain or bleeding from gums: If you have a history of a weakened immune system, diabetes, or steroid use, you may be more susceptible to infections. Infections can often be more severe and extensive or caused by unusual organisms. Dental and gum infections in people with these conditions may require more aggressive treatment. An abscess may need draining  or IV antibiotics, for example.  MAKE SURE YOU    Understand these instructions.  Will watch your condition.  Will get help right away if you are not doing well or get worse.  Thank you for choosing an e-visit. Your e-visit answers were reviewed by a board certified advanced clinical practitioner to complete your personal care plan. Depending upon the condition, your plan could have included both over the counter or prescription medications. Please review your pharmacy choice. Make sure the pharmacy is open so you can pick up prescription now. If there is a problem,  you may contact your provider through [employer] and have the prescription routed to another pharmacy. Your safety is important to [place]. If you have drug allergies check your prescription carefully.  For the next 24 hours you can use MyChart to ask questions about today's visit, request a non-urgent call back, or ask for a work or school excuse. You will get an email in the next two days asking about your experience. I hope that your e-visit has been valuable and will speed your recovery.  Greater than 5 minutes, yet less than 10 minutes of time have been spent researching, coordinating and implementing care for this patient today.

## 2020-08-08 ENCOUNTER — Telehealth: Payer: Medicaid Other | Admitting: Family

## 2020-08-08 DIAGNOSIS — L409 Psoriasis, unspecified: Secondary | ICD-10-CM

## 2020-08-08 MED ORDER — CLOBETASOL PROPIONATE 0.05 % EX CREA: 1.0000 "application " | TOPICAL_CREAM | Freq: Two times a day (BID) | CUTANEOUS | 1 refills | Status: DC

## 2020-08-08 NOTE — Progress Notes (Signed)
E Visit for Rash  We are sorry that you are not feeling well. Here is how we plan to help!  Given your rash, it appears this could be psoriasis. You need to follow up with dermatologists. I have sent in clobetasol 0.05% cream twice a day. Avoid applying this to your face.   HOME CARE:   Take cool showers and avoid direct sunlight.  Apply cool compress or wet dressings.  Take a bath in an oatmeal bath.  Sprinkle content of one Aveeno packet under running faucet with comfortably warm water.  Bathe for 15-20 minutes, 1-2 times daily.  Pat dry with a towel. Do not rub the rash.  Use hydrocortisone cream.  Take an antihistamine like Benadryl for widespread rashes that itch.  The adult dose of Benadryl is 25-50 mg by mouth 4 times daily.  Caution:  This type of medication may cause sleepiness.  Do not drink alcohol, drive, or operate dangerous machinery while taking antihistamines.  Do not take these medications if you have prostate enlargement.  Read package instructions thoroughly on all medications that you take.  GET HELP RIGHT AWAY IF:   Symptoms don't go away after treatment.  Severe itching that persists.  If you rash spreads or swells.  If you rash begins to smell.  If it blisters and opens or develops a yellow-brown crust.  You develop a fever.  You have a sore throat.  You become short of breath.  MAKE SURE YOU:  Understand these instructions. Will watch your condition. Will get help right away if you are not doing well or get worse.  Thank you for choosing an e-visit. Your e-visit answers were reviewed by a board certified advanced clinical practitioner to complete your personal care plan. Depending upon the condition, your plan could have included both over the counter or prescription medications. Please review your pharmacy choice. Be sure that the pharmacy you have chosen is open so that you can pick up your prescription now.  If there is a problem you may  message your provider in MyChart to have the prescription routed to another pharmacy. Your safety is important to [place]. If you have drug allergies check your prescription carefully.  For the next 24 hours, you can use MyChart to ask questions about today's visit, request a non-urgent call back, or ask for a work or school excuse from your e-visit provider. You will get an email in the next two days asking about your experience. I hope that your e-visit has been valuable and will speed your recovery.  Approximately 5 minutes was spent documenting and reviewing patient's chart.

## 2020-08-14 DIAGNOSIS — Z419 Encounter for procedure for purposes other than remedying health state, unspecified: Secondary | ICD-10-CM | POA: Diagnosis not present

## 2020-08-14 IMAGING — CR DG ABDOMEN 1V
1 series · 2 of 2 positions shown · non-contrast
Comparison: 04/05/2019

CLINICAL DATA: Follow-up renal calculi

EXAM:
ABDOMEN - 1 VIEW

[Series 1: dg abd 1 view · 0.14mm/px · 2 of 2 slices shown]
[im 1/2]
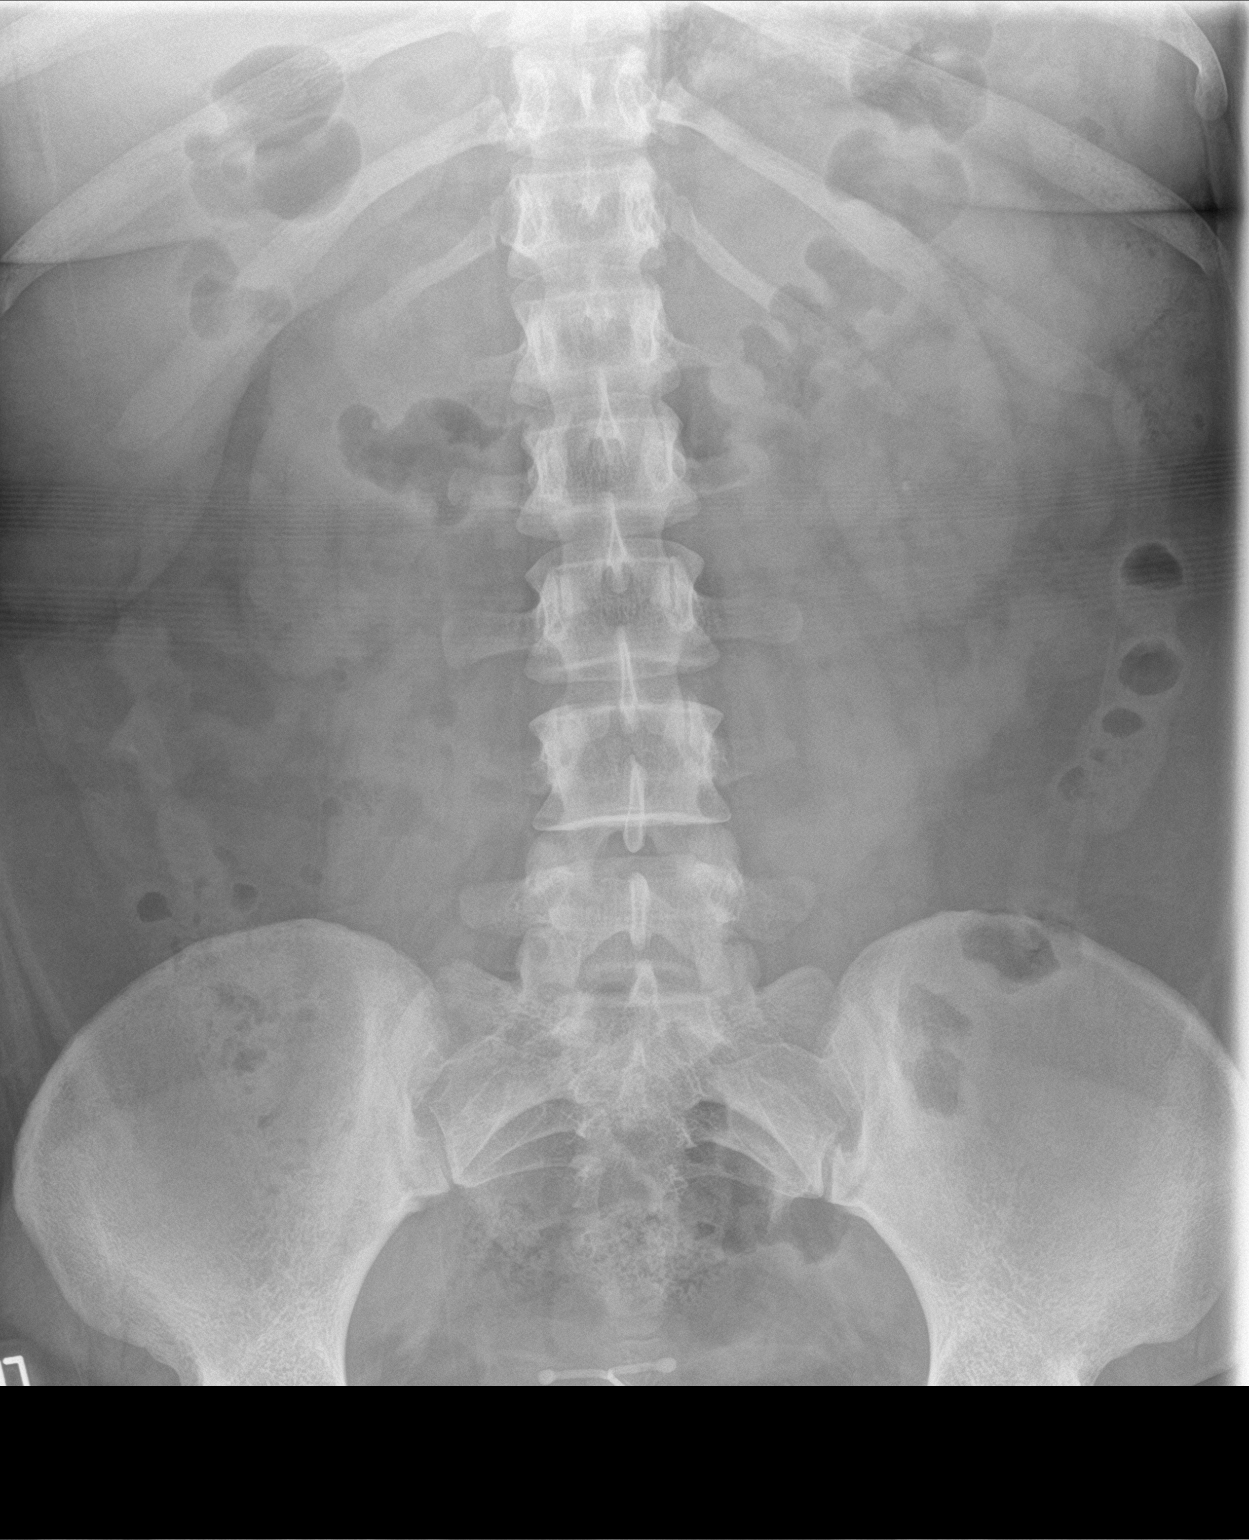
[im 2/2]
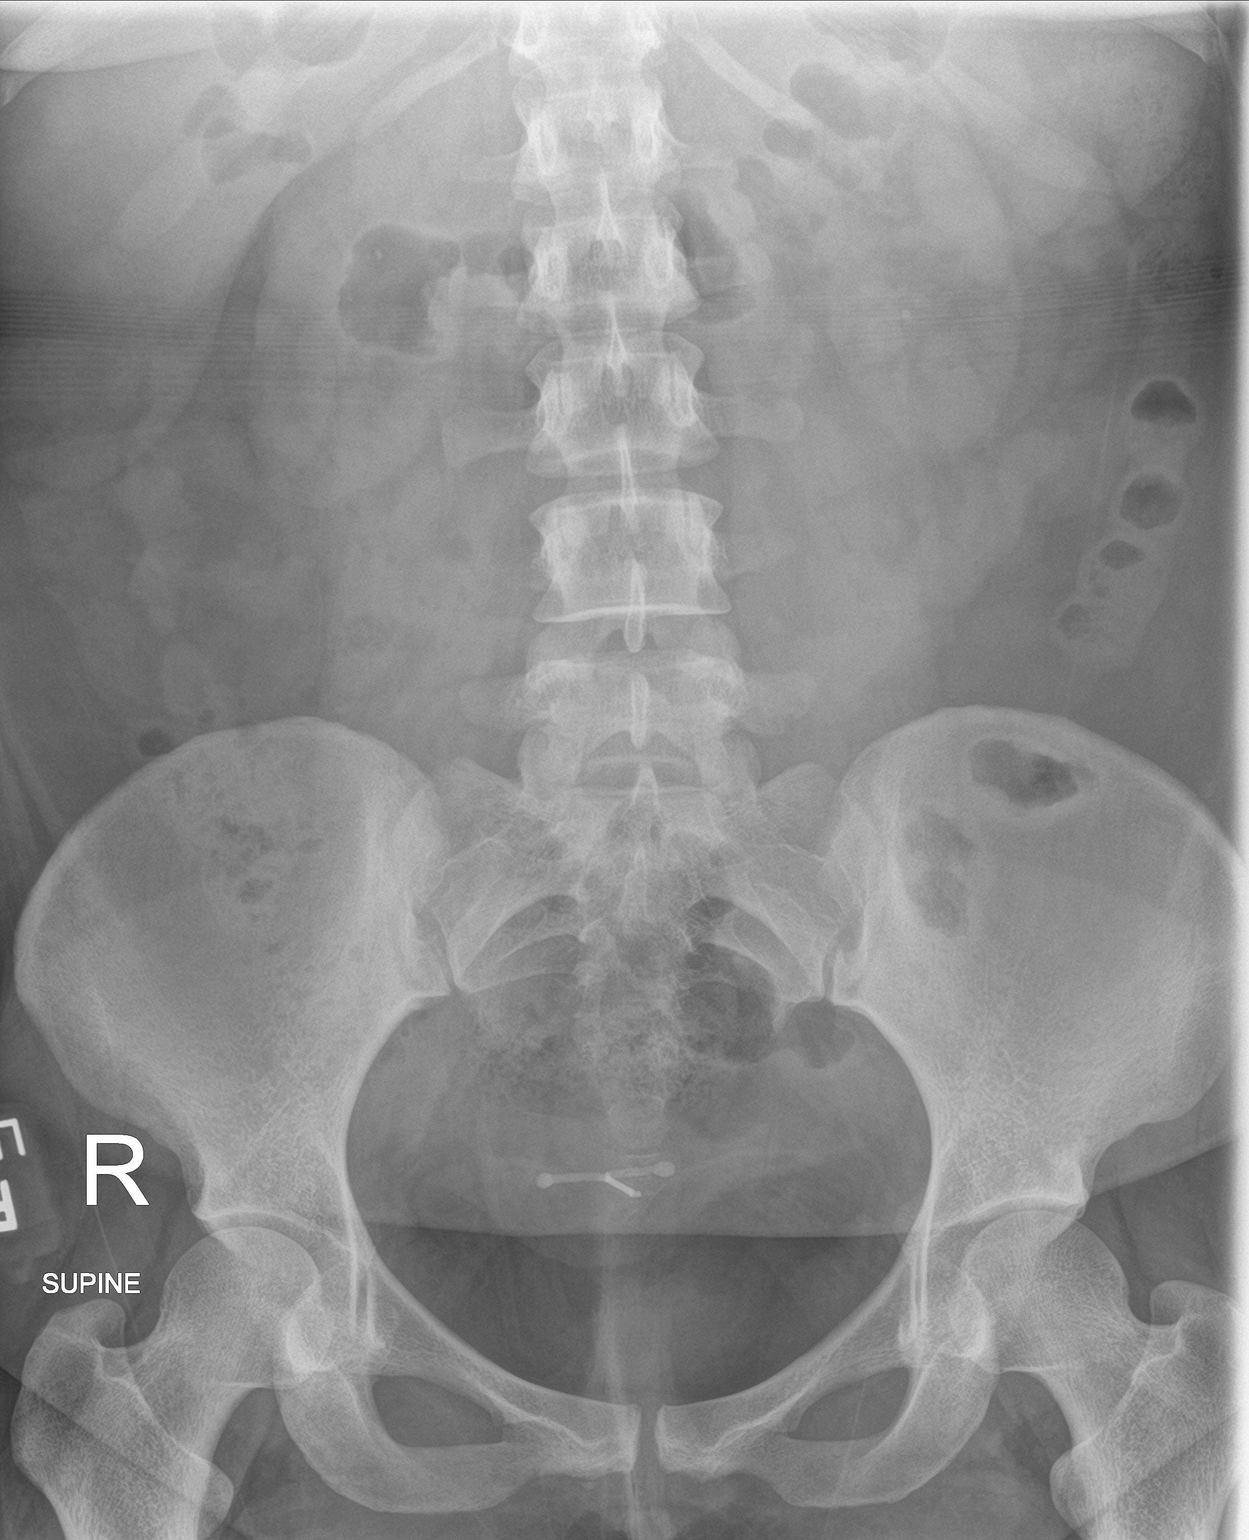

[2 of 2 positions shown; findings below may reference images not displayed]

FINDINGS: Scattered large and small bowel gas is noted. An IUD is noted in
place. Tiny left lower pole renal stone is again identified. Two
right-sided renal stones are again seen and stable. No ureteral
stones are seen.
IMPRESSION: Stable bilateral renal calculi.

## 2020-09-11 ENCOUNTER — Telehealth: Payer: Medicaid Other | Admitting: Physician Assistant

## 2020-09-11 DIAGNOSIS — R112 Nausea with vomiting, unspecified: Secondary | ICD-10-CM

## 2020-09-11 DIAGNOSIS — Z419 Encounter for procedure for purposes other than remedying health state, unspecified: Secondary | ICD-10-CM | POA: Diagnosis not present

## 2020-09-11 DIAGNOSIS — R197 Diarrhea, unspecified: Secondary | ICD-10-CM

## 2020-09-11 MED ORDER — ONDANSETRON HCL 4 MG PO TABS: 4.0000 mg | ORAL_TABLET | Freq: Three times a day (TID) | ORAL | 0 refills | Status: DC | PRN

## 2020-09-11 NOTE — Progress Notes (Signed)

## 2020-10-01 IMAGING — CR DG ABDOMEN 1V
1 series · 2 of 2 positions shown · non-contrast
Comparison: 05/05/2019, 04/28/2019, CT 03/29/2019

CLINICAL DATA: Bilateral kidney stones post lithotripsy

EXAM:
ABDOMEN - 1 VIEW

[Series 1: dg abd 1 view · 0.14mm/px · 2 of 2 slices shown]
[im 1/2]
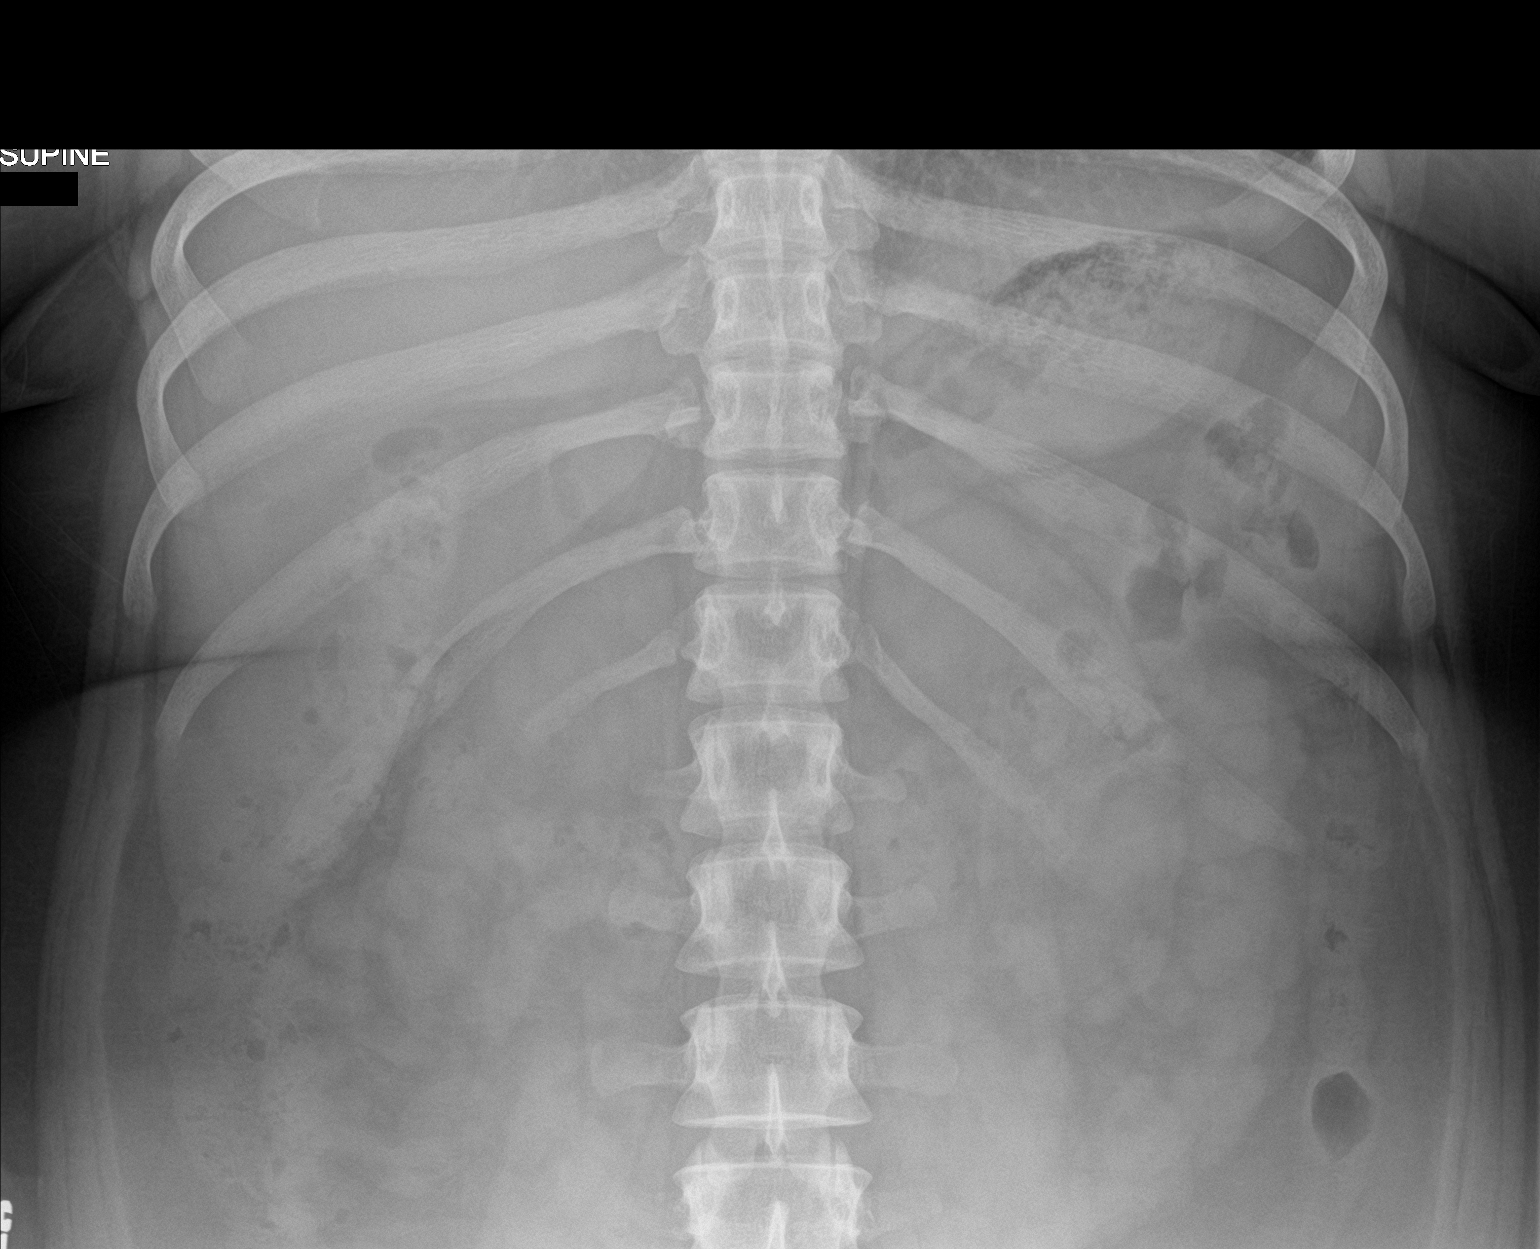
[im 2/2]
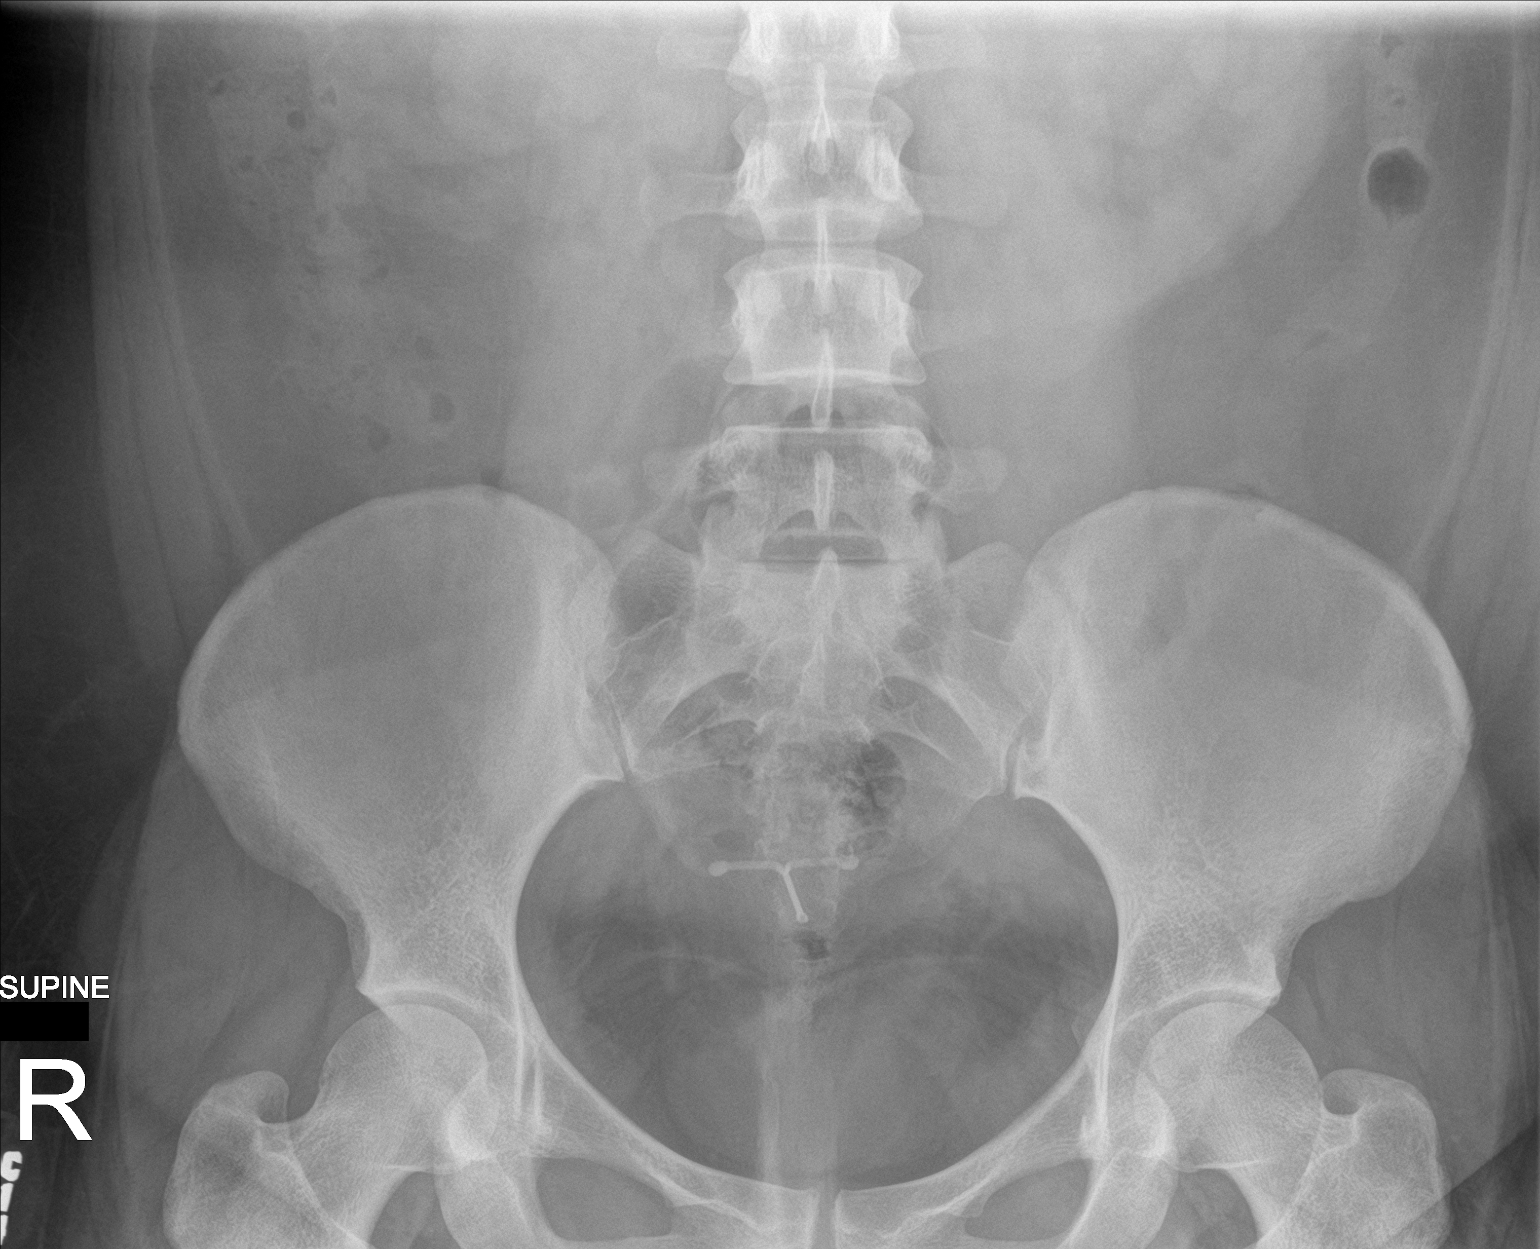

[2 of 2 positions shown; findings below may reference images not displayed]

FINDINGS: Nonobstructed gas pattern. No definitive radiopaque calculi
identified. IUD in the pelvis.
IMPRESSION: No definitive radiopaque calculi identified on today's study.
Negative bowel gas pattern

## 2020-10-12 DIAGNOSIS — Z419 Encounter for procedure for purposes other than remedying health state, unspecified: Secondary | ICD-10-CM | POA: Diagnosis not present

## 2020-10-18 ENCOUNTER — Telehealth: Payer: Medicaid Other | Admitting: Physician Assistant

## 2020-10-18 DIAGNOSIS — R197 Diarrhea, unspecified: Secondary | ICD-10-CM | POA: Diagnosis not present

## 2020-10-18 DIAGNOSIS — R112 Nausea with vomiting, unspecified: Secondary | ICD-10-CM

## 2020-10-18 MED ORDER — PROMETHAZINE HCL 25 MG PO TABS: 25.0000 mg | ORAL_TABLET | Freq: Three times a day (TID) | ORAL | 0 refills | Status: DC | PRN

## 2020-10-18 NOTE — Progress Notes (Signed)
We are sorry that you are not feeling well.  Here is how we plan to help!  Based on what you have shared with me it looks like you have Acute Infectious Diarrhea.  Most cases of acute diarrhea are due to infections with virus and bacteria and are self-limited conditions lasting less than 14 days.  For your symptoms you may take Imodium 2 mg tablets that are over the counter at your local pharmacy. Take two tablet now and then one after each loose stool up to 6 a day.  Antibiotics are not needed for most people with diarrhea.  Optional: Promethazine 25 mg take 1 tablet twice daily   HOME CARE  We recommend changing your diet to help with your symptoms for the next few days.  Drink plenty of fluids that contain water salt and sugar. Sports drinks such as Gatorade may help.   You may try broths, soups, bananas, applesauce, soft breads, mashed potatoes or crackers.   You are considered infectious for as long as the diarrhea continues. Hand washing or use of alcohol based hand sanitizers is recommend.  It is best to stay out of work or school until your symptoms stop.   GET HELP RIGHT AWAY  If you have dark yellow colored urine or do not pass urine frequently you should drink more fluids.    If your symptoms worsen   If you feel like you are going to pass out (faint)  You have a new problem  MAKE SURE YOU   Understand these instructions.  Will watch your condition.  Will get help right away if you are not doing well or get worse.  Your e-visit answers were reviewed by a board certified advanced clinical practitioner to complete your personal care plan.  Depending on the condition, your plan could have included both over the counter or prescription medications.  If there is a problem please reply  once you have received a response from your provider.  Your safety is important to [place].  If you have drug allergies check your prescription carefully.    You can use MyChart to ask  questions about today's visit, request a non-urgent call back, or ask for a work or school excuse for 24 hours related to this e-Visit. If it has been greater than 24 hours you will need to follow up with your provider, or enter a new e-Visit to address those concerns.   You will get an e-mail in the next two days asking about your experience.  I hope that your e-visit has been valuable and will speed your recovery. Thank you for using e-visits.  Approximately 5 minutes was spent documenting and reviewing patient's chart.

## 2020-10-20 ENCOUNTER — Telehealth: Payer: Medicaid Other | Admitting: Physician Assistant

## 2020-10-20 DIAGNOSIS — R197 Diarrhea, unspecified: Secondary | ICD-10-CM

## 2020-10-20 DIAGNOSIS — R109 Unspecified abdominal pain: Secondary | ICD-10-CM

## 2020-10-20 MED ORDER — AZITHROMYCIN 500 MG PO TABS: 500.0000 mg | ORAL_TABLET | Freq: Every day | ORAL | 0 refills | Status: DC

## 2020-10-20 NOTE — Progress Notes (Signed)
We are sorry that you are not feeling well.  Here is how we plan to help!  Based on what you have shared with me it looks like you have Acute Infectious Diarrhea.  Most cases of acute diarrhea are due to infections with virus and bacteria and are self-limited conditions lasting less than 14 days.  For your symptoms you may take Imodium 2 mg tablets that are over the counter at your local pharmacy. Take two tablet now and then one after each loose stool up to 6 a day.  Antibiotics are not needed for most people with diarrhea.    Optional: I have prescribed azithromycin 500 mg daily for 3 days  HOME CARE  We recommend changing your diet to help with your symptoms for the next few days.  Drink plenty of fluids that contain water salt and sugar. Sports drinks such as Gatorade may help.   You may try broths, soups, bananas, applesauce, soft breads, mashed potatoes or crackers.   You are considered infectious for as long as the diarrhea continues. Hand washing or use of alcohol based hand sanitizers is recommend.  It is best to stay out of work or school until your symptoms stop.   GET HELP RIGHT AWAY  If you have dark yellow colored urine or do not pass urine frequently you should drink more fluids.    If your symptoms worsen   If you feel like you are going to pass out (faint)  You have a new problem  MAKE SURE YOU   Understand these instructions.  Will watch your condition.  Will get help right away if you are not doing well or get worse.  Your e-visit answers were reviewed by a board certified advanced clinical practitioner to complete your personal care plan.  Depending on the condition, your plan could have included both over the counter or prescription medications.  If there is a problem please reply  once you have received a response from your provider.  Your safety is important to [place].  If you have drug allergies check your prescription carefully.    You can use  MyChart to ask questions about today's visit, request a non-urgent call back, or ask for a work or school excuse for 24 hours related to this e-Visit. If it has been greater than 24 hours you will need to follow up with your provider, or enter a new e-Visit to address those concerns.   You will get an e-mail in the next two days asking about your experience.  I hope that your e-visit has been valuable and will speed your recovery. Thank you for using e-visits.  I spent 5-10 minutes on review and completion of this note- [facility]

## 2020-10-31 DIAGNOSIS — J069 Acute upper respiratory infection, unspecified: Secondary | ICD-10-CM | POA: Diagnosis not present

## 2020-11-02 ENCOUNTER — Telehealth: Payer: Medicaid Other | Admitting: Physician Assistant

## 2020-11-02 DIAGNOSIS — J40 Bronchitis, not specified as acute or chronic: Secondary | ICD-10-CM

## 2020-11-02 MED ORDER — PREDNISONE 10 MG (21) PO TBPK: ORAL_TABLET | ORAL | 0 refills | Status: DC

## 2020-11-02 MED ORDER — BENZONATATE 100 MG PO CAPS: 100.0000 mg | ORAL_CAPSULE | Freq: Three times a day (TID) | ORAL | 0 refills | Status: DC | PRN

## 2020-11-02 NOTE — Progress Notes (Signed)
We are sorry that you are not feeling well.  Here is how we plan to help!  Based on your presentation I believe you most likely have A cough due to a virus.  This is called viral bronchitis and is best treated by rest, plenty of fluids and control of the cough.  You may use Ibuprofen or Tylenol as directed to help your symptoms.     In addition you may use A prescription cough medication called Tessalon Perles 100mg. You may take 1-2 capsules every 8 hours as needed for your cough.  Prednisone 10 mg daily for 6 days (see taper instructions below)  Directions for 6 day taper: Day 1: 2 tablets before breakfast, 1 after both lunch & dinner and 2 at bedtime Day 2: 1 tab before breakfast, 1 after both lunch & dinner and 2 at bedtime Day 3: 1 tab at each meal & 1 at bedtime Day 4: 1 tab at breakfast, 1 at lunch, 1 at bedtime Day 5: 1 tab at breakfast & 1 tab at bedtime Day 6: 1 tab at breakfast   From your responses in the eVisit questionnaire you describe inflammation in the upper respiratory tract which is causing a significant cough.  This is commonly called Bronchitis and has four common causes:    Allergies  Viral Infections  Acid Reflux  Bacterial Infection Allergies, viruses and acid reflux are treated by controlling symptoms or eliminating the cause. An example might be a cough caused by taking certain blood pressure medications. You stop the cough by changing the medication. Another example might be a cough caused by acid reflux. Controlling the reflux helps control the cough.  USE OF BRONCHODILATOR ("RESCUE") INHALERS: There is a risk from using your bronchodilator too frequently.  The risk is that over-reliance on a medication which only relaxes the muscles surrounding the breathing tubes can reduce the effectiveness of medications prescribed to reduce swelling and congestion of the tubes themselves.  Although you feel brief relief from the bronchodilator inhaler, your asthma may  actually be worsening with the tubes becoming more swollen and filled with mucus.  This can delay other crucial treatments, such as oral steroid medications. If you need to use a bronchodilator inhaler daily, several times per day, you should discuss this with your provider.  There are probably better treatments that could be used to keep your asthma under control.     HOME CARE . Only take medications as instructed by your medical team. . Complete the entire course of an antibiotic. . Drink plenty of fluids and get plenty of rest. . Avoid close contacts especially the very young and the elderly . Cover your mouth if you cough or cough into your sleeve. . Always remember to wash your hands . A steam or ultrasonic humidifier can help congestion.   GET HELP RIGHT AWAY IF: . You develop worsening fever. . You become short of breath . You cough up blood. . Your symptoms persist after you have completed your treatment plan MAKE SURE YOU   Understand these instructions.  Will watch your condition.  Will get help right away if you are not doing well or get worse.  Your e-visit answers were reviewed by a board certified advanced clinical practitioner to complete your personal care plan.  Depending on the condition, your plan could have included both over the counter or prescription medications. If there is a problem please reply  once you have received a response from your provider. Your   safety is important to us.  If you have drug allergies check your prescription carefully.    You can use MyChart to ask questions about today's visit, request a non-urgent call back, or ask for a work or school excuse for 24 hours related to this e-Visit. If it has been greater than 24 hours you will need to follow up with your provider, or enter a new e-Visit to address those concerns. You will get an e-mail in the next two days asking about your experience.  I hope that your e-visit has been valuable and will  speed your recovery. Thank you for using e-visits.  I provided 5 minutes of non face-to-face time during this encounter for chart review and documentation.   

## 2020-11-03 ENCOUNTER — Other Ambulatory Visit: Payer: Self-pay

## 2020-11-03 ENCOUNTER — Emergency Department
Admission: EM | Admit: 2020-11-03 | Discharge: 2020-11-03 | Disposition: A | Discharge status: Home / Self Care | Payer: Medicaid Other | Attending: Emergency Medicine | Admitting: Emergency Medicine

## 2020-11-03 ENCOUNTER — Telehealth: Payer: Medicaid Other | Admitting: Physician Assistant

## 2020-11-03 ENCOUNTER — Encounter: Payer: Self-pay | Admitting: Emergency Medicine

## 2020-11-03 ENCOUNTER — Emergency Department: Payer: Medicaid Other

## 2020-11-03 DIAGNOSIS — F17210 Nicotine dependence, cigarettes, uncomplicated: Secondary | ICD-10-CM | POA: Diagnosis not present

## 2020-11-03 DIAGNOSIS — J181 Lobar pneumonia, unspecified organism: Secondary | ICD-10-CM | POA: Diagnosis not present

## 2020-11-03 DIAGNOSIS — E039 Hypothyroidism, unspecified: Secondary | ICD-10-CM | POA: Diagnosis not present

## 2020-11-03 DIAGNOSIS — R059 Cough, unspecified: Secondary | ICD-10-CM | POA: Diagnosis not present

## 2020-11-03 DIAGNOSIS — J189 Pneumonia, unspecified organism: Secondary | ICD-10-CM | POA: Diagnosis not present

## 2020-11-03 DIAGNOSIS — N189 Chronic kidney disease, unspecified: Secondary | ICD-10-CM | POA: Diagnosis not present

## 2020-11-03 DIAGNOSIS — J40 Bronchitis, not specified as acute or chronic: Secondary | ICD-10-CM

## 2020-11-03 DIAGNOSIS — Z91040 Latex allergy status: Secondary | ICD-10-CM | POA: Diagnosis not present

## 2020-11-03 MED ORDER — DOXYCYCLINE MONOHYDRATE 100 MG PO CAPS: 100.0000 mg | ORAL_CAPSULE | Freq: Two times a day (BID) | ORAL | 0 refills | Status: DC

## 2020-11-03 MED ORDER — METHYLPREDNISOLONE SODIUM SUCC 125 MG IJ SOLR
80.0000 mg | Freq: Once | INTRAMUSCULAR | Status: AC
  Administered 2020-11-03: 80 mg via INTRAMUSCULAR
  Filled 2020-11-03: qty 2

## 2020-11-03 MED ORDER — IPRATROPIUM-ALBUTEROL 0.5-2.5 (3) MG/3ML IN SOLN
3.0000 mL | Freq: Once | RESPIRATORY_TRACT | Status: AC
  Administered 2020-11-03: 3 mL via RESPIRATORY_TRACT
  Filled 2020-11-03: qty 3

## 2020-11-03 MED ORDER — HYDROCOD POLST-CPM POLST ER 10-8 MG/5ML PO SUER
5.0000 mL | Freq: Once | ORAL | Status: AC
  Administered 2020-11-03: 5 mL via ORAL
  Filled 2020-11-03: qty 5

## 2020-11-03 MED ORDER — HYDROCOD POLST-CPM POLST ER 10-8 MG/5ML PO SUER: 5.0000 mL | Freq: Two times a day (BID) | ORAL | 0 refills | Status: DC

## 2020-11-03 NOTE — ED Notes (Signed)
Cough since last week. Has tried meds with no relief. NAD. Ambulatory to xray

## 2020-11-03 NOTE — Progress Notes (Signed)
Based on what you shared with me, I feel your condition warrants further evaluation and I recommend that you be seen in a face to face office visit.   NOTE: If you entered your credit card information for this eVisit, you will not be charged. You may see a "hold" on your card for the $35 but that hold will drop off and you will not have a charge processed.   If you are having a true medical emergency please call 911.      For an urgent face to face visit, [facility] has six urgent care centers for your convenience:     [facility] [place] Get Driving Directions [phone] [address] , [place] [address] . 8 am - 4 pm Monday - Friday    [facility] ([place]) Get Driving Directions [phone] [address] [place], [place] [address] . 8 am to 8 pm Monday-Friday . 10 am to 6 pm Saturday-Sunday  [facility] [facility] [place]) Get Driving Directions [phone]  [address] [place],  [place]  [address] . 8 am to 8 pm Monday-Friday . 8 am to 4 pm Saturday-Sunday  [facility] Get Driving Directions [phone] [address], [address] , [place] [address] . 8 am to 8 pm Monday-Friday . 8 am to 4 pm Saturday-Sunday   [facility] [place] Get Driving Directions  [phone] [address] [place], [place] [address] . 8 am to 8 pm Monday-Friday . 8 am to 4 pm Saturday-Sunday   [facility] [place] Get Driving Directions [phone] [address] [place], [place] [address] . 8 am to 8 pm Monday-Friday . 8 am to 4 pm Saturday-Sunday     Your MyChart E-visit questionnaire answers were reviewed by a board certified advanced clinical practitioner to complete your personal care plan based on your specific symptoms.  Thank you for using e-Visits.     

## 2020-11-03 NOTE — Discharge Instructions (Signed)
Continue previous medication.  Start doxycycline and cephalexin as directed.  Advised Tylenol for fever.

## 2020-11-03 NOTE — ED Triage Notes (Signed)
Pt to ED via POV with c/o cough since last week. Pt states has had 2 negative covid tests at home, has tried prednisone and albuterol as prescribed by PCP without relief.

## 2020-11-03 NOTE — ED Provider Notes (Signed)
[facility] Emergency Department Provider Note   ____________________________________________   Event Date/Time   First MD Initiated Contact with Patient 11/03/20 1207     (approximate)  I have reviewed the triage vital signs and the nursing notes.   HISTORY  Chief Complaint Cough    HPI Brooke Aguirre is a 25 y.o. female patient presents with cough for 1 week.  Patient states 2 negative COVID tests at home.  Last test was done last night.  Patient states she is taking prednisone and Tessalon Perles with no relief.  Patient also state took albuterol was prescribed by her PCP secondary to her heavy smoking history.  Patient cough is nonproductive.  Patient stated recent travel to [place] the past week.  Patient has taken to COVID-19 vaccines and influenza vaccine for this season.  Patient not taking booster.      Past Medical History:  Diagnosis Date  . Abnormal Pap smear of cervix   . Chronic kidney disease    CHRONIC  RIGHT PYELONEPHRITIS  . Family history of adverse reaction to anesthesia    PTS MOM WENT INTO [facility] AFTER HYSTERECTOMY  . Heart murmur   . History of lithotripsy   . HPV in female   . Hypothyroidism   . Kidney stone   . Thyroid goiter     Patient Active Problem List   Diagnosis Date Noted  . Left nephrolithiasis 03/30/2019  . Nephrolithiasis 03/29/2019  . Status post LEEP (loop electrosurgical excision procedure) of cervix 05/03/2018  . 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 Tobacco user 02/25/2018  . Obesity (BMI 30.0-34.9) 02/25/2018  . Scar 06/24/2013  . Heart murmur 05/03/2012  . URI, acute 05/03/2012  . Pyelonephritis 12/02/2011    Past Surgical History:  Procedure Laterality Date  . EXTRACORPOREAL SHOCK WAVE LITHOTRIPSY Left 04/14/2019   Procedure: EXTRACORPOREAL SHOCK WAVE LITHOTRIPSY (ESWL);  Surgeon: Riki Altes, MD;  Location: [facility] ORS;  Service: Urology;  Laterality: Left;  .  EXTRACORPOREAL SHOCK WAVE LITHOTRIPSY Right 05/05/2019   Procedure: EXTRACORPOREAL SHOCK WAVE LITHOTRIPSY (ESWL);  Surgeon: Riki Altes, MD;  Location: [facility] ORS;  Service: Urology;  Laterality: Right;  . LEEP N/A 05/03/2018   Procedure: LOOP ELECTROSURGICAL EXCISION PROCEDURE (LEEP);  Surgeon: Herold Harms, MD;  Location: [facility] ORS;  Service: Gynecology;  Laterality: N/A;  . REMOVAL OF DRUG DELIVERY IMPLANT Left 12/15/2014   Procedure: REMOVAL OF DRUG DELIVERY IMPLANT;  Surgeon: Nadara Mustard, MD;  Location: [facility] ORS;  Service: Gynecology;  Laterality: Left;    Prior to Admission medications   Medication Sig Start Date End Date Taking? Authorizing Provider  chlorpheniramine-HYDROcodone (TUSSIONEX PENNKINETIC ER) 10-8 MG/5ML SUER Take 5 mLs by mouth 2 (two) times daily. 11/03/20  Yes Joni Reining, PA-C  doxycycline (MONODOX) 100 MG capsule Take 1 capsule (100 mg total) by mouth 2 (two) times daily. 11/03/20  Yes Joni Reining, PA-C  acetic acid-hydrocortisone (VOSOL-HC) OTIC solution Place 4 drops into the right ear 4 (four) times daily. 01/27/20   Liberty Handy, PA-C  benzonatate (TESSALON) 100 MG capsule Take 1 capsule (100 mg total) by mouth 3 (three) times daily as needed for cough. 11/02/20   Margaretann Loveless, PA-C  clobetasol cream (TEMOVATE) 0.05 % Apply 1 application topically 2 (two) times daily. 08/08/20   Jannifer Rodney A, FNP  ibuprofen (ADVIL) 600 MG tablet Take 1 tablet (600 mg total) by mouth every 8 (eight) hours as needed. 07/30/20  McVey, Madelaine Bhat, PA-C  levonorgestrel (MIRENA) 20 MCG/24HR IUD 1 each by Intrauterine route once.    [provider]  ondansetron (ZOFRAN) 4 MG tablet Take 1 tablet (4 mg total) by mouth every 8 (eight) hours as needed for nausea or vomiting. 09/11/20   Couture, Cortni S, PA-C  predniSONE (STERAPRED UNI-PAK 21 TAB) 10 MG (21) TBPK tablet 6 day taper; take as directed on package instructions 11/02/20   Margaretann Loveless, PA-C  promethazine (PHENERGAN) 25 MG tablet Take 1 tablet (25 mg total) by mouth every 8 (eight) hours as needed for nausea or vomiting. 10/18/20   Couture, Cortni S, PA-C    Allergies Kiwi extract, Amoxicillin, Ciprofloxacin, Penicillins, Tramadol, Cranberry, Latex, and Sulfa antibiotics  Family History  Problem Relation Age of Onset  . Hepatitis C Mother   . Hepatitis C Father   . Breast cancer Neg Hx   . Ovarian cancer Neg Hx   . Colon cancer Neg Hx     Social History Social History   Tobacco Use  . Smoking status: Current Every Day Smoker    Packs/day: 1.00    Years: 5.00    Pack years: 5.00    Types: Cigarettes  . Smokeless tobacco: Never Used  Vaping Use  . Vaping Use: Never used  Substance Use Topics  . Alcohol use: Yes    Comment: occasional   . Drug use: No    Review of Systems  Constitutional: No fever/chills Eyes: No visual changes. ENT: No sore throat. Cardiovascular: Denies chest pain. Respiratory:  shortness of breath, wheezing, and coughing. Gastrointestinal: No abdominal pain.  No nausea, no vomiting.  No diarrhea.  No constipation. Genitourinary: Negative for dysuria. Musculoskeletal: Negative for back pain. Skin: Negative for rash. Neurological: Negative for headaches, focal weakness or numbness.  Endocrine:  Hypothyroidism Allergic/Immunilogical: Amoxicillin, kiwi extract, Cipro, penicillin, tramadol, cranberry, latex, sulfa antibiotics. ____________________________________________   PHYSICAL EXAM:  VITAL SIGNS: ED Triage Vitals  Enc Vitals Group     BP 11/03/20 1206 (!) 141/95     Pulse Rate 11/03/20 1206 (!) 105     Resp 11/03/20 1206 (!) 22     Temp 11/03/20 1206 97.8 F (36.6 C)     Temp Source 11/03/20 1206 Oral     SpO2 11/03/20 1206 96 %     Weight 11/03/20 1207 215 lb (97.5 kg)     Height 11/03/20 1207 4\' 11"  (1.499 m)     Head Circumference --      Peak Flow --      Pain Score 11/03/20 1207 7     Pain Loc --       Pain Edu? --      Excl. in GC? --    Constitutional: Alert and oriented. Well appearing and in no acute distress. Eyes: Conjunctivae are normal. PERRL. EOMI. Head: Atraumatic. Nose: No congestion/rhinnorhea. Mouth/Throat: Mucous membranes are moist.  Oropharynx non-erythematous. Neck: No stridor.  No cervical spine tenderness to palpation. Hematological/Lymphatic/Immunilogical: No cervical lymphadenopathy.  Tachycardic Cardiovascular: Tachycardic, regular rhythm. Grossly normal heart sounds.  Good peripheral circulation. Respiratory: Normal respiratory effort.  No retractions. Lungs bilateral wheezing.  Gastrointestinal: Soft and nontender. No distention. No abdominal bruits. No CVA tenderness. Genitourinary: Deferred Musculoskeletal: No lower extremity tenderness nor edema.  No joint effusions. Neurologic:  Normal speech and language. No gross focal neurologic deficits are appreciated. No gait instability. Skin:  Skin is warm, dry and intact. No rash noted. Psychiatric: Mood and affect are normal. Speech  and behavior are normal.  ____________________________________________   LABS (all labs ordered are listed, but only abnormal results are displayed)  Labs Reviewed - No data to display ____________________________________________  EKG   ____________________________________________  RADIOLOGY I, Joni Reining, personally viewed and evaluated these images (plain radiographs) as part of my medical decision making, as well as reviewing the written report by the radiologist.  ED MD interpretation:    Official radiology report(s): DG Chest 2 View  Result Date: 11/03/2020 CLINICAL DATA:  Cough for a week. EXAM: CHEST - 2 VIEW COMPARISON:  Nov 18, 2018 FINDINGS: Possible mild opacity in the left perihilar region and left base. The heart, hila, mediastinum, lungs, and pleura are otherwise unremarkable. IMPRESSION: Possible subtle infiltrate in the left lung is above. Subtle pneumonia  not excluded. Recommend short-term follow-up imaging to ensure resolution. Electronically Signed   By: Gerome Sam III M.D   On: 11/03/2020 12:56    ____________________________________________   PROCEDURES  Procedure(s) performed (including Critical Care):  Procedures   ____________________________________________   INITIAL IMPRESSION / ASSESSMENT AND PLAN / ED COURSE  As part of my medical decision making, I reviewed the following data within the electronic MEDICAL RECORD NUMBER         Patient presents for cough for 1 week.  Patient is refractory to Tessalon and inhaler.  Patient responded well to 2 nebulized treatments.  Discussed chest x-ray with patient consistent with early pneumonia.  Patient given discharge care instruction advised take medication as directed      ____________________________________________   FINAL CLINICAL IMPRESSION(S) / ED DIAGNOSES  Final diagnoses:  Community acquired pneumonia of left lower lobe of lung     ED Discharge Orders         Ordered    doxycycline (MONODOX) 100 MG capsule  2 times daily        11/03/20 1336    chlorpheniramine-HYDROcodone (TUSSIONEX PENNKINETIC ER) 10-8 MG/5ML SUER  2 times daily        11/03/20 1336          *Please note:  Brooke Aguirre was evaluated in Emergency Department on 11/03/2020 for the symptoms described in the history of present illness. She was evaluated in the context of the global COVID-19 pandemic, which necessitated consideration that the patient might be at risk for infection with the SARS-CoV-2 virus that causes COVID-19. Institutional protocols and algorithms that pertain to the evaluation of patients at risk for COVID-19 are in a state of rapid change based on information released by regulatory bodies including the CDC and federal and state organizations. These policies and algorithms were followed during the patient's care in the ED.  Some ED evaluations and interventions may be  delayed as a result of limited staffing during and the pandemic.*   Note:  This document was prepared using Dragon voice recognition software and may include unintentional dictation errors.    Joni Reining, PA-C 11/03/20 1340    Sharman Cheek, MD 11/03/20 1539

## 2020-11-05 ENCOUNTER — Telehealth: Payer: Self-pay | Admitting: *Deleted

## 2020-11-05 DIAGNOSIS — J189 Pneumonia, unspecified organism: Secondary | ICD-10-CM | POA: Diagnosis not present

## 2020-11-05 DIAGNOSIS — L732 Hidradenitis suppurativa: Secondary | ICD-10-CM | POA: Diagnosis not present

## 2020-11-05 DIAGNOSIS — J9801 Acute bronchospasm: Secondary | ICD-10-CM | POA: Diagnosis not present

## 2020-11-05 DIAGNOSIS — Z6841 Body Mass Index (BMI) 40.0 and over, adult: Secondary | ICD-10-CM | POA: Diagnosis not present

## 2020-11-05 NOTE — Telephone Encounter (Signed)
Transition Care Management Follow-up Telephone Call  Date of discharge and from where: 11/03/2020 [facility] ED  How have you been since you were released from the hospital? "Feeling a little better"  Any questions or concerns? No  Items Reviewed:  Did the pt receive and understand the discharge instructions provided? Yes   Medications obtained and verified? Yes   Other? No   Any new allergies since your discharge? No   Dietary orders reviewed? No  Do you have support at home? Yes    Functional Questionnaire: (I = Independent and D = Dependent) ADLs: I  Bathing/Dressing- I  Meal Prep- I  Eating- I  Maintaining continence- I  Transferring/Ambulation- I  Managing Meds- I  Follow up appointments reviewed:   PCP Hospital f/u appt confirmed? No    Specialist Hospital f/u appt confirmed? No    Are transportation arrangements needed? No   If their condition worsens, is the pt aware to call PCP or go to the Emergency Dept.? Yes  Was the patient provided with contact information for the PCP's office or ED? Yes  Was to pt encouraged to call back with questions or concerns? Yes

## 2020-11-11 DIAGNOSIS — Z419 Encounter for procedure for purposes other than remedying health state, unspecified: Secondary | ICD-10-CM | POA: Diagnosis not present

## 2020-11-13 ENCOUNTER — Emergency Department: Payer: Medicaid Other

## 2020-11-13 ENCOUNTER — Encounter: Payer: Self-pay | Admitting: Emergency Medicine

## 2020-11-13 ENCOUNTER — Emergency Department
Admission: EM | Admit: 2020-11-13 | Discharge: 2020-11-13 | Disposition: A | Discharge status: Home / Self Care | Payer: Medicaid Other | Attending: Emergency Medicine | Admitting: Emergency Medicine

## 2020-11-13 DIAGNOSIS — J189 Pneumonia, unspecified organism: Secondary | ICD-10-CM | POA: Diagnosis not present

## 2020-11-13 DIAGNOSIS — R059 Cough, unspecified: Secondary | ICD-10-CM

## 2020-11-13 DIAGNOSIS — N189 Chronic kidney disease, unspecified: Secondary | ICD-10-CM | POA: Diagnosis not present

## 2020-11-13 DIAGNOSIS — Z20822 Contact with and (suspected) exposure to covid-19: Secondary | ICD-10-CM | POA: Insufficient documentation

## 2020-11-13 DIAGNOSIS — R079 Chest pain, unspecified: Secondary | ICD-10-CM | POA: Diagnosis not present

## 2020-11-13 DIAGNOSIS — Z91040 Latex allergy status: Secondary | ICD-10-CM | POA: Insufficient documentation

## 2020-11-13 DIAGNOSIS — F17210 Nicotine dependence, cigarettes, uncomplicated: Secondary | ICD-10-CM | POA: Diagnosis not present

## 2020-11-13 DIAGNOSIS — R0602 Shortness of breath: Secondary | ICD-10-CM | POA: Insufficient documentation

## 2020-11-13 DIAGNOSIS — R1011 Right upper quadrant pain: Secondary | ICD-10-CM | POA: Diagnosis not present

## 2020-11-13 DIAGNOSIS — E039 Hypothyroidism, unspecified: Secondary | ICD-10-CM | POA: Insufficient documentation

## 2020-11-13 DIAGNOSIS — R109 Unspecified abdominal pain: Secondary | ICD-10-CM

## 2020-11-13 DIAGNOSIS — R600 Localized edema: Secondary | ICD-10-CM | POA: Diagnosis not present

## 2020-11-13 DIAGNOSIS — N23 Unspecified renal colic: Secondary | ICD-10-CM | POA: Diagnosis not present

## 2020-11-13 LAB — D-DIMER, QUANTITATIVE: D-Dimer, Quant: 0.66 ug/mL-FEU — ABNORMAL HIGH (ref 0.00–0.50)

## 2020-11-13 LAB — CBC WITH DIFFERENTIAL/PLATELET
Abs Immature Granulocytes: 0.08 10*3/uL — ABNORMAL HIGH (ref 0.00–0.07)
Basophils Absolute: 0.1 10*3/uL (ref 0.0–0.1)
Basophils Relative: 1 %
Eosinophils Absolute: 0.2 10*3/uL (ref 0.0–0.5)
Eosinophils Relative: 1 %
HCT: 45 % (ref 36.0–46.0)
Hemoglobin: 15.6 g/dL — ABNORMAL HIGH (ref 12.0–15.0)
Immature Granulocytes: 1 %
Lymphocytes Relative: 39 %
Lymphs Abs: 4.9 10*3/uL — ABNORMAL HIGH (ref 0.7–4.0)
MCH: 33.6 pg (ref 26.0–34.0)
MCHC: 34.7 g/dL (ref 30.0–36.0)
MCV: 97 fL (ref 80.0–100.0)
Monocytes Absolute: 0.9 10*3/uL (ref 0.1–1.0)
Monocytes Relative: 7 %
Neutro Abs: 6.5 10*3/uL (ref 1.7–7.7)
Neutrophils Relative %: 51 %
Platelets: 266 10*3/uL (ref 150–400)
RBC: 4.64 MIL/uL (ref 3.87–5.11)
RDW: 12.2 % (ref 11.5–15.5)
WBC: 12.6 10*3/uL — ABNORMAL HIGH (ref 4.0–10.5)
nRBC: 0 % (ref 0.0–0.2)

## 2020-11-13 LAB — URINALYSIS, ROUTINE W REFLEX MICROSCOPIC
Bacteria, UA: NONE SEEN
Bilirubin Urine: NEGATIVE
Glucose, UA: NEGATIVE mg/dL
Ketones, ur: NEGATIVE mg/dL
Leukocytes,Ua: NEGATIVE
Nitrite: NEGATIVE
Protein, ur: NEGATIVE mg/dL
Specific Gravity, Urine: 1.025 (ref 1.005–1.030)
pH: 5 (ref 5.0–8.0)

## 2020-11-13 LAB — POC URINE PREG, ED: Preg Test, Ur: NEGATIVE

## 2020-11-13 LAB — LIPASE, BLOOD: Lipase: 36 U/L (ref 11–51)

## 2020-11-13 LAB — COMPREHENSIVE METABOLIC PANEL
ALT: 61 U/L — ABNORMAL HIGH (ref 0–44)
AST: 32 U/L (ref 15–41)
Albumin: 4.2 g/dL (ref 3.5–5.0)
Alkaline Phosphatase: 64 U/L (ref 38–126)
Anion gap: 11 (ref 5–15)
BUN: 15 mg/dL (ref 6–20)
CO2: 22 mmol/L (ref 22–32)
Calcium: 9.6 mg/dL (ref 8.9–10.3)
Chloride: 106 mmol/L (ref 98–111)
Creatinine, Ser: 0.68 mg/dL (ref 0.44–1.00)
GFR, Estimated: >60 mL/min (ref >60)
Glucose, Bld: 97 mg/dL (ref 70–99)
Potassium: 4.1 mmol/L (ref 3.5–5.1)
Sodium: 139 mmol/L (ref 135–145)
Total Bilirubin: 0.9 mg/dL (ref 0.3–1.2)
Total Protein: 7.7 g/dL (ref 6.5–8.1)

## 2020-11-13 LAB — RESP PANEL BY RT-PCR (FLU A&B, COVID) ARPGX2
Influenza A by PCR: NEGATIVE
Influenza B by PCR: NEGATIVE
SARS Coronavirus 2 by RT PCR: NEGATIVE

## 2020-11-13 LAB — TROPONIN I (HIGH SENSITIVITY): Troponin I (High Sensitivity): <2 ng/L (ref <18)

## 2020-11-13 MED ORDER — IBUPROFEN 800 MG PO TABS: 800.0000 mg | ORAL_TABLET | Freq: Three times a day (TID) | ORAL | 0 refills | Status: DC | PRN

## 2020-11-13 MED ORDER — IOHEXOL 350 MG/ML SOLN
100.0000 mL | Freq: Once | INTRAVENOUS | Status: AC | PRN
  Administered 2020-11-13: 100 mL via INTRAVENOUS

## 2020-11-13 MED ORDER — KETOROLAC TROMETHAMINE 30 MG/ML IJ SOLN
30.0000 mg | Freq: Once | INTRAMUSCULAR | Status: AC
  Administered 2020-11-13: 30 mg via INTRAVENOUS
  Filled 2020-11-13: qty 1

## 2020-11-13 MED ORDER — METHOCARBAMOL 500 MG PO TABS: 500.0000 mg | ORAL_TABLET | Freq: Three times a day (TID) | ORAL | 0 refills | Status: DC | PRN

## 2020-11-13 NOTE — ED Triage Notes (Signed)
Pt diagnosed with pneumonia 4/23, finished antibiotics as well as prednisone but has continued to have consistent cough and now upper thoracic back pain that radiates across back.

## 2020-11-13 NOTE — ED Notes (Signed)
Pt returned from CT at this time.  

## 2020-11-13 NOTE — Discharge Instructions (Signed)
You may alternate Tylenol 1000 mg every 6 hours as needed for pain, fever and Ibuprofen 800 mg every 8 hours as needed for pain, fever.  Please take Ibuprofen with food.  Do not take more than 4000 mg of Tylenol (acetaminophen) in a 24 hour period.  

## 2020-11-13 NOTE — ED Provider Notes (Signed)
[facility] Emergency Department Provider Note  ____________________________________________   Event Date/Time   First MD Initiated Contact with Patient 11/13/20 [phone]     (approximate)  I have reviewed the triage vital signs and the nursing notes.   HISTORY  Chief Complaint Back Pain and Cough    HPI Brooke Aguirre is a 25 y.o. female with history of kidney stones, hypothyroidism who presents to the emergency department with complaints of right flank and right upper quadrant abdominal pain x 4 days.  States everything started the week of Easter when she began coughing.  Was seen here in the emergency department and was diagnosed with left lower lobe pneumonia on 11/03/2020 was started on doxycycline.  States she saw her primary care physician several days later and they took her off of doxycycline and switched her to azithromycin.  She has also been using albuterol inhaler and finished a course of prednisone.  States she is still coughing and now it is productive in nature.  No fevers but is having right-sided chest pain, shortness of breath and flank pain.  States this feels different than her previous kidney stones.  No nausea, vomiting or diarrhea.  Pain worse with coughing.  No history of PE or DVT.  No lower extremity swelling or pain.  No dysuria or hematuria.  Has had negative COVID test at home and a negative COVID test with her PCP.  Has been vaccinated x2 against COVID-19 but no booster shot.        Past Medical History:  Diagnosis Date  . Abnormal Pap smear of cervix   . Chronic kidney disease    CHRONIC  RIGHT PYELONEPHRITIS  . Family history of adverse reaction to anesthesia    PTS MOM WENT INTO [facility] AFTER HYSTERECTOMY  . Heart murmur   . History of lithotripsy   . HPV in female   . Hypothyroidism   . Kidney stone   . Thyroid goiter     Patient Active Problem List   Diagnosis Date Noted  . Left nephrolithiasis 03/30/2019  .  Nephrolithiasis 03/29/2019  . Status post LEEP (loop electrosurgical excision procedure) of cervix 05/03/2018  . 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 Tobacco user 02/25/2018  . Obesity (BMI 30.0-34.9) 02/25/2018  . Scar 06/24/2013  . Heart murmur 05/03/2012  . URI, acute 05/03/2012  . Pyelonephritis 12/02/2011    Past Surgical History:  Procedure Laterality Date  . EXTRACORPOREAL SHOCK WAVE LITHOTRIPSY Left 04/14/2019   Procedure: EXTRACORPOREAL SHOCK WAVE LITHOTRIPSY (ESWL);  Surgeon: Riki Altes, MD;  Location: [facility] ORS;  Service: Urology;  Laterality: Left;  . EXTRACORPOREAL SHOCK WAVE LITHOTRIPSY Right 05/05/2019   Procedure: EXTRACORPOREAL SHOCK WAVE LITHOTRIPSY (ESWL);  Surgeon: Riki Altes, MD;  Location: [facility] ORS;  Service: Urology;  Laterality: Right;  . LEEP N/A 05/03/2018   Procedure: LOOP ELECTROSURGICAL EXCISION PROCEDURE (LEEP);  Surgeon: Herold Harms, MD;  Location: [facility] ORS;  Service: Gynecology;  Laterality: N/A;  . REMOVAL OF DRUG DELIVERY IMPLANT Left 12/15/2014   Procedure: REMOVAL OF DRUG DELIVERY IMPLANT;  Surgeon: Nadara Mustard, MD;  Location: [facility] ORS;  Service: Gynecology;  Laterality: Left;    Prior to Admission medications   Medication Sig Start Date End Date Taking? Authorizing Provider  ibuprofen (ADVIL) 800 MG tablet Take 1 tablet (800 mg total) by mouth every 8 (eight) hours as needed for mild pain. 11/13/20  Yes Felesha Moncrieffe, Layla Maw, DO  methocarbamol (ROBAXIN) 500  MG tablet Take 1 tablet (500 mg total) by mouth every 8 (eight) hours as needed for muscle spasms. 11/13/20  Yes Ifeoluwa Beller, Layla Maw, DO  clobetasol cream (TEMOVATE) 0.05 % Apply 1 application topically 2 (two) times daily. 08/08/20   Junie Spencer, FNP  levonorgestrel (MIRENA) 20 MCG/24HR IUD 1 each by Intrauterine route once.    [provider]  promethazine (PHENERGAN) 25 MG tablet Take 1 tablet (25 mg total) by mouth every 8 (eight) hours as needed for nausea  or vomiting. 10/18/20 11/13/20  Couture, Cortni S, PA-C    Allergies Kiwi extract, Amoxicillin, Ciprofloxacin, Penicillins, Tramadol, Cranberry, Latex, and Sulfa antibiotics  Family History  Problem Relation Age of Onset  . Hepatitis C Mother   . Hepatitis C Father   . Breast cancer Neg Hx   . Ovarian cancer Neg Hx   . Colon cancer Neg Hx     Social History Social History   Tobacco Use  . Smoking status: Current Every Day Smoker    Packs/day: 1.00    Years: 5.00    Pack years: 5.00    Types: Cigarettes  . Smokeless tobacco: Never Used  Vaping Use  . Vaping Use: Never used  Substance Use Topics  . Alcohol use: Yes    Comment: occasional   . Drug use: No    Review of Systems Constitutional: No fever. Eyes: No visual changes. ENT: No sore throat. Cardiovascular: + chest pain. Respiratory: + shortness of breath. Gastrointestinal: No nausea, vomiting, diarrhea. Genitourinary: Negative for dysuria. Musculoskeletal: + for back pain. Skin: Negative for rash. Neurological: Negative for focal weakness or numbness.  ____________________________________________   PHYSICAL EXAM:  VITAL SIGNS: ED Triage Vitals [11/13/20 0230]  Enc Vitals Group     BP (!) 140/101     Pulse Rate (!) 101     Resp 20     Temp 97.9 F (36.6 C)     Temp Source Oral     SpO2 99 %     Weight      Height      Head Circumference      Peak Flow      Pain Score      Pain Loc      Pain Edu?      Excl. in GC?    CONSTITUTIONAL: Alert and oriented and responds appropriately to questions. Well-appearing; well-nourished HEAD: Normocephalic EYES: Conjunctivae clear, pupils appear equal, EOM appear intact ENT: normal nose; moist mucous membranes NECK: Supple, normal ROM CARD: Regular and minimally tachycardic; S1 and S2 appreciated; no murmurs, no clicks, no rubs, no gallops CHEST:  Chest wall is nontender to palpation.  No crepitus, ecchymosis, erythema, warmth, rash or other lesions present.    RESP: Normal chest excursion without splinting or tachypnea; breath sounds clear and equal bilaterally; no wheezes, no rhonchi, no rales, no hypoxia or respiratory distress, speaking full sentences ABD/GI: Normal bowel sounds; non-distended; soft, ender in the right upper quadrant without guarding or rebound, negative Murphy sign, no tenderness at McBurney's point BACK: The back appears normal, no midline spinal tenderness or step-off or deformity, reports pain in the right flank but it is not reproducible with palpation, no redness or warmth noted to the back, no soft tissue swelling, ecchymosis or other lesions, no rash EXT: Normal ROM in all joints; no deformity noted, no edema; no cyanosis SKIN: Normal color for age and race; warm; no rash on exposed skin NEURO: Moves all extremities equally PSYCH: The patient's  mood and manner are appropriate.  ____________________________________________   LABS (all labs ordered are listed, but only abnormal results are displayed)  Labs Reviewed  CBC WITH DIFFERENTIAL/PLATELET - Abnormal; Notable for the following components:      Result Value   WBC 12.6 (*)    Hemoglobin 15.6 (*)    Lymphs Abs 4.9 (*)    Abs Immature Granulocytes 0.08 (*)    All other components within normal limits  COMPREHENSIVE METABOLIC PANEL - Abnormal; Notable for the following components:   ALT 61 (*)    All other components within normal limits  URINALYSIS, ROUTINE W REFLEX MICROSCOPIC - Abnormal; Notable for the following components:   Color, Urine YELLOW (*)    APPearance HAZY (*)    Hgb urine dipstick MODERATE (*)    All other components within normal limits  D-DIMER, QUANTITATIVE - Abnormal; Notable for the following components:   D-Dimer, Quant 0.66 (*)    All other components within normal limits  RESP PANEL BY RT-PCR (FLU A&B, COVID) ARPGX2  LIPASE, BLOOD  POC URINE PREG, ED  TROPONIN I (HIGH SENSITIVITY)    ____________________________________________  EKG   EKG Interpretation  Date/Time:  Tuesday Nov 13 2020 02:55:24 EDT Ventricular Rate:  91 PR Interval:  172 QRS Duration: 84 QT Interval:  338 QTC Calculation: 416 R Axis:   63 Text Interpretation: Sinus rhythm Probable left atrial enlargement Borderline T abnormalities, anterior leads Confirmed by Rochele RaringWard, Jestine Bicknell [phone]([id number]) on 11/13/2020 3:03:23 AM       ____________________________________________  RADIOLOGY Normajean BaxterI, [place], personally viewed and evaluated these images (plain radiographs) as part of my medical decision making, as well as reviewing the written report by the radiologist.  ED MD interpretation: No pneumonia.  Normal gallbladder.  Normal appendix.  Official radiology report(s): DG Chest 2 View  Result Date: 11/13/2020 CLINICAL DATA:  Cough and upper thoracic back pain. EXAM: CHEST - 2 VIEW COMPARISON:  November 03, 2020 FINDINGS: There is no evidence of acute infiltrate, pleural effusion or pneumothorax. The heart size and mediastinal contours are within normal limits. Bilateral radiopaque nipple piercings are seen. The visualized skeletal structures are unremarkable. IMPRESSION: No active cardiopulmonary disease. Electronically Signed   By: Aram Candelahaddeus  Houston M.D.   On: 11/13/2020 03:12   CT Angio Chest PE W and/or Wo Contrast  Result Date: 11/13/2020 CLINICAL DATA:  Pneumonia diagnosed 11/03/2020, consistent cough now with upper thoracic pain radiating across the back and ribs, additional right upper quadrant and right flank pain. EXAM: CT ANGIOGRAPHY CHEST CT ABDOMEN AND PELVIS WITH CONTRAST TECHNIQUE: Multidetector CT imaging of the chest was performed using the standard protocol during bolus administration of intravenous contrast. Multiplanar CT image reconstructions and MIPs were obtained to evaluate the vascular anatomy. Multidetector CT imaging of the abdomen and pelvis was performed using the standard protocol during  bolus administration of intravenous contrast. CONTRAST:  100mL OMNIPAQUE IOHEXOL 350 MG/ML SOLN COMPARISON:  Radiograph 11/13/2020, 10/14/2020 CT renal colic 03/29/2019, CT a chest, abdomen and pelvis 12/17/2015 FINDINGS: CTA CHEST FINDINGS Cardiovascular: Satisfactory opacification the pulmonary arteries to the segmental level. No pulmonary artery filling defects are identified. Central pulmonary arteries are normal caliber. Normal heart size. No pericardial effusion. The aortic root is suboptimally assessed given cardiac pulsation artifact. The aorta is normal caliber. No acute luminal abnormality of the imaged aorta. No periaortic stranding or hemorrhage. Normal 3 vessel branching of the aortic arch. Proximal great vessels are unremarkable. No major venous abnormalities are significant venous reflux. Mediastinum/Nodes: No  mediastinal fluid or gas. Normal thyroid gland and thoracic inlet. No acute abnormality of the trachea or esophagus. No worrisome mediastinal, hilar or axillary adenopathy. Lungs/Pleura: Atelectatic changes, likely accentuated by imaging during exhalation. No consolidation, features of edema, pneumothorax, or effusion. No suspicious pulmonary nodules or masses. Musculoskeletal: No chest wall abnormality. No acute or significant osseous findings. Review of the MIP images confirms the above findings. CT ABDOMEN and PELVIS FINDINGS Hepatobiliary: No worrisome focal liver lesions. Smooth liver surface contour. Normal hepatic attenuation. Gallbladder partially decompressed at time of exam. No significant gallbladder wall thickening or pericholecystic fluid or inflammation. No visible calcified gallstones or biliary ductal dilatation. Pancreas: No pancreatic ductal dilatation or surrounding inflammatory changes. Spleen: Normal in size. No concerning splenic lesions. Adrenals/Urinary Tract: Normal adrenal glands. Kidneys are normally located with symmetric enhancement. Some mild cortical scarring noted  in the right kidney. A right extrarenal pelvis is noted present on comparison. No hydronephrosis or obstructive urolithiasis. No suspicious renal lesions. No significant perinephric stranding. Urinary bladder is largely decompressed at the time of exam and therefore poorly evaluated by CT imaging. No gross bladder abnormality accounting for underdistention. Stomach/Bowel: Distal esophagus, stomach and duodenal sweep are unremarkable. No small bowel wall thickening or dilatation. No evidence of obstruction. A normal appendix is visualized. No colonic dilatation or wall thickening. Vascular/Lymphatic: No significant vascular findings are present. No enlarged abdominal or pelvic lymph nodes. Reproductive: Anteverted uterus. Radiopaque IUD, previously well positioned, now appears displaced and rotated within the lower uterine segment. No concerning adnexal masses or lesions. Other: No abdominopelvic free fluid or free gas. No bowel containing hernias. Musculoskeletal: No acute osseous abnormality or suspicious osseous lesion. Musculature is normal and symmetric. Mild posterior body wall edema. Review of the MIP images confirms the above findings. IMPRESSION: 1. No evidence of pulmonary artery embolus. 2. Atelectatic changes in the lungs, likely accentuated by imaging during exhalation for angiographic technique. 3. No other acute intrathoracic abnormality. 4. Displaced and rotated IUD within the lower uterine segment. 5. No other acute abdominopelvic abnormality or findings to provide a cause for patient's symptoms. 6. Mild cortical scarring on the right with an extrarenal pelvis, present on comparison. Electronically Signed   By: Kreg Shropshire M.D.   On: 11/13/2020 05:18   CT ABDOMEN PELVIS W CONTRAST  Result Date: 11/13/2020 CLINICAL DATA:  Pneumonia diagnosed 11/03/2020, consistent cough now with upper thoracic pain radiating across the back and ribs, additional right upper quadrant and right flank pain. EXAM: CT  ANGIOGRAPHY CHEST CT ABDOMEN AND PELVIS WITH CONTRAST TECHNIQUE: Multidetector CT imaging of the chest was performed using the standard protocol during bolus administration of intravenous contrast. Multiplanar CT image reconstructions and MIPs were obtained to evaluate the vascular anatomy. Multidetector CT imaging of the abdomen and pelvis was performed using the standard protocol during bolus administration of intravenous contrast. CONTRAST:  OMNIPAQUE IOHEXOL 350 MG/ML SOLN COMPARISON:  Radiograph 11/13/2020, 10/14/2020 CT renal colic 03/29/2019, CT a chest, abdomen and pelvis 12/17/2015 FINDINGS: CTA CHEST FINDINGS Cardiovascular: Satisfactory opacification the pulmonary arteries to the segmental level. No pulmonary artery filling defects are identified. Central pulmonary arteries are normal caliber. Normal heart size. No pericardial effusion. The aortic root is suboptimally assessed given cardiac pulsation artifact. The aorta is normal caliber. No acute luminal abnormality of the imaged aorta. No periaortic stranding or hemorrhage. Normal 3 vessel branching of the aortic arch. Proximal great vessels are unremarkable. No major venous abnormalities are significant venous reflux. Mediastinum/Nodes: No mediastinal fluid or gas.  Normal thyroid gland and thoracic inlet. No acute abnormality of the trachea or esophagus. No worrisome mediastinal, hilar or axillary adenopathy. Lungs/Pleura: Atelectatic changes, likely accentuated by imaging during exhalation. No consolidation, features of edema, pneumothorax, or effusion. No suspicious pulmonary nodules or masses. Musculoskeletal: No chest wall abnormality. No acute or significant osseous findings. Review of the MIP images confirms the above findings. CT ABDOMEN and PELVIS FINDINGS Hepatobiliary: No worrisome focal liver lesions. Smooth liver surface contour. Normal hepatic attenuation. Gallbladder partially decompressed at time of exam. No significant gallbladder  wall thickening or pericholecystic fluid or inflammation. No visible calcified gallstones or biliary ductal dilatation. Pancreas: No pancreatic ductal dilatation or surrounding inflammatory changes. Spleen: Normal in size. No concerning splenic lesions. Adrenals/Urinary Tract: Normal adrenal glands. Kidneys are normally located with symmetric enhancement. Some mild cortical scarring noted in the right kidney. A right extrarenal pelvis is noted present on comparison. No hydronephrosis or obstructive urolithiasis. No suspicious renal lesions. No significant perinephric stranding. Urinary bladder is largely decompressed at the time of exam and therefore poorly evaluated by CT imaging. No gross bladder abnormality accounting for underdistention. Stomach/Bowel: Distal esophagus, stomach and duodenal sweep are unremarkable. No small bowel wall thickening or dilatation. No evidence of obstruction. A normal appendix is visualized. No colonic dilatation or wall thickening. Vascular/Lymphatic: No significant vascular findings are present. No enlarged abdominal or pelvic lymph nodes. Reproductive: Anteverted uterus. Radiopaque IUD, previously well positioned, now appears displaced and rotated within the lower uterine segment. No concerning adnexal masses or lesions. Other: No abdominopelvic free fluid or free gas. No bowel containing hernias. Musculoskeletal: No acute osseous abnormality or suspicious osseous lesion. Musculature is normal and symmetric. Mild posterior body wall edema. Review of the MIP images confirms the above findings. IMPRESSION: 1. No evidence of pulmonary artery embolus. 2. Atelectatic changes in the lungs, likely accentuated by imaging during exhalation for angiographic technique. 3. No other acute intrathoracic abnormality. 4. Displaced and rotated IUD within the lower uterine segment. 5. No other acute abdominopelvic abnormality or findings to provide a cause for patient's symptoms. 6. Mild cortical  scarring on the right with an extrarenal pelvis, present on comparison. Electronically Signed   By: Kreg Shropshire M.D.   On: 11/13/2020 05:18    ____________________________________________   PROCEDURES  Procedure(s) performed (including Critical Care):  Procedures   ____________________________________________   INITIAL IMPRESSION / ASSESSMENT AND PLAN / ED COURSE  As part of my medical decision making, I reviewed the following data within the electronic MEDICAL RECORD NUMBER Nursing notes reviewed and incorporated, Labs reviewed , EKG interpreted , Old EKG reviewed, Old chart reviewed, Radiograph reviewed  and Notes from prior ED visits         Patient here with complaints of flank pain.  Differential includes musculoskeletal pain from coughing, pneumonia, pneumothorax, PE, kidney stone, pyelonephritis, biliary colic, cholecystitis, pancreatitis.  Will obtain labs, chest x-ray, EKG, urinalysis and CT of the abdomen pelvis.  Will give Toradol for pain.  Doubt ACS, dissection.  ED PROGRESS  4:00 AM  Patient's labs show leukocytosis with predominant lymphocytes.  D-dimer elevated.  Troponin normal.  COVID and flu negative.  Urine shows 6-10 white blood cells but no other sign of infection, no blood.  Chest x-ray shows resolution of left lower lobe pneumonia.  Will obtain CT of the chest as well as the abdomen pelvis.  5:24 AM  Pt reports feeling better.  CT of the chest, abdomen pelvis showed no acute abnormality.  No PE, infiltrate  or edema.  Normal-appearing gallbladder, appendix.  No kidney stones, signs of pyelonephritis.  Suspect musculoskeletal pain as the cause of her flank pain today.  Recommended alternating Tylenol and Motrin for pain and will discharge with muscle relaxers.  Patient verbalized understanding and is comfortable with this plan.  At this time, I do not feel there is any life-threatening condition present. I have reviewed, interpreted and discussed all results (EKG,  imaging, lab, urine as appropriate) and exam findings with patient/family. I have reviewed nursing notes and appropriate previous records.  I feel the patient is safe to be discharged home without further emergent workup and can continue workup as an outpatient as needed. Discussed usual and customary return precautions. Patient/family verbalize understanding and are comfortable with this plan.  Outpatient follow-up has been provided as needed. All questions have been answered.  ____________________________________________   FINAL CLINICAL IMPRESSION(S) / ED DIAGNOSES  Final diagnoses:  Cough  Right flank pain     ED Discharge Orders         Ordered    ibuprofen (ADVIL) 800 MG tablet  Every 8 hours PRN        11/13/20 0527    methocarbamol (ROBAXIN) 500 MG tablet  Every 8 hours PRN        11/13/20 0527          *Please note:  RINA ADNEY was evaluated in Emergency Department on 11/13/2020 for the symptoms described in the history of present illness. She was evaluated in the context of the global COVID-19 pandemic, which necessitated consideration that the patient might be at risk for infection with the SARS-CoV-2 virus that causes COVID-19. Institutional protocols and algorithms that pertain to the evaluation of patients at risk for COVID-19 are in a state of rapid change based on information released by regulatory bodies including the CDC and federal and state organizations. These policies and algorithms were followed during the patient's care in the ED.  Some ED evaluations and interventions may be delayed as a result of limited staffing during and the pandemic.*   Note:  This document was prepared using Dragon voice recognition software and may include unintentional dictation errors.   Taurean Ju, Layla Maw, DO 11/13/20 [phone]

## 2020-11-13 NOTE — ED Notes (Signed)
Pt ambulatory to POV without difficulty. VSS. NAD. All questions and concerns addressed.

## 2020-11-13 NOTE — ED Notes (Signed)
Patient transported to CT 

## 2020-11-14 ENCOUNTER — Telehealth: Payer: Self-pay | Admitting: *Deleted

## 2020-11-14 NOTE — Telephone Encounter (Signed)
Transition Care Management Follow-up Telephone Call  Date of discharge and from where: 11/13/2020 [facility]  How have you been since you were released from the hospital? "A little better"  Any questions or concerns? No  Items Reviewed:  Did the pt receive and understand the discharge instructions provided? Yes   Medications obtained and verified? Yes   Other? No   Any new allergies since your discharge? No   Dietary orders reviewed? No  Do you have support at home? Yes    Functional Questionnaire: (I = Independent and D = Dependent) ADLs: I  Bathing/Dressing- I  Meal Prep- I  Eating- I  Maintaining continence- I  Transferring/Ambulation- I  Managing Meds- I  Follow up appointments reviewed:   PCP Hospital f/u appt confirmed? No  - PCP outside of [facility] f/u appt confirmed? No   Are transportation arrangements needed? N/A  If their condition worsens, is the pt aware to call PCP or go to the Emergency Dept.? Yes  Was the patient provided with contact information for the PCP's office or ED? Yes  Was to pt encouraged to call back with questions or concerns? Yes

## 2020-12-04 ENCOUNTER — Encounter: Payer: Medicaid Other | Admitting: Obstetrics and Gynecology

## 2020-12-05 ENCOUNTER — Encounter: Payer: Self-pay | Admitting: Obstetrics and Gynecology

## 2020-12-07 ENCOUNTER — Ambulatory Visit (INDEPENDENT_AMBULATORY_CARE_PROVIDER_SITE_OTHER): Payer: Medicaid Other | Admitting: Obstetrics and Gynecology

## 2020-12-07 ENCOUNTER — Other Ambulatory Visit: Payer: Self-pay

## 2020-12-07 ENCOUNTER — Encounter: Payer: Self-pay | Admitting: Obstetrics and Gynecology

## 2020-12-07 VITALS — BP 128/80 | Ht 59.0 in | Wt 223.0 lb

## 2020-12-07 DIAGNOSIS — Z975 Presence of (intrauterine) contraceptive device: Secondary | ICD-10-CM | POA: Diagnosis not present

## 2020-12-07 DIAGNOSIS — Z30432 Encounter for removal of intrauterine contraceptive device: Secondary | ICD-10-CM

## 2020-12-07 DIAGNOSIS — Z30016 Encounter for initial prescription of transdermal patch hormonal contraceptive device: Secondary | ICD-10-CM

## 2020-12-07 DIAGNOSIS — N938 Other specified abnormal uterine and vaginal bleeding: Secondary | ICD-10-CM

## 2020-12-07 MED ORDER — XULANE 150-35 MCG/24HR TD PTWK: 1.0000 | MEDICATED_PATCH | TRANSDERMAL | 4 refills | Status: DC

## 2020-12-07 NOTE — Progress Notes (Signed)
Obstetrics & Gynecology Office Visit   Chief Complaint  Patient presents with  . IUD check    U/S showed displaced IUD, pt has had 2 recent heavy bleeding occassions, some pain    History of Present Illness: 25 y.o. G70P2002 female who presents in follow up from an ER visit on 11/13/2020. She was diagnosed with PNA and she had a pelvic CT and it showed that her IUD was displaced.  Her IUD was placed 11/2018. With the IUD she has had irregular bleeding. She is considering conception.   Patient is a 25 y.o. [id number] presenting for contraception consult.  She is currently on IUD and desiring to start Ortho-Evra patches weekly.  She has a past medical history significant for no contraindication to estrogen.  She specifically denies a history of migraine with aura, chronic hypertension and history of DVT/PE.  Reported No LMP recorded. (Menstrual status: IUD)..    Past Medical History:  Diagnosis Date  . Abnormal Pap smear of cervix   . Chronic kidney disease    CHRONIC  RIGHT PYELONEPHRITIS  . Family history of adverse reaction to anesthesia    PTS MOM WENT INTO [facility] AFTER HYSTERECTOMY  . Heart murmur   . History of lithotripsy   . HPV in female   . Hypothyroidism   . Kidney stone   . Thyroid goiter     Past Surgical History:  Procedure Laterality Date  . EXTRACORPOREAL SHOCK WAVE LITHOTRIPSY Left 04/14/2019   Procedure: EXTRACORPOREAL SHOCK WAVE LITHOTRIPSY (ESWL);  Surgeon: Riki Altes, MD;  Location: [facility];  Service: Urology;  Laterality: Left;  . EXTRACORPOREAL SHOCK WAVE LITHOTRIPSY Right 05/05/2019   Procedure: EXTRACORPOREAL SHOCK WAVE LITHOTRIPSY (ESWL);  Surgeon: Riki Altes, MD;  Location: [facility];  Service: Urology;  Laterality: Right;  . LEEP N/A 05/03/2018   Procedure: LOOP ELECTROSURGICAL EXCISION PROCEDURE (LEEP);  Surgeon: Herold Harms, MD;  Location: [facility];  Service: Gynecology;  Laterality: N/A;  . REMOVAL OF DRUG DELIVERY IMPLANT Left 12/15/2014    Procedure: REMOVAL OF DRUG DELIVERY IMPLANT;  Surgeon: Nadara Mustard, MD;  Location: [facility];  Service: Gynecology;  Laterality: Left;    Gynecologic History: No LMP recorded. (Menstrual status: IUD).  Obstetric History: [id number]  Family History  Problem Relation Age of Onset  . Hepatitis C Mother   . Hepatitis C Father   . Breast cancer Neg Hx   . Ovarian cancer Neg Hx   . Colon cancer Neg Hx     Social History   Socioeconomic History  . Marital status: Single    Spouse name: Not on file  . Number of children: Not on file  . Years of education: Not on file  . Highest education level: Not on file  Occupational History  . Not on file  Tobacco Use  . Smoking status: Current Every Day Smoker    Packs/day: 1.00    Years: 5.00    Pack years: 5.00    Types: Cigarettes  . Smokeless tobacco: Never Used  Vaping Use  . Vaping Use: Never used  Substance and Sexual Activity  . Alcohol use: Yes    Comment: occasional   . Drug use: No  . Sexual activity: Yes    Birth control/protection: I.U.D.    Comment: Mirena  Other Topics Concern  . Not on file  Social History Narrative  . Not on file   Social Determinants of Health   Financial Resource Strain: Not on file  Food  Insecurity: Not on file  Transportation Needs: Not on file  Physical Activity: Not on file  Stress: Not on file  Social Connections: Not on file  Intimate Partner Violence: Not on file    Allergies  Allergen Reactions  . Kiwi Extract Anaphylaxis  . Amoxicillin Hives, Rash and Other (See Comments)    Blisters Has patient had a PCN reaction causing immediate rash, facial/tongue/throat swelling, SOB or lightheadedness with hypotension: No Has patient had a PCN reaction causing severe rash involving mucus membranes or skin necrosis: Yes Has patient had a PCN reaction that required hospitalization: No Has patient had a PCN reaction occurring within the last 10 years: Unknown If all of the above answers  are "NO", then may proceed with Cephalosporin use.   . Ciprofloxacin Hives    blisters  . Penicillins Rash and Other (See Comments)    Has patient had a PCN reaction causing immediate rash, facial/tongue/throat swelling, SOB or lightheadedness with hypotension:no Has patient had a PCN reaction causing severe rash involving mucus membranes or skin necrosis: yes Has patient had a PCN reaction that required hospitalization no Has patient had a PCN reaction occurring within the last 10 years: yes If all of the above answers are "NO", then may proceed with Cephalosporin use.   . Tramadol Itching  . Cranberry Rash  . Latex Rash  . Sulfa Antibiotics Rash and Other (See Comments)    BLISTERS    Prior to Admission medications   Medication Sig Start Date End Date Taking? Authorizing Provider  clobetasol cream (TEMOVATE) 0.05 % Apply 1 application topically 2 (two) times daily. 08/08/20   Junie Spencer, FNP  levonorgestrel (MIRENA) 20 MCG/24HR IUD 1 each by Intrauterine route once.    [provider]  methocarbamol (ROBAXIN) 500 MG tablet Take 1 tablet (500 mg total) by mouth every 8 (eight) hours as needed for muscle spasms. 11/13/20   Ward, Layla Maw, DO  promethazine (PHENERGAN) 25 MG tablet Take 1 tablet (25 mg total) by mouth every 8 (eight) hours as needed for nausea or vomiting. 10/18/20 11/13/20  Couture, Cortni S, PA-C    Review of Systems  Constitutional: Negative.   HENT: Negative.   Eyes: Negative.   Respiratory: Negative.   Cardiovascular: Negative.   Gastrointestinal: Negative.   Genitourinary: Negative.   Musculoskeletal: Negative.   Skin: Negative.   Neurological: Negative.   Psychiatric/Behavioral: Negative.      Physical Exam BP 128/80   Ht 4\' 11"  (1.499 m)   Wt 223 lb (101.2 kg)   BMI 45.04 kg/m  No LMP recorded. (Menstrual status: IUD). Physical Exam Constitutional:      General: She is not in acute distress.    Appearance: Normal appearance.   Genitourinary:     Bladder and urethral meatus normal.     No lesions in the vagina.     Right Labia: No rash, tenderness, lesions or skin changes.    Left Labia: No tenderness, lesions, skin changes or rash.    No inguinal adenopathy present in the right or left side.    Pelvic Tanner Score: 5/5.    No vaginal discharge, erythema, bleeding or ulceration.     No vaginal prolapse present.     Right Adnexa: not tender, not full and no mass present.    Left Adnexa: not tender, not full and no mass present.    No cervical motion tenderness, discharge, friability, lesion or polyp.     IUD strings visualized.  Uterus is not enlarged, fixed or tender.     Uterus is anteverted.  HENT:     Head: Normocephalic and atraumatic.  Eyes:     General: No scleral icterus.    Conjunctiva/sclera: Conjunctivae normal.  Lymphadenopathy:     Lower Body: No right inguinal adenopathy. No left inguinal adenopathy.  Neurological:     General: No focal deficit present.     Mental Status: She is alert and oriented to person, place, and time.     Cranial Nerves: No cranial nerve deficit.  Psychiatric:        Mood and Affect: Mood normal.        Behavior: Behavior normal.        Judgment: Judgment normal.   IUD Removal  Patient identified, informed consent performed, consent signed.  Patient was in the dorsal lithotomy position, normal external genitalia was noted.  A speculum was placed in the patient's vagina, normal discharge was noted, no lesions. The cervix was visualized, no lesions, no abnormal discharge.  The strings of the IUD were grasped and pulled using ring forceps. The IUD was removed in its entirety. Patient tolerated the procedure well.    Patient will use the patch for contraception Routine preventative health maintenance measures emphasized.  Female chaperone present for pelvic and breast  portions of the physical exam  Assessment: 25 y.o. G15P2002 female here for  1. Encounter for  removal of intrauterine contraceptive device (IUD)   2. Encounter for initial prescription of transdermal patch hormonal contraceptive device      Plan: Problem List Items Addressed This Visit   None   Visit Diagnoses    Encounter for removal of intrauterine contraceptive device (IUD)    -  Primary   Encounter for initial prescription of transdermal patch hormonal contraceptive device       Relevant Medications   norelgestromin-ethinyl estradiol Burr Medico) 150-35 MCG/24HR transdermal patch     Reviewed all forms of birth control options available including abstinence; over the counter/barrier methods; hormonal contraceptive medication including pill, patch, ring, injection,contraceptive implant; hormonal and nonhormonal IUDs; permanent sterilization options including vasectomy and the various tubal sterilization modalities. Risks and benefits reviewed.  Questions were answered.  IUD removed and Rx for Xulane. Follow up with routine gynecologic care.  A total of 33 minutes were spent face-to-face with the patient as well as preparation, review, communication, and documentation during this encounter.    Thomasene Mohair, MD 12/07/2020 2:24 PM

## 2020-12-12 DIAGNOSIS — Z419 Encounter for procedure for purposes other than remedying health state, unspecified: Secondary | ICD-10-CM | POA: Diagnosis not present

## 2020-12-24 ENCOUNTER — Telehealth: Payer: Medicaid Other | Admitting: Physician Assistant

## 2020-12-24 DIAGNOSIS — H60391 Other infective otitis externa, right ear: Secondary | ICD-10-CM | POA: Diagnosis not present

## 2020-12-24 MED ORDER — NEOMYCIN-POLYMYXIN-HC 1 % OT SOLN: 3.0000 [drp] | Freq: Four times a day (QID) | OTIC | 0 refills | Status: DC

## 2020-12-24 NOTE — Progress Notes (Signed)
E Visit for Swimmer's Ear  We are sorry that you are not feeling well. Here is how we plan to help!  I have prescribed: Neomycin 0.35%, polymyxin B 10,000 units/mL, and hydrocortisone 0,5% otic solution 4 drops in affected ears four times a day for 7 days  In certain cases swimmer's ear may progress to a more serious bacterial infection of the middle or inner ear.  If you have a fever 102 and up and significantly worsening symptoms, this could indicate a more serious infection moving to the middle/inner and needs face to face evaluation in an office by a provider.  Your symptoms should improve over the next 3 days and should resolve in about 7 days.  HOME CARE:  Wash your hands frequently. Do not place the tip of the bottle on your ear or touch it with your fingers. You can take Acetominophen 650 mg every 4-6 hours as needed for pain.  If pain is severe or moderate, you can apply a heating pad (set on low) or hot water bottle (wrapped in a towel) to outer ear for 20 minutes.  This will also increase drainage. Avoid ear plugs Do not use Q-tips After showers, help the water run out by tilting your head to one side.  GET HELP RIGHT AWAY IF:  Fever is over 102.2 degrees. You develop progressive ear pain or hearing loss. Ear symptoms persist longer than 3 days after treatment.  MAKE SURE YOU:  Understand these instructions. Will watch your condition. Will get help right away if you are not doing well or get worse.  TO PREVENT SWIMMER'S EAR: Use a bathing cap or custom fitted swim molds to keep your ears dry. Towel off after swimming to dry your ears. Tilt your head or pull your earlobes to allow the water to escape your ear canal. If there is still water in your ears, consider using a hairdryer on the lowest setting.  Thank you for choosing an e-visit. Your e-visit answers were reviewed by a board certified advanced clinical practitioner to complete your personal care plan. Depending  upon the condition, your plan could have included both over the counter or prescription medications. Please review your pharmacy choice. Be sure that the pharmacy you have chosen is open so that you can pick up your prescription now.  If there is a problem you may message your provider in MyChart to have the prescription routed to another pharmacy. Your safety is important to [place]. If you have drug allergies check your prescription carefully.  For the next 24 hours, you can use MyChart to ask questions about today's visit, request a non-urgent call back, or ask for a work or school excuse from your e-visit provider. You will get an email in the next two days asking about your experience. I hope that your e-visit has been valuable and will speed your recovery.  I provided 5 minutes of non face-to-face time during this encounter for chart review and documentation.

## 2021-01-11 DIAGNOSIS — Z419 Encounter for procedure for purposes other than remedying health state, unspecified: Secondary | ICD-10-CM | POA: Diagnosis not present

## 2021-02-11 DIAGNOSIS — M7742 Metatarsalgia, left foot: Secondary | ICD-10-CM | POA: Diagnosis not present

## 2021-02-11 DIAGNOSIS — Z419 Encounter for procedure for purposes other than remedying health state, unspecified: Secondary | ICD-10-CM | POA: Diagnosis not present

## 2021-02-11 DIAGNOSIS — B070 Plantar wart: Secondary | ICD-10-CM | POA: Diagnosis not present

## 2021-02-11 DIAGNOSIS — Z6841 Body Mass Index (BMI) 40.0 and over, adult: Secondary | ICD-10-CM | POA: Diagnosis not present

## 2021-03-06 ENCOUNTER — Telehealth: Payer: Medicaid Other | Admitting: Nurse Practitioner

## 2021-03-06 DIAGNOSIS — J069 Acute upper respiratory infection, unspecified: Secondary | ICD-10-CM | POA: Diagnosis not present

## 2021-03-06 MED ORDER — BENZONATATE 100 MG PO CAPS: 100.0000 mg | ORAL_CAPSULE | Freq: Three times a day (TID) | ORAL | 0 refills | Status: DC | PRN

## 2021-03-06 MED ORDER — FLUTICASONE PROPIONATE 50 MCG/ACT NA SUSP: 2.0000 | Freq: Every day | NASAL | 6 refills | Status: DC

## 2021-03-06 NOTE — Progress Notes (Signed)
E-Visit for Upper Respiratory Infection   We are sorry you are not feeling well.  Here is how we plan to help!   You really need to be covid tested. If it comes back positive you will need to then do a video visit for antiviral treatment.  Based on what you have shared with me, it looks like you may have a viral upper respiratory infection.  Upper respiratory infections are caused by a large number of viruses; however, rhinovirus is the most common cause.   Symptoms vary from person to person, with common symptoms including sore throat, cough, fatigue or lack of energy and feeling of general discomfort.  A low-grade fever of up to 100.4 may present, but is often uncommon.  Symptoms vary however, and are closely related to a person's age or underlying illnesses.  The most common symptoms associated with an upper respiratory infection are nasal discharge or congestion, cough, sneezing, headache and pressure in the ears and face.  These symptoms usually persist for about 3 to 10 days, but can last up to 2 weeks.  It is important to know that upper respiratory infections do not cause serious illness or complications in most cases.    Upper respiratory infections can be transmitted from person to person, with the most common method of transmission being a person's hands.  The virus is able to live on the skin and can infect other persons for up to 2 hours after direct contact.  Also, these can be transmitted when someone coughs or sneezes; thus, it is important to cover the mouth to reduce this risk.  To keep the spread of the illness at bay, good hand hygiene is very important.  This is an infection that is most likely caused by a virus. There are no specific treatments other than to help you with the symptoms until the infection runs its course.  We are sorry you are not feeling well.  Here is how we plan to help!   For nasal congestion, you may use an oral decongestants such as Mucinex D or if you have  glaucoma or high blood pressure use plain Mucinex.  Saline nasal spray or nasal drops can help and can safely be used as often as needed for congestion.  For your congestion, I have prescribed Fluticasone nasal spray one spray in each nostril twice a day  If you do not have a history of heart disease, hypertension, diabetes or thyroid disease, prostate/bladder issues or glaucoma, you may also use Sudafed to treat nasal congestion.  It is highly recommended that you consult with a pharmacist or your primary care physician to ensure this medication is safe for you to take.     If you have a cough, you may use cough suppressants such as Delsym and Robitussin.  If you have glaucoma or high blood pressure, you can also use Coricidin HBP.   For cough I have prescribed for you A prescription cough medication called Tessalon Perles 100 mg. You may take 1-2 capsules every 8 hours as needed for cough  If you have a sore or scratchy throat, use a saltwater gargle-  to  teaspoon of salt dissolved in a 4-ounce to 8-ounce glass of warm water.  Gargle the solution for approximately 15-30 seconds and then spit.  It is important not to swallow the solution.  You can also use throat lozenges/cough drops and Chloraseptic spray to help with throat pain or discomfort.  Warm or cold liquids can also  be helpful in relieving throat pain.  For headache, pain or general discomfort, you can use Ibuprofen or Tylenol as directed.   Some authorities believe that zinc sprays or the use of Echinacea may shorten the course of your symptoms.   HOME CARE Only take medications as instructed by your medical team. Be sure to drink plenty of fluids. Water is fine as well as fruit juices, sodas and electrolyte beverages. You may want to stay away from caffeine or alcohol. If you are nauseated, try taking small sips of liquids. How do you know if you are getting enough fluid? Your urine should be a pale yellow or almost colorless. Get  rest. Taking a steamy shower or using a humidifier may help nasal congestion and ease sore throat pain. You can place a towel over your head and breathe in the steam from hot water coming from a faucet. Using a saline nasal spray works much the same way. Cough drops, hard candies and sore throat lozenges may ease your cough. Avoid close contacts especially the very young and the elderly Cover your mouth if you cough or sneeze Always remember to wash your hands.   GET HELP RIGHT AWAY IF: You develop worsening fever. If your symptoms do not improve within 10 days You develop yellow or green discharge from your nose over 3 days. You have coughing fits You develop a severe head ache or visual changes. You develop shortness of breath, difficulty breathing or start having chest pain Your symptoms persist after you have completed your treatment plan  MAKE SURE YOU  Understand these instructions. Will watch your condition. Will get help right away if you are not doing well or get worse.  Thank you for choosing an e-visit.  Your e-visit answers were reviewed by a board certified advanced clinical practitioner to complete your personal care plan. Depending upon the condition, your plan could have included both over the counter or prescription medications.  Please review your pharmacy choice. Make sure the pharmacy is open so you can pick up prescription now. If there is a problem, you may contact your provider through [employer] and have the prescription routed to another pharmacy.  Your safety is important to [place]. If you have drug allergies check your prescription carefully.   For the next 24 hours you can use MyChart to ask questions about today's visit, request a non-urgent call back, or ask for a work or school excuse. You will get an email in the next two days asking about your experience. I hope that your e-visit has been valuable and will speed your recovery.   5-10 minutes spent  reviewing and documenting in chart.

## 2021-03-14 DIAGNOSIS — Z419 Encounter for procedure for purposes other than remedying health state, unspecified: Secondary | ICD-10-CM | POA: Diagnosis not present

## 2021-03-26 ENCOUNTER — Ambulatory Visit (LOCAL_COMMUNITY_HEALTH_CENTER): Payer: Medicaid Other

## 2021-03-26 ENCOUNTER — Encounter: Payer: Self-pay | Admitting: Emergency Medicine

## 2021-03-26 ENCOUNTER — Other Ambulatory Visit: Payer: Self-pay

## 2021-03-26 VITALS — BP 132/86 | Ht 59.0 in | Wt 223.5 lb

## 2021-03-26 DIAGNOSIS — F17210 Nicotine dependence, cigarettes, uncomplicated: Secondary | ICD-10-CM | POA: Insufficient documentation

## 2021-03-26 DIAGNOSIS — Z3201 Encounter for pregnancy test, result positive: Secondary | ICD-10-CM

## 2021-03-26 DIAGNOSIS — Z3A01 Less than 8 weeks gestation of pregnancy: Secondary | ICD-10-CM | POA: Insufficient documentation

## 2021-03-26 DIAGNOSIS — Z91040 Latex allergy status: Secondary | ICD-10-CM | POA: Diagnosis not present

## 2021-03-26 DIAGNOSIS — N9489 Other specified conditions associated with female genital organs and menstrual cycle: Secondary | ICD-10-CM | POA: Insufficient documentation

## 2021-03-26 DIAGNOSIS — E039 Hypothyroidism, unspecified: Secondary | ICD-10-CM | POA: Insufficient documentation

## 2021-03-26 DIAGNOSIS — O209 Hemorrhage in early pregnancy, unspecified: Secondary | ICD-10-CM | POA: Insufficient documentation

## 2021-03-26 DIAGNOSIS — N939 Abnormal uterine and vaginal bleeding, unspecified: Secondary | ICD-10-CM | POA: Diagnosis not present

## 2021-03-26 DIAGNOSIS — R58 Hemorrhage, not elsewhere classified: Secondary | ICD-10-CM

## 2021-03-26 LAB — CBC WITH DIFFERENTIAL/PLATELET
Abs Immature Granulocytes: 0.05 10*3/uL (ref 0.00–0.07)
Basophils Absolute: 0.1 10*3/uL (ref 0.0–0.1)
Basophils Relative: 1 %
Eosinophils Absolute: 0.1 10*3/uL (ref 0.0–0.5)
Eosinophils Relative: 1 %
HCT: 40.4 % (ref 36.0–46.0)
Hemoglobin: 14.9 g/dL (ref 12.0–15.0)
Immature Granulocytes: 0 %
Lymphocytes Relative: 29 %
Lymphs Abs: 3.3 10*3/uL (ref 0.7–4.0)
MCH: 35.7 pg — ABNORMAL HIGH (ref 26.0–34.0)
MCHC: 36.9 g/dL — ABNORMAL HIGH (ref 30.0–36.0)
MCV: 96.9 fL (ref 80.0–100.0)
Monocytes Absolute: 0.6 10*3/uL (ref 0.1–1.0)
Monocytes Relative: 5 %
Neutro Abs: 7.1 10*3/uL (ref 1.7–7.7)
Neutrophils Relative %: 64 %
Platelets: 288 10*3/uL (ref 150–400)
RBC: 4.17 MIL/uL (ref 3.87–5.11)
RDW: 11.3 % — ABNORMAL LOW (ref 11.5–15.5)
WBC: 11.2 10*3/uL — ABNORMAL HIGH (ref 4.0–10.5)
nRBC: 0 % (ref 0.0–0.2)

## 2021-03-26 LAB — PREGNANCY, URINE: Preg Test, Ur: POSITIVE — AB

## 2021-03-26 LAB — URINALYSIS, ROUTINE W REFLEX MICROSCOPIC
Bilirubin Urine: NEGATIVE
Glucose, UA: NEGATIVE mg/dL
Hgb urine dipstick: NEGATIVE
Ketones, ur: NEGATIVE mg/dL
Leukocytes,Ua: NEGATIVE
Nitrite: NEGATIVE
Protein, ur: NEGATIVE mg/dL
Specific Gravity, Urine: 1.006 (ref 1.005–1.030)
pH: 6 (ref 5.0–8.0)

## 2021-03-26 LAB — COMPREHENSIVE METABOLIC PANEL
ALT: 54 U/L — ABNORMAL HIGH (ref 0–44)
AST: 51 U/L — ABNORMAL HIGH (ref 15–41)
Albumin: 4 g/dL (ref 3.5–5.0)
Alkaline Phosphatase: 48 U/L (ref 38–126)
Anion gap: 10 (ref 5–15)
BUN: 9 mg/dL (ref 6–20)
CO2: 23 mmol/L (ref 22–32)
Calcium: 9.5 mg/dL (ref 8.9–10.3)
Chloride: 105 mmol/L (ref 98–111)
Creatinine, Ser: 0.43 mg/dL — ABNORMAL LOW (ref 0.44–1.00)
GFR, Estimated: >60 mL/min (ref >60)
Glucose, Bld: 130 mg/dL — ABNORMAL HIGH (ref 70–99)
Potassium: 3.5 mmol/L (ref 3.5–5.1)
Sodium: 138 mmol/L (ref 135–145)
Total Bilirubin: 0.7 mg/dL (ref 0.3–1.2)
Total Protein: 6.9 g/dL (ref 6.5–8.1)

## 2021-03-26 LAB — HCG, QUANTITATIVE, PREGNANCY: hCG, Beta Chain, Quant, S: 4028 m[IU]/mL — ABNORMAL HIGH (ref <5)

## 2021-03-26 MED ORDER — PRENATAL VITAMIN 27-0.8 MG PO TABS: 1.0000 | ORAL_TABLET | ORAL | 0 refills | Status: AC

## 2021-03-26 NOTE — Progress Notes (Signed)
Patient desires her prenatal care at [facility] and has an appointment on 04/12/2021. Hart Carwin, RN

## 2021-03-26 NOTE — ED Triage Notes (Signed)
Pt reports that she is [redacted] weeks pregnant, and has been having cramping, she [facility] for proof of pregnancy, she told them about the cramping and they told her to come here to make sure she was not having an etopic pregnancy.

## 2021-03-27 ENCOUNTER — Emergency Department: Payer: Medicaid Other

## 2021-03-27 ENCOUNTER — Telehealth: Payer: Self-pay

## 2021-03-27 ENCOUNTER — Emergency Department
Admission: EM | Admit: 2021-03-27 | Discharge: 2021-03-27 | Disposition: A | Discharge status: Home / Self Care | Payer: Medicaid Other | Attending: Emergency Medicine | Admitting: Emergency Medicine

## 2021-03-27 DIAGNOSIS — N939 Abnormal uterine and vaginal bleeding, unspecified: Secondary | ICD-10-CM | POA: Diagnosis not present

## 2021-03-27 DIAGNOSIS — Z3A01 Less than 8 weeks gestation of pregnancy: Secondary | ICD-10-CM

## 2021-03-27 DIAGNOSIS — R58 Hemorrhage, not elsewhere classified: Secondary | ICD-10-CM

## 2021-03-27 DIAGNOSIS — O209 Hemorrhage in early pregnancy, unspecified: Secondary | ICD-10-CM | POA: Diagnosis not present

## 2021-03-27 LAB — ABO/RH: ABO/RH(D): A POS

## 2021-03-27 NOTE — Telephone Encounter (Signed)
Pt calling; has NOB appt on the 30th; last night was cramping; went to the hosp; was told it's either a sac with no baby or an unviable pregnancy; was told to call and schedule repeat u/s for one week.  [phone]

## 2021-03-27 NOTE — Discharge Instructions (Addendum)
As we discussed, your ultrasound showed a gestational sac but no baby yet.  This could be due to the fact that this is a very early pregnancy but it can also be an abnormal pregnancy.  Therefore if you continue to have lower abdominal cramping you need to have a repeat ultrasound in a week to confirm the pregnancy and confirm that there is no pregnancy outside the uterus.  Follow-up with your OB for that.  Return to the emergency room for severe abdominal pain

## 2021-03-27 NOTE — Telephone Encounter (Signed)
Patient is scheduled for 03/29/21 with AMS

## 2021-03-27 NOTE — ED Notes (Signed)
Pt OTF with US 

## 2021-03-27 NOTE — ED Provider Notes (Signed)
[facility] Emergency Department Provider Note  ____________________________________________  Time seen: Approximately 2:23 AM  I have reviewed the triage vital signs and the nursing notes.   HISTORY  Chief Complaint Abdominal Pain   HPI Brooke Aguirre is a 25 y.o. female who presents for evaluation of lower abdominal cramping.  Patient reports that she had a positive pregnancy test at home 2 weeks ago.  Per LMP she feels like she is almost [redacted] weeks pregnant.  She went to the health department today to have a confirmatory test and when she mentioned she was having lower abdominal cramping they sent her here for an emergent ultrasound to rule out ectopic pregnancy.  She denies any severe abdominal pain.  Reports that the cramping is intermittent and suprapubic kind of like a menstrual cramp.  She denies vaginal bleeding, vaginal discharge.  This is her third pregnancy.  She had 2 full-term pregnancies with no complications in the past.  She denies nausea or vomiting, dysuria or hematuria, chest pain or shortness of breath.   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   Tobacco user 02/25/2018   Obesity (BMI 30.0-34.9) 02/25/2018   Scar 06/24/2013   Heart murmur 05/03/2012   URI, acute 05/03/2012   Pyelonephritis 12/02/2011    Past Surgical History:  Procedure Laterality Date   EXTRACORPOREAL SHOCK WAVE  LITHOTRIPSY Left 04/14/2019   Procedure: EXTRACORPOREAL SHOCK WAVE LITHOTRIPSY (ESWL);  Surgeon: Riki Altes, MD;  Location: [facility] ORS;  Service: Urology;  Laterality: Left;   EXTRACORPOREAL SHOCK WAVE LITHOTRIPSY Right 05/05/2019   Procedure: EXTRACORPOREAL SHOCK WAVE LITHOTRIPSY (ESWL);  Surgeon: Riki Altes, MD;  Location: [facility] ORS;  Service: Urology;  Laterality: Right;   LEEP N/A 05/03/2018   Procedure: LOOP ELECTROSURGICAL EXCISION PROCEDURE (LEEP);  Surgeon: Herold Harms, MD;  Location: [facility] ORS;  Service: Gynecology;  Laterality: N/A;   REMOVAL OF DRUG DELIVERY IMPLANT Left 12/15/2014   Procedure: REMOVAL OF DRUG DELIVERY IMPLANT;  Surgeon: Nadara Mustard, MD;  Location: [facility] ORS;  Service: Gynecology;  Laterality: Left;    Prior to Admission medications   Medication Sig Start Date End Date Taking? Authorizing Provider  benzonatate (TESSALON PERLES) 100 MG capsule Take 1 capsule (100 mg total) by mouth 3 (three) times daily as needed. 03/06/21   Daphine Deutscher, Mary-Margaret, FNP  fluticasone (FLONASE) 50 MCG/ACT nasal spray Place 2 sprays into both nostrils daily. 03/06/21   Daphine Deutscher, Mary-Margaret, FNP  NEOMYCIN-POLYMYXIN-HYDROCORTISONE (CORTISPORIN) 1 % SOLN OTIC solution Place 3 drops into the right ear 4 (four) times daily. X 7 days 12/24/20   Margaretann Loveless, PA-C  norelgestromin-ethinyl estradiol Burr Medico) 150-35 MCG/24HR transdermal patch Place 1 patch onto the skin once a week. 12/07/20   Conard Novak, MD  Prenatal Vit-Fe Fumarate-FA (PRENATAL VITAMIN) 27-0.8 MG TABS Take 1 tablet by mouth  1 day or 1 dose for 1 dose. 03/26/21 03/27/21  Federico Flake, MD  promethazine (PHENERGAN) 25 MG tablet Take 1 tablet (25 mg total) by mouth every 8 (eight) hours as needed for nausea or vomiting. 10/18/20 11/13/20  Couture, Cortni S, PA-C    Allergies Kiwi extract, Amoxicillin, Ciprofloxacin, Penicillins, Tramadol, Cranberry, Latex, and Sulfa antibiotics  Family History   Problem Relation Age of Onset   Hepatitis C Mother    Hepatitis C Father    Breast cancer Neg Hx    Ovarian cancer Neg Hx    Colon cancer Neg Hx     Social History Social History   Tobacco Use   Smoking status: Every Day    Packs/day: 1.00    Years: 5.00    Pack years: 5.00    Types: Cigarettes   Smokeless tobacco: Never  Vaping Use   Vaping Use: Never used  Substance Use Topics   Alcohol use: Yes    Comment: occasional    Drug use: No    Review of Systems  Constitutional: Negative for fever. Eyes: Negative for visual changes. ENT: Negative for sore throat. Neck: No neck pain  Cardiovascular: Negative for chest pain. Respiratory: Negative for shortness of breath. Gastrointestinal: + lower abd cramping. Negative for abdominal pain, vomiting or diarrhea. Genitourinary: Negative for dysuria. Musculoskeletal: Negative for back pain. Skin: Negative for rash. Neurological: Negative for headaches, weakness or numbness. Psych: No SI or HI  ____________________________________________   PHYSICAL EXAM:  VITAL SIGNS: ED Triage Vitals  Enc Vitals Group     BP 03/26/21 2101 (!) 139/92     Pulse Rate 03/26/21 2101 (!) 106     Resp 03/26/21 2101 20     Temp 03/26/21 2101 98.1 F (36.7 C)     Temp Source 03/26/21 2101 Oral     SpO2 03/26/21 2101 99 %     Weight 03/26/21 2102 223 lb 8 oz (101.4 kg)     Height 03/26/21 2102 4\' 11"  (1.499 m)     Head Circumference --      Peak Flow --      Pain Score 03/26/21 2101 9     Pain Loc --      Pain Edu? --      Excl. in GC? --     Constitutional: Alert and oriented. Well appearing and in no apparent distress. HEENT:      Head: Normocephalic and atraumatic.         Eyes: Conjunctivae are normal. Sclera is non-icteric.       Mouth/Throat: Mucous membranes are moist.       Neck: Supple with no signs of meningismus. Cardiovascular: Regular rate and rhythm. No murmurs, gallops, or rubs. 2+ symmetrical distal pulses are  present in all extremities. No JVD. Respiratory: Normal respiratory effort. Lungs are clear to auscultation bilaterally.  Gastrointestinal: Soft, non tender, and non distended with positive bowel sounds. No rebound or guarding. Genitourinary: No CVA tenderness. Musculoskeletal:  No edema, cyanosis, or erythema of extremities. Neurologic: Normal speech and language. Face is symmetric. Moving all extremities. No gross focal neurologic deficits are appreciated. Skin: Skin is warm, dry and intact. No rash noted. Psychiatric: Mood and affect are normal. Speech and behavior are normal.  ____________________________________________   LABS (all labs ordered are listed, but only abnormal results are displayed)  Labs Reviewed  CBC WITH DIFFERENTIAL/PLATELET - Abnormal; Notable for the following components:      Result Value   WBC 11.2 (*)  MCH 35.7 (*)    MCHC 36.9 (*)    RDW 11.3 (*)    All other components within normal limits  COMPREHENSIVE METABOLIC PANEL - Abnormal; Notable for the following components:   Glucose, Bld 130 (*)    Creatinine, Ser 0.43 (*)    AST 51 (*)    ALT 54 (*)    All other components within normal limits  HCG, QUANTITATIVE, PREGNANCY - Abnormal; Notable for the following components:   hCG, Beta Chain, Quant, S 4,028 (*)    All other components within normal limits  URINALYSIS, ROUTINE W REFLEX MICROSCOPIC - Abnormal; Notable for the following components:   Color, Urine STRAW (*)    APPearance CLEAR (*)    All other components within normal limits  ABO/RH   ____________________________________________  EKG  none  ____________________________________________  RADIOLOGY  I have personally reviewed the images performed during this visit and I agree with the Radiologist's read.   Interpretation by Radiologist:  US OB LESS THAN 14 WEEKS WITH OB TRANSVAGINAL  Result Date: 03/27/2021 CLINICAL DATA:  Vaginal bleeding and cramping x1 week. EXAM: OBSTETRIC  <14 WK [place] AND TRANSVAGINAL OB US TECHNIQUE: Both transabdominal and transvaginal ultrasound examinations were performed for complete evaluation of the gestation as well as the maternal uterus, adnexal regions, and pelvic cul-de-sac. Transvaginal technique was performed to assess early pregnancy. COMPARISON:  None. FINDINGS: Intrauterine gestational sac: Single Yolk sac:  Not Visualized. Embryo:  Not Visualized. Cardiac Activity: Not Visualized. Heart Rate: N/A  bpm MSD: 6.0 mm   5 w   2 d Subchorionic hemorrhage:  None visualized. Maternal uterus/adnexae: A 1.8 cm x 2.1 cm x 2.3 cm corpus luteum cyst is noted within an otherwise normal appearing right ovary. The left ovary is visualized and is normal in appearance. IMPRESSION: Probable early intrauterine gestational sac, but no yolk sac, fetal pole, or cardiac activity yet visualized. Recommend follow-up quantitative B-HCG levels and follow-up US in 14 days to assess viability. This recommendation follows SRU consensus guidelines: Diagnostic Criteria for Nonviable Pregnancy Early in the First Trimester. Malva Limes Med 2013; 338:2505-39. Electronically Signed   By: Aram Candela M.D.   On: 03/27/2021 01:41     ____________________________________________   PROCEDURES  Procedure(s) performed: None Procedures Critical Care performed:  None ____________________________________________   INITIAL IMPRESSION / ASSESSMENT AND PLAN / ED COURSE  25 y.o. female who presents for evaluation of lower abdominal cramping in the setting of a positive pregnancy test at home.  Patient is well-appearing in no distress with normal vital signs.  Denies any current abdominal pain.  Abdomen is soft and nontender.  Blood work showing no signs of anemia, no significant electrolyte derangements, mildly elevated LFTs which seems to be patient's baseline.  Beta quant of 4028.  UA with no signs of UTI.  Ultrasound showing early intrauterine gestational sac measuring 5 weeks and  2 days with no fetal pole.  Discussed follow-up with OB in 1 to 2 weeks for repeat beta quant and ultrasound.  Recommended return to the emergency room for severe abdominal pain, syncope, vaginal bleeding.      _____________________________________________ Please note:  Patient was evaluated in Emergency Department today for the symptoms described in the history of present illness. Patient was evaluated in the context of the global COVID-19 pandemic, which necessitated consideration that the patient might be at risk for infection with the SARS-CoV-2 virus that causes COVID-19. Institutional protocols and algorithms that pertain to the evaluation of patients  at risk for COVID-19 are in a state of rapid change based on information released by regulatory bodies including the CDC and federal and state organizations. These policies and algorithms were followed during the patient's care in the ED.  Some ED evaluations and interventions may be delayed as a result of limited staffing during the pandemic.   [place] Controlled Substance Database was reviewed by me. ____________________________________________   FINAL CLINICAL IMPRESSION(S) / ED DIAGNOSES   Final diagnoses:  Bleeding  Less than [redacted] weeks gestation of pregnancy      NEW MEDICATIONS STARTED DURING THIS VISIT:  ED Discharge Orders     None        Note:  This document was prepared using Dragon voice recognition software and may include unintentional dictation errors.    Don Perking, [place], MD 03/27/21 [phone]

## 2021-03-28 ENCOUNTER — Telehealth: Payer: Self-pay

## 2021-03-28 NOTE — Telephone Encounter (Signed)
Transition Care Management Unsuccessful Follow-up Telephone Call  Date of discharge and from where:  03/27/2021-[facility]  Attempts:  1st Attempt  Reason for unsuccessful TCM follow-up call:  Left voice message

## 2021-03-29 ENCOUNTER — Ambulatory Visit (INDEPENDENT_AMBULATORY_CARE_PROVIDER_SITE_OTHER): Payer: Medicaid Other | Admitting: Obstetrics and Gynecology

## 2021-03-29 ENCOUNTER — Encounter: Payer: Self-pay | Admitting: Obstetrics and Gynecology

## 2021-03-29 ENCOUNTER — Other Ambulatory Visit: Payer: Self-pay

## 2021-03-29 VITALS — BP 120/68 | Ht 59.0 in | Wt 222.0 lb

## 2021-03-29 DIAGNOSIS — O3680X0 Pregnancy with inconclusive fetal viability, not applicable or unspecified: Secondary | ICD-10-CM

## 2021-03-30 LAB — BETA HCG QUANT (REF LAB): hCG Quant: 5762 m[IU]/mL

## 2021-03-30 NOTE — Telephone Encounter (Signed)
Transition Care Management Unsuccessful Follow-up Telephone Call  Date of discharge and from where:  03/27/2021 from [facility]  Attempts:  2nd Attempt  Reason for unsuccessful TCM follow-up call:  Left voice message

## 2021-03-31 NOTE — Telephone Encounter (Signed)
Transition Care Management Follow-up Telephone Call Date of discharge and from where: 03/27/2021 from [facility] How have you been since you were released from the hospital? Pt stated that she was feeling well and did not have any questions or concerns at this time.  Any questions or concerns? No  Items Reviewed: Did the pt receive and understand the discharge instructions provided? Yes  Medications obtained and verified? Yes  Other? No  Any new allergies since your discharge? No  Dietary orders reviewed? No Do you have support at home? Yes   Functional Questionnaire: (I = Independent and D = Dependent) ADLs: I  Bathing/Dressing- I  Meal Prep- I  Eating- I  Maintaining continence- I  Transferring/Ambulation- I  Managing Meds- I   Follow up appointments reviewed:  PCP Hospital f/u appt confirmed? No   Specialist Hospital f/u appt confirmed? Yes  Had appt on Friday.  Are transportation arrangements needed? No  If their condition worsens, is the pt aware to call PCP or go to the Emergency Dept.? Yes Was the patient provided with contact information for the PCP's office or ED? Yes Was to pt encouraged to call back with questions or concerns? Yes

## 2021-04-01 ENCOUNTER — Telehealth: Payer: Self-pay

## 2021-04-01 ENCOUNTER — Other Ambulatory Visit: Payer: Self-pay | Admitting: Obstetrics and Gynecology

## 2021-04-01 DIAGNOSIS — O3680X0 Pregnancy with inconclusive fetal viability, not applicable or unspecified: Secondary | ICD-10-CM

## 2021-04-01 DIAGNOSIS — O0281 Inappropriate change in quantitative human chorionic gonadotropin (hCG) in early pregnancy: Secondary | ICD-10-CM

## 2021-04-01 NOTE — Telephone Encounter (Signed)
Need HCG drawn tomorrow 09/30/20 order in

## 2021-04-01 NOTE — Telephone Encounter (Signed)
Patient is scheduled 04/02/21 for labs

## 2021-04-01 NOTE — Telephone Encounter (Signed)
Pt calling; was seen Fri; HCG was drawn; calling for results and plan moving forward.  [phone]

## 2021-04-02 ENCOUNTER — Other Ambulatory Visit: Payer: Self-pay

## 2021-04-02 ENCOUNTER — Other Ambulatory Visit: Payer: Medicaid Other

## 2021-04-02 DIAGNOSIS — O0281 Inappropriate change in quantitative human chorionic gonadotropin (hCG) in early pregnancy: Secondary | ICD-10-CM

## 2021-04-02 DIAGNOSIS — O3680X0 Pregnancy with inconclusive fetal viability, not applicable or unspecified: Secondary | ICD-10-CM

## 2021-04-03 LAB — BETA HCG QUANT (REF LAB): hCG Quant: 14992 m[IU]/mL

## 2021-04-09 NOTE — Progress Notes (Signed)
Obstetric Problem Visit    Chief Complaint:  Chief Complaint  Patient presents with   Follow-up    Beta HCG, RM 4    History of Present Illness: Patient is a 25 y.o. [id number] Unknown presenting for first trimester cramping which prompted ER evaluation on 03/27/2021.  ER evaluation rule out ectopic pregnancy showing early IUP (GS without yolk sac or fetal pole) and HCG level of 4,020 mIU/mL.  No vaginal bleeding, no history of recent trauma.  Prior to pregnancies were uncomplicated term pregnancies.  I History of prior miscarriage:  No Prior ultrasound demonstrating IUP: emergency room visit on 03/27/2021.  Prior ultrasound demonstrating viable IUP:  No Prior Serum HCG:  Yes Rh status: A POS Performed at [facility], [address] [place], [place] [address]    Review of Systems: ROS  Past Medical History:  Patient Active Problem List   Diagnosis Date Noted   Left nephrolithiasis 03/30/2019   Nephrolithiasis 03/29/2019   Status post LEEP (loop electrosurgical excision procedure) of cervix 05/03/2018    DIAGNOSIS:  A. 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  - LOW-GRADE SQUAMOUS INTRAEPITHELIAL LESION (LSIL / CIN1) INVOLVING ONE  QUADRANT; MARGINS ARE NEGATIVE.   B.  ENDOCERVICAL LEEP:  - ENDOCERVICAL TISSUE; NEGATIVE FOR DYSPLASIA AND MALIGNANCY.   C.  ENDOCERVICAL CURETTINGS, POST LEEP:  - STRIPS OF ENDOCERVICAL GLANDULAR EPITHELIUM WITH FOCAL SQUAMOUS  METAPLASIA.  - NEGATIVE FOR DYSPLASIA AND MALIGNANCY.     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 Endocervix, curettage - SCANT STRIPS OF BENIGN CERVICAL MUCOSA. - NEGATIVE FOR CARCINOMA. 2. Cervix, biopsy, at 11 o'clock - HIGH GRADE SQUAMOUS INTRAEPITHELIAL LESION (HSIL, CIN-II). 3. Cervix, biopsy, at 12 o'clock - LOW GRADE SQUAMOUS INTRAEPITHELIAL  LESION (LSIL, CIN-I).  05/03/2018 LEEP cone biopsy  Ectocervical LEEP-  Endocervical LEEP-  Post LEEP ECC-    Tobacco user 02/25/2018   Obesity (BMI 30.0-34.9) 02/25/2018   Scar 06/24/2013   Heart murmur 05/03/2012   URI, acute 05/03/2012   Pyelonephritis 12/02/2011    Past Surgical History:  Past Surgical History:  Procedure Laterality Date   EXTRACORPOREAL SHOCK WAVE LITHOTRIPSY Left 04/14/2019   Procedure: EXTRACORPOREAL SHOCK WAVE LITHOTRIPSY (ESWL);  Surgeon: Riki Altes, MD;  Location: [facility] ORS;  Service: Urology;  Laterality: Left;   EXTRACORPOREAL SHOCK WAVE LITHOTRIPSY Right 05/05/2019   Procedure: EXTRACORPOREAL SHOCK WAVE LITHOTRIPSY (ESWL);  Surgeon: Riki Altes, MD;  Location: [facility] ORS;  Service: Urology;  Laterality: Right;   LEEP N/A 05/03/2018   Procedure: LOOP ELECTROSURGICAL EXCISION PROCEDURE (LEEP);  Surgeon: Herold Harms, MD;  Location: [facility] ORS;  Service: Gynecology;  Laterality: N/A;   REMOVAL OF DRUG DELIVERY IMPLANT Left 12/15/2014   Procedure: REMOVAL OF DRUG DELIVERY IMPLANT;  Surgeon: Nadara Mustard, MD;  Location: [facility] ORS;  Service: Gynecology;  Laterality: Left;    Obstetric History: [id number]  Family History:  Family History  Problem Relation Age of Onset   Hepatitis C Mother    Hepatitis C Father    Breast cancer Neg Hx    Ovarian cancer Neg Hx    Colon cancer Neg Hx     Social History:  Social History   Socioeconomic History   Marital status: Married    Spouse name: Seward Grater   Number of children: Not on file   Years of education: Not on  file   Highest education level: Not on file  Occupational History   Not on file  Tobacco Use   Smoking status: Every Day    Packs/day: 1.00    Years: 5.00    Pack years: 5.00    Types: Cigarettes   Smokeless tobacco: Never  Vaping Use   Vaping Use: Never used  Substance and Sexual Activity   Alcohol use: Yes    Comment: occasional    Drug use: No   Sexual activity:  Yes    Birth control/protection: I.U.D.    Comment: Mirena removed June or July 2022  Other Topics Concern   Not on file  Social History Narrative   Not on file   Social Determinants of Health   Financial Resource Strain: Not on file  Food Insecurity: Not on file  Transportation Needs: Not on file  Physical Activity: Not on file  Stress: Not on file  Social Connections: Not on file  Intimate Partner Violence: Not At Risk   Fear of Current or Ex-Partner: No   Emotionally Abused: No   Physically Abused: No   Sexually Abused: No    Allergies:  Allergies  Allergen Reactions   Kiwi Extract Anaphylaxis   Amoxicillin Hives, Rash and Other (See Comments)    Blisters Has patient had a PCN reaction causing immediate rash, facial/tongue/throat swelling, SOB or lightheadedness with hypotension: No Has patient had a PCN reaction causing severe rash involving mucus membranes or skin necrosis: Yes Has patient had a PCN reaction that required hospitalization: No Has patient had a PCN reaction occurring within the last 10 years: Unknown If all of the above answers are "NO", then may proceed with Cephalosporin use.    Ciprofloxacin Hives    blisters   Penicillins Rash and Other (See Comments)    Has patient had a PCN reaction causing immediate rash, facial/tongue/throat swelling, SOB or lightheadedness with hypotension:no Has patient had a PCN reaction causing severe rash involving mucus membranes or skin necrosis: yes Has patient had a PCN reaction that required hospitalization no Has patient had a PCN reaction occurring within the last 10 years: yes If all of the above answers are "NO", then may proceed with Cephalosporin use.    Tramadol Itching   Cranberry Rash   Latex Rash   Sulfa Antibiotics Rash and Other (See Comments)    BLISTERS    Medications: Prior to Admission medications   Medication Sig Start Date End Date Taking? Authorizing Provider  benzonatate (TESSALON PERLES)  100 MG capsule Take 1 capsule (100 mg total) by mouth 3 (three) times daily as needed. 03/06/21   Daphine Deutscher, Mary-Margaret, FNP  fluticasone (FLONASE) 50 MCG/ACT nasal spray Place 2 sprays into both nostrils daily. 03/06/21   Daphine Deutscher, Mary-Margaret, FNP  NEOMYCIN-POLYMYXIN-HYDROCORTISONE (CORTISPORIN) 1 % SOLN OTIC solution Place 3 drops into the right ear 4 (four) times daily. X 7 days 12/24/20   Margaretann Loveless, PA-C  norelgestromin-ethinyl estradiol Burr Medico) 150-35 MCG/24HR transdermal patch Place 1 patch onto the skin once a week. 12/07/20   Conard Novak, MD  promethazine (PHENERGAN) 25 MG tablet Take 1 tablet (25 mg total) by mouth every 8 (eight) hours as needed for nausea or vomiting. 10/18/20 11/13/20  Couture, Cortni S, PA-C    Physical Exam Vitals: Blood pressure 120/68, height 4\' 11"  (1.499 m), weight 222 lb (100.7 kg), last menstrual period 02/16/2021. General: NAD HEENT: normocephalic, anicteric Pulmonary: No increased work of breathing, Extremities: no edema, erythema, or tenderness Neurologic: Grossly  intact Psychiatric: mood appropriate, affect full  Assessment: 25 y.o. [id number] Unknown presenting for evaluation of pregnancy with inconclusive fetal viability  Plan: Problem List Items Addressed This Visit   None Visit Diagnoses     Pregnancy with inconclusive fetal viability, single or unspecified fetus    -  Primary   Relevant Orders   Beta hCG quant (ref lab) (Completed)       1) Pregnancy with inconclusive fetal viability - will trend HCG while awaiting ultrasound follow up  2) If no already done will proceed with TVUS evaluation to document viability, and if uncertain viability or absence of a demonstrable IUP (and no previous documentation of IUP) will trend HCG levels. - based on ultrasound findings 03/27/2021 follow up ultrasound is recommended in 2 weeks "Society of Radiologyst in Ultrasound Guidelines for Transvaginal Ultrasonographic Diagnosis of Early  Pregnancy Loss" and adopted in ACOG Practice Bulletin Number 150, May 2015 (reaffirmed 2017) "Early Pregnancy Loss"   3) The patient is A POS Performed at [facility], [address] [place], [place] [address]   rhogam is therefore not indicated to decrease the risk rhesus alloimmunization.    4) Routine bleeding precautions were discussed with the patient prior the conclusion of today's visit.    Vena Austria, MD, [facility], [facility] 04/09/2021, 4:32 AM

## 2021-04-12 ENCOUNTER — Other Ambulatory Visit (HOSPITAL_COMMUNITY)
Admission: RE | Admit: 2021-04-12 | Discharge: 2021-04-12 | Disposition: A | Discharge status: Home / Self Care | Payer: Medicaid Other | Source: Ambulatory Visit | Attending: Advanced Practice Midwife | Admitting: Advanced Practice Midwife

## 2021-04-12 ENCOUNTER — Other Ambulatory Visit: Payer: Self-pay

## 2021-04-12 ENCOUNTER — Encounter: Payer: Self-pay | Admitting: Advanced Practice Midwife

## 2021-04-12 ENCOUNTER — Ambulatory Visit (INDEPENDENT_AMBULATORY_CARE_PROVIDER_SITE_OTHER): Payer: Medicaid Other | Admitting: Advanced Practice Midwife

## 2021-04-12 VITALS — BP 122/76 | HR 101 | Wt 221.0 lb

## 2021-04-12 DIAGNOSIS — Z369 Encounter for antenatal screening, unspecified: Secondary | ICD-10-CM | POA: Insufficient documentation

## 2021-04-12 DIAGNOSIS — O99211 Obesity complicating pregnancy, first trimester: Secondary | ICD-10-CM

## 2021-04-12 DIAGNOSIS — Z113 Encounter for screening for infections with a predominantly sexual mode of transmission: Secondary | ICD-10-CM | POA: Insufficient documentation

## 2021-04-12 DIAGNOSIS — Z1159 Encounter for screening for other viral diseases: Secondary | ICD-10-CM

## 2021-04-12 DIAGNOSIS — O0990 Supervision of high risk pregnancy, unspecified, unspecified trimester: Secondary | ICD-10-CM | POA: Insufficient documentation

## 2021-04-12 DIAGNOSIS — O0991 Supervision of high risk pregnancy, unspecified, first trimester: Secondary | ICD-10-CM | POA: Diagnosis not present

## 2021-04-12 DIAGNOSIS — Z3A01 Less than 8 weeks gestation of pregnancy: Secondary | ICD-10-CM

## 2021-04-12 DIAGNOSIS — O99210 Obesity complicating pregnancy, unspecified trimester: Secondary | ICD-10-CM | POA: Insufficient documentation

## 2021-04-12 NOTE — Progress Notes (Signed)
NOB 

## 2021-04-12 NOTE — Patient Instructions (Signed)
Exercise During Pregnancy Exercise is an important part of being healthy for people of all ages. Exercise improves the function of your heart and lungs and helps you maintain strength, flexibility, and a healthy body weight. Exercise also boosts energy levels and elevates mood. Most women should exercise regularly during pregnancy. Exercise routines may need to change as your pregnancy progresses. In rare cases, women with certain medical conditions or complications may be asked to limit or avoid exercise during pregnancy. Your health care provider will give you information on what will work for you. How does this affect me? Along with maintaining general strength and flexibility, exercising during pregnancy can help: Keep strength in muscles that are used during labor and childbirth. Decrease low back pain or symptoms of depression. Control weight gain during pregnancy. Reduce the risk of needing insulin if you develop diabetes during pregnancy. Decrease the risk of cesarean delivery. Speed up your recovery after giving birth. Relieve constipation. How does this affect my baby? Exercise can help you have a healthy pregnancy. Exercise does not cause early (premature) birth. It will not cause your baby to weigh less at birth. What exercises can I do? Many exercises are safe for you to do during pregnancy. Do a variety of exercises that safely increase your heart and breathing rates and help you build and maintain muscle strength. Do exercises exactly as told by your health care provider. You may do these exercises: Walking. Swimming. Water aerobics. Riding a stationary bike. Modified yoga or Pilates. Tell your instructor that you are pregnant. Avoid overstretching, and avoid lying on your back for long periods of time. Running or jogging. Choose this type of exercise only if: You ran or jogged regularly before your pregnancy. You can run or jog and still talk in complete sentences. What  exercises should I avoid? You may be told to limit high-intensity exercise depending on your level of fitness and whether you exercised regularly before you were pregnant. You can tell that you are exercising at a high intensity if you are breathing much harder and faster and cannot hold a conversation while exercising. You must avoid: Contact sports. Activities that put you at risk for falling on or being hit in the belly, such as downhill skiing, waterskiing, surfing, rock climbing, cycling, gymnastics, and horseback riding. Scuba diving. Skydiving. Hot yoga or hot Pilates. These activities take place in a room that is heated to high temperatures. Jogging or running, unless you jogged or ran regularly before you were pregnant. While jogging or running, you should always be able to talk in full sentences. Do not run or jog so fast that you are unable to have a conversation. Do not exercise at more than 6,000 feet above sea level (high elevation) if you are not used to exercising at high elevation. How do I exercise in a safe way?  Avoid overheating. Do not exercise in very high temperatures. Wear loose-fitting, breathable clothes. Avoid dehydration. Drink enough fluid before, during, and after exercise to keep your urine pale yellow. Avoid overstretching. Because of hormone changes during pregnancy, it is easy to overstretch muscles, tendons, and ligaments. Start slowly and ask your health care provider to recommend the types of exercise that are safe for you. Do not exercise to lose weight. Wear a sports bra to support your breasts. Avoid standing still or lying flat on your back as much as you can. Follow these instructions at home: Exercise on most days or all days of the week. Try to   exercise for 30 minutes a day, 5 days a week, unless your health care provider tells you not to. If you actively exercised before your pregnancy and you are healthy, your health care provider may tell you to  continue to do moderate-intensity to high-intensity exercise. If you are just starting to exercise or did not exercise much before your pregnancy, your health care provider may tell you to do low-intensity to moderate-intensity exercise. Questions to ask your health care provider Is exercise safe for me? What are signs that I should stop exercising? Does my health condition mean that I should not exercise during pregnancy? When should I avoid exercising during pregnancy? Stop exercising and contact a health care provider if: You have any unusual symptoms such as: Mild contractions of the uterus or cramps in the abdomen. A dizzy feeling that does not go away when you rest. Stop exercising and get help right away if: You have any unusual symptoms such as: Sudden, severe pain in your low back or your belly. Regular, painful contractions of your uterus. Chest pain. Bleeding or fluid leaking from your vagina. Shortness of breath. Headache. Pain and swelling of your calves. Summary Most women should exercise regularly throughout pregnancy. In rare cases, women with certain medical conditions or complications may be asked to limit or avoid exercise during pregnancy. Do not exercise to lose weight during pregnancy. Your health care provider will tell you what level of physical activity is right for you. Stop exercising and contact a health care provider if you have unusual symptoms, such as mild contractions or dizziness. This information is not intended to replace advice given to you by your health care provider. Make sure you discuss any questions you have with your health care provider. Document Revised: 02/15/2020 Document Reviewed: 02/15/2020 [employer] Patient Education  2022 [employer] Eating Plan for Pregnant Women While you are pregnant, your body requires additional nutrition to help support your growing baby. You also have a higher need for some vitamins and minerals, such as folic  acid, calcium, iron, and vitamin D. Eating a healthy, well-balanced diet is very important for your health and your baby's health. Your need for extra calories varies over the course of your pregnancy. Pregnancy is divided into three trimesters, with each trimester lasting 3 months. For most women, it is recommended to consume: 150 extra calories a day during the first trimester. 300 extra calories a day during the second trimester. 300 extra calories a day during the third trimester. What are tips for following this plan? Cooking Practice good food safety and cleanliness. Wash your hands before you eat and after you prepare raw meat. Wash all fruits and vegetables well before peeling or eating. Taking these actions can help to prevent foodborne illnesses that can be very dangerous to your baby, such as listeriosis. Ask your health care provider for more information about listeriosis. Make sure that all meats, poultry, and eggs are cooked to food-safe temperatures or "well-done." Meal planning  Eat a variety of foods (especially fruits and vegetables) to get a full range of vitamins and minerals. Two or more servings of fish are recommended each week in order to get the most benefits from omega-3 fatty acids that are found in seafood. Choose fish that are lower in mercury, such as salmon and pollock. Limit your overall intake of foods that have "empty calories." These are foods that have little nutritional value, such as sweets, desserts, candies, and sugar-sweetened beverages. Drinks that contain caffeine are okay   to drink, but it is better to avoid caffeine. Keep your total caffeine intake to less than 200 mg each day (which is 12 oz or 355 mL of coffee, tea, or soda) or the limit as told by your health care provider. General information Do not try to lose weight or go on a diet during pregnancy. Take a prenatal vitamin to help meet your additional vitamin and mineral needs during pregnancy,  specifically for folic acid, iron, calcium, and vitamin D. Remember to stay active. Ask your health care provider what types of exercise and activities are safe for you. What does 150 extra calories look like? Healthy options that provide 150 extra calories each day could be any of the following: 6-8 oz (170-227 g) plain low-fat yogurt with  cup (70 g) berries. 1 apple with 2 tsp (11 g) peanut butter. Cut-up vegetables with  cup (60 g) hummus. 8 fl oz (237 mL) low-fat chocolate milk. 1 stick of string cheese with 1 medium orange. 1 peanut butter and jelly sandwich that is made with one slice of whole-wheat bread and 1 tsp (5 g) of peanut butter. For 300 extra calories, you could eat two of these healthy options each day. What is a healthy amount of weight to gain? The right amount of weight gain for you is based on your BMI (body mass index) before you became pregnant. If your BMI was less than 18 (underweight), you should gain 28-40 lb (13-18 kg). If your BMI was 18-24.9 (normal), you should gain 25-35 lb (11-16 kg). If your BMI was 25-29.9 (overweight), you should gain 15-25 lb (7-11 kg). If your BMI was 30 or greater (obese), you should gain 11-20 lb (5-9 kg). What if I am having twins or multiples? Generally, if you are carrying twins or multiples: You may need to eat 300-600 extra calories a day. The recommended range for total weight gain is 25-54 lb (11-25 kg), depending on your BMI before pregnancy. Talk with your health care provider to find out about nutritional needs, weight gain, and exercise that is right for you. What foods should I eat? Fruits All fruits. Eat a variety of colors and types of fruit. Remember to wash your fruits well before peeling or eating. Vegetables All vegetables. Eat a variety of colors and types of vegetables. Remember to wash your vegetables well before peeling or eating. Grains All grains. Choose whole grains, such as whole-wheat bread, oatmeal, or  brown rice. Meats and other protein foods Lean meats, including chicken, turkey, and lean cuts of beef, veal, or pork. Fish that is higher in omega-3 fatty acids and lower in mercury, such as salmon, herring, mussels, trout, sardines, pollock, shrimp, crab, and lobster. Tofu. Tempeh. Beans. Eggs. Peanut butter and other nut butters. Dairy Pasteurized milk and milk alternatives, such as almond milk. Pasteurized yogurt and pasteurized cheese. Cottage cheese. Sour cream. Beverages Water. Juices that contain 100% fruit juice or vegetable juice. Caffeine-free teas and decaffeinated coffee. Fats and oils Fats and oils are okay to include in moderation. Sweets and desserts Sweets and desserts are okay to include in moderation. Seasoning and other foods All pasteurized condiments. The items listed above may not be a complete list of foods and beverages you can eat. Contact a dietitian for more information. What foods should I avoid? Fruits Raw (unpasteurized) fruit juices. Vegetables Unpasteurized vegetable juices. Meats and other protein foods Precooked or cured meat, such as bologna, hot dogs, sausages, or meat loaves. (If you must eat those   meats, reheat them until they are steaming hot.) Refrigerated pate, meat spreads from a meat counter, or smoked seafood that is found in the refrigerated section of a store. Raw or undercooked meats, poultry, and eggs. Raw fish, such as sushi or sashimi. Fish that have high mercury content, such as tilefish, shark, swordfish, and king mackerel. Dairy Unpasteurized milk and any foods that have unpasteurized milk in them. Soft cheeses, such as feta, queso blanco, queso fresco, Brie, Camembert, panela, and blue-veined cheeses (unless they are made with pasteurized milk, which must be stated on the label). Beverages Alcohol. Sugar-sweetened beverages, such as sodas, teas, or energy drinks. Seasoning and other foods Homemade fermented foods and drinks, such as  pickles, sauerkraut, or kombucha drinks. (Store-bought pasteurized versions of these are okay.) Salads that are made in a store or deli, such as ham salad, chicken salad, egg salad, tuna salad, and seafood salad. The items listed above may not be a complete list of foods and beverages you should avoid. Contact a dietitian for more information. Where to find more information To calculate the number of calories you need based on your height, weight, and activity level, you can use an online calculator such as: www.myplate.gov/myplate-plan To calculate how much weight you should gain during pregnancy, you can use an online pregnancy weight gain calculator such as: www.myplate.gov To learn more about eating fish during pregnancy, talk with your health care provider or visit: www.fda.gov Summary While you are pregnant, your body requires additional nutrition to help support your growing baby. Eat a variety of foods, especially fruits and vegetables, to get a full range of vitamins and minerals. Practice good food safety and cleanliness. Wash your hands before you eat and after you prepare raw meat. Wash all fruits and vegetables well before peeling or eating. Taking these actions can help to prevent foodborne illnesses, such as listeriosis, that can be very dangerous to your baby. Do not eat raw meat or fish. Do not eat fish that have high mercury content, such as tilefish, shark, swordfish, and king mackerel. Do not eat raw (unpasteurized) dairy. Take a prenatal vitamin to help meet your additional vitamin and mineral needs during pregnancy, specifically for folic acid, iron, calcium, and vitamin D. This information is not intended to replace advice given to you by your health care provider. Make sure you discuss any questions you have with your health care provider. Document Revised: 01/26/2020 Document Reviewed: 01/26/2020 Elsevier Patient Education  2022 [employer] Prenatal Care Prenatal care  is health care during pregnancy. It helps you and your unborn baby (fetus) stay as healthy as possible. Prenatal care may be provided by a midwife, a family practice doctor, a mid-level practitioner (nurse practitioner or physician assistant), or a childbirth and pregnancy doctor (obstetrician). How does this affect me? During pregnancy, you will be closely monitored for any new conditions that might develop. To lower your risk of pregnancy complications, you and your health care provider will talk about any underlying conditions you have. How does this affect my baby? Early and consistent prenatal care increases the chance that your baby will be healthy during pregnancy. Prenatal care lowers the risk that your baby will be: Born early (prematurely). Smaller than expected at birth (small for gestational age). What can I expect at the first prenatal care visit? Your first prenatal care visit will likely be the longest. You should schedule your first prenatal care visit as soon as you know that you are pregnant. Your first visit is   a good time to talk about any questions or concerns you have about pregnancy. Medical history At your visit, you and your health care provider will talk about your medical history, including: Any past pregnancies. Your family's medical history. Medical history of the baby's father. Any long-term (chronic) health conditions you have and how you manage them. Any surgeries or procedures you have had. Any current over-the-counter or prescription medicines, herbs, or supplements that you are taking. Other factors that could pose a risk to your baby, including: Exposure to harmful chemicals or radiation at work or at home. Any substance use, including tobacco, alcohol, and drug use. Your home setting and your stress levels, including: Exposure to abuse or violence. Household financial strain. Your daily health habits, including diet and exercise. Tests and screenings Your  health care provider will: Measure your weight, height, and blood pressure. Do a physical exam, including a pelvic and breast exam. Perform blood tests and urine tests to check for: Urinary tract infection. Sexually transmitted infections (STIs). Low iron levels in your blood (anemia). Blood type and certain proteins on red blood cells (Rh antibodies). Infections and immunity to viruses, such as hepatitis B and rubella. HIV (human immunodeficiency virus). Discuss your options for genetic screening. Tips about staying healthy Your health care provider will also give you information about how to keep yourself and your baby healthy, including: Nutrition and taking vitamins. Physical activity. How to manage pregnancy symptoms such as nausea and vomiting (morning sickness). Infections and substances that may be harmful to your baby and how to avoid them. Food safety. Dental care. Working. Travel. Warning signs to watch for and when to call your health care provider. How often will I have prenatal care visits? After your first prenatal care visit, you will have regular visits throughout your pregnancy. The visit schedule is often as follows: Up to week 28 of pregnancy: once every 4 weeks. 28-36 weeks: once every 2 weeks. After 36 weeks: every week until delivery. Some women may have visits more or less often depending on any underlying health conditions and the health of the baby. Keep all follow-up and prenatal care visits. This is important. What happens during routine prenatal care visits? Your health care provider will: Measure your weight and blood pressure. Check for fetal heart sounds. Measure the height of your uterus in your abdomen (fundal height). This may be measured starting around week 20 of pregnancy. Check the position of your baby inside your uterus. Ask questions about your diet, sleeping patterns, and whether you can feel the baby move. Review warning signs to watch  for and signs of labor. Ask about any pregnancy symptoms you are having and how you are dealing with them. Symptoms may include: Headaches. Nausea and vomiting. Vaginal discharge. Swelling. Fatigue. Constipation. Changes in your vision. Feeling persistently sad or anxious. Any discomfort, including back or pelvic pain. Bleeding or spotting. Make a list of questions to ask your health care provider at your routine visits. What tests might I have during prenatal care visits? You may have blood, urine, and imaging tests throughout your pregnancy, such as: Urine tests to check for glucose, protein, or signs of infection. Glucose tests to check for a form of diabetes that can develop during pregnancy (gestational diabetes mellitus). This is usually done around week 24 of pregnancy. Ultrasounds to check your baby's growth and development, to check for birth defects, and to check your baby's well-being. These can also help to decide when you should deliver your   baby. A test to check for group B strep (GBS) infection. This is usually done around week 36 of pregnancy. Genetic testing. This may include blood, fluid, or tissue sampling, or imaging tests, such as an ultrasound. Some genetic tests are done during the first trimester and some are done during the second trimester. What else can I expect during prenatal care visits? Your health care provider may recommend getting certain vaccines during pregnancy. These may include: A yearly flu shot (annual influenza vaccine). This is especially important if you will be pregnant during flu season. Tdap (tetanus, diphtheria, pertussis) vaccine. Getting this vaccine during pregnancy can protect your baby from whooping cough (pertussis) after birth. This vaccine may be recommended between weeks 27 and 36 of pregnancy. A COVID-19 vaccine. Later in your pregnancy, your health care provider may give you information about: Childbirth and breastfeeding  classes. Choosing a health care provider for your baby. Umbilical cord banking. Breastfeeding. Birth control after your baby is born. The hospital labor and delivery unit and how to set up a tour. Registering at the hospital before you go into labor. Where to find more information Office on Women's Health: womenshealth.gov American Pregnancy Association: americanpregnancy.org March of Dimes: marchofdimes.org Summary Prenatal care helps you and your baby stay as healthy as possible during pregnancy. Your first prenatal care visit will most likely be the longest. You will have visits and tests throughout your pregnancy to monitor your health and your baby's health. Bring a list of questions to your visits to ask your health care provider. Make sure to keep all follow-up and prenatal care visits. This information is not intended to replace advice given to you by your health care provider. Make sure you discuss any questions you have with your health care provider. Document Revised: 04/12/2020 Document Reviewed: 04/12/2020 Elsevier Patient Education  2022 [employer]  

## 2021-04-12 NOTE — Progress Notes (Signed)
New Obstetric Patient H&P    Chief Complaint: "Desires prenatal care"   History of Present Illness: Patient is a 25 y.o. [id number] Not Hispanic or Latino female, presents with amenorrhea and positive home pregnancy test. Patient's last menstrual period was 02/16/2021 (exact date). and based on her  LMP, her EDD is Estimated Date of Delivery: 11/23/21 and her EGA is [redacted]w[redacted]d. She had her IUD taken out in May of this year. Cycles are 4 days, regular, and occur approximately every : 28 days. Her last pap smear was 1 year ago and was no abnormalities.    She had a urine pregnancy test which was positive 4 week(s)  ago. Her last menstrual period was normal and lasted for  4 day(s). Since her LMP she claims she has experienced fatigue, nausea. She denies vaginal bleeding. Her past medical history is noncontributory. Her prior pregnancies are notable for  IUGR  Since her LMP, she admits to the use of tobacco products  yes she is trying to quit and has decreased from 1ppd to 10cpd She claims she has gained  1  pounds since the start of her pregnancy.  There are cats in the home in the home  no  She admits close contact with children on a regular basis  yes  She has had chicken pox in the past no She has had Tuberculosis exposures, symptoms, or previously tested positive for TB   no Current or past history of domestic violence. no  Genetic Screening/Teratology Counseling: (Includes patient, baby's father, or anyone in either family with:)   1. Patient's age >/= 49 at [facility]  no 2. Thalassemia ([place], [place], Mediterranean, or Asian background): MCV<80  no 3. Neural tube defect (meningomyelocele, spina bifida, anencephaly)  no 4. Congenital heart defect  no  5. Down syndrome  no 6. Tay-Sachs (Jewish, [place]  no 7. Canavan's Disease  no 8. Sickle cell disease or trait (African)  no  9. Hemophilia or other blood disorders  no  10. Muscular dystrophy  no  11. Cystic fibrosis  no  12.  Huntington's Chorea  no  13. Mental retardation/autism  Husband's mother and sister have learning disability, Patient's mother has schizophrenia/bipolar 14. Other inherited genetic or chromosomal disorder  no 15. Maternal metabolic disorder (DM, PKU, etc)  no 16. Patient or FOB with a child with a birth defect not listed above no  16a. Patient or FOB with a birth defect themselves no 17. Recurrent pregnancy loss, or stillbirth  no  18. Any medications since LMP other than prenatal vitamins (include vitamins, supplements, OTC meds, drugs, alcohol)  no 19. Any other genetic/environmental exposure to discuss  no  Infection History:   1. Lives with someone with TB or TB exposed  no  2. Patient or partner has history of genital herpes  no 3. Rash or viral illness since LMP  no 4. History of STI (GC, CT, HPV, syphilis, HIV)  no 5. History of recent travel :  no  Other pertinent information:  no     Review of Systems:10 point review of systems negative unless otherwise noted in HPI  Past Medical History:  Patient Active Problem List   Diagnosis Date Noted   Supervision of high-risk pregnancy 04/12/2021     Nursing Staff Provider  Office Location  [facility] Dating    Language  English Anatomy US    Flu Vaccine   Genetic Screen  NIPS:   TDaP vaccine    Hgb A1C or  GTT Early : Third trimester :   Covid    LAB RESULTS   Rhogam   Blood Type --/--/A POS Performed at [facility], [address] [place], [place] [address]  [phone])   Feeding Plan  Antibody    Contraception  Rubella    Circumcision  RPR     Pediatrician   HBsAg     Support Person  HIV    Prenatal Classes  Varicella     GBS  (For PCN allergy, check sensitivities)   BTL Consent     VBAC Consent  Pap  2021 negative    Hgb Electro    Pelvis Tested  CF      SMA        Hx LEEP in 2019 Hx IOL x2 IUGR 2014/2017    Obesity affecting pregnancy 04/12/2021   Left nephrolithiasis 03/30/2019    Nephrolithiasis 03/29/2019   Status post LEEP (loop electrosurgical excision procedure) of cervix 05/03/2018    DIAGNOSIS:  A. 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  - LOW-GRADE SQUAMOUS INTRAEPITHELIAL LESION (LSIL / CIN1) INVOLVING ONE  QUADRANT; MARGINS ARE NEGATIVE.   B.  ENDOCERVICAL LEEP:  - ENDOCERVICAL TISSUE; NEGATIVE FOR DYSPLASIA AND MALIGNANCY.   C.  ENDOCERVICAL CURETTINGS, POST LEEP:  - STRIPS OF ENDOCERVICAL GLANDULAR EPITHELIUM WITH FOCAL SQUAMOUS  METAPLASIA.  - NEGATIVE FOR DYSPLASIA AND MALIGNANCY.     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 Endocervix, curettage - SCANT STRIPS OF BENIGN CERVICAL MUCOSA. - NEGATIVE FOR CARCINOMA. 2. Cervix, biopsy, at 11 o'clock - HIGH GRADE SQUAMOUS INTRAEPITHELIAL LESION (HSIL, CIN-II). 3. Cervix, biopsy, at 12 o'clock - LOW GRADE SQUAMOUS INTRAEPITHELIAL LESION (LSIL, CIN-I).  05/03/2018 LEEP cone biopsy  Ectocervical LEEP-  Endocervical LEEP-  Post LEEP ECC-    Tobacco user 02/25/2018   Heart murmur 05/03/2012   URI, acute 05/03/2012   Pyelonephritis 12/02/2011    Past Surgical History:  Past Surgical History:  Procedure Laterality Date   EXTRACORPOREAL SHOCK WAVE LITHOTRIPSY Left 04/14/2019   Procedure: EXTRACORPOREAL SHOCK WAVE LITHOTRIPSY (ESWL);  Surgeon: Riki Altes, MD;  Location: [facility];  Service: Urology;  Laterality: Left;   EXTRACORPOREAL SHOCK WAVE LITHOTRIPSY Right 05/05/2019   Procedure: EXTRACORPOREAL SHOCK WAVE LITHOTRIPSY (ESWL);  Surgeon: Riki Altes, MD;  Location: [facility];  Service: Urology;  Laterality: Right;   LEEP N/A 05/03/2018   Procedure: LOOP ELECTROSURGICAL EXCISION PROCEDURE (LEEP);  Surgeon: Herold Harms, MD;  Location: [facility];  Service: Gynecology;  Laterality: N/A;   REMOVAL OF DRUG DELIVERY IMPLANT Left  12/15/2014   Procedure: REMOVAL OF DRUG DELIVERY IMPLANT;  Surgeon: Nadara Mustard, MD;  Location: [facility] OR[facility];  Service: Gynecology;  Laterality: Left;    Gynecologic History: Patient's last menstrual period was 02/16/2021 (exact date).  Obstetric History: [id number]  Family History:  Family History  Problem Relation Age of Onset   Hepatitis C Mother    Hepatitis C Father    Breast cancer Neg Hx    Ovarian cancer Neg Hx    Colon cancer Neg Hx     Social History:  Social History   Socioeconomic History   Marital status: Married    Spouse name: Seward Grater   Number of children: Not on file   Years of education: Not on file   Highest education level: Not on file  Occupational History   Not on file  Tobacco Use   Smoking status: Every Day    Packs/day: 1.00    Years: 5.00    Pack years: 5.00    Types: Cigarettes   Smokeless tobacco: Never  Vaping Use   Vaping Use: Never used  Substance and Sexual Activity   Alcohol use: Yes    Comment: occasional    Drug use: No   Sexual activity: Yes    Birth control/protection: I.U.D.    Comment: Mirena removed June or July 2022  Other Topics Concern   Not on file  Social History Narrative   Not on file   Social Determinants of Health   Financial Resource Strain: Not on file  Food Insecurity: Not on file  Transportation Needs: Not on file  Physical Activity: Not on file  Stress: Not on file  Social Connections: Not on file  Intimate Partner Violence: Not At Risk   Fear of Current or Ex-Partner: No   Emotionally Abused: No   Physically Abused: No   Sexually Abused: No    Allergies:  Allergies  Allergen Reactions   Kiwi Extract Anaphylaxis   Amoxicillin Hives, Rash and Other (See Comments)    Blisters Has patient had a PCN reaction causing immediate rash, facial/tongue/throat swelling, SOB or lightheadedness with hypotension: No Has patient had a PCN reaction causing severe rash involving mucus membranes or skin  necrosis: Yes Has patient had a PCN reaction that required hospitalization: No Has patient had a PCN reaction occurring within the last 10 years: Unknown If all of the above answers are "NO", then may proceed with Cephalosporin use.    Ciprofloxacin Hives    blisters   Penicillins Rash and Other (See Comments)    Has patient had a PCN reaction causing immediate rash, facial/tongue/throat swelling, SOB or lightheadedness with hypotension:no Has patient had a PCN reaction causing severe rash involving mucus membranes or skin necrosis: yes Has patient had a PCN reaction that required hospitalization no Has patient had a PCN reaction occurring within the last 10 years: yes If all of the above answers are "NO", then may proceed with Cephalosporin use.    Tramadol Itching and Hives   Cranberry Rash   Latex Rash and Hives   Sulfa Antibiotics Rash and Other (See Comments)    BLISTERS    Medications: Prior to Admission medications   Medication Sig Start Date End Date Taking? Authorizing Provider  Prenatal Vit-Fe Fumarate-FA (MULTIVITAMIN-PRENATAL) 27-0.8 MG TABS tablet Take 1 tablet by mouth daily at 12 noon.   Yes [provider]  promethazine (PHENERGAN) 25 MG tablet Take 1 tablet (25 mg total) by mouth every 8 (eight) hours as needed for nausea or vomiting. 10/18/20 11/13/20  Couture, Cortni S, PA-C    Physical Exam Vitals: Blood pressure 122/76, pulse (!) 101, weight 221 lb (100.2 kg), last menstrual period 02/16/2021.  General: NAD HEENT: normocephalic, anicteric Thyroid: no enlargement, no palpable nodules Pulmonary: No increased work of breathing, CTAB Cardiovascular: RRR, distal pulses 2+ Abdomen: NABS, soft, non-tender, non-distended.  Umbilicus without lesions.  No hepatomegaly, splenomegaly or masses palpable. No evidence of hernia  Genitourinary: exam limited by body habitus  External: Normal external female genitalia.  Normal urethral meatus, normal Bartholin's and  Skene's glands.    Vagina: Normal vaginal mucosa, no evidence of prolapse.   Extremities: no edema, erythema, or tenderness Neurologic: Grossly intact Psychiatric: mood appropriate, affect full   The following were addressed during this visit:  Breastfeeding Education - Early initiation of breastfeeding    Comments: Keeps milk supply adequate, helps contract uterus and slow bleeding, and early milk is the perfect first food and is easy to digest.   - The importance of exclusive breastfeeding    Comments: Provides antibodies, Lower risk of breast and ovarian cancers, and type-2 diabetes,Helps your body recover, Reduced chance of SIDS.   - Risks of giving your baby anything other than breast milk if you are breastfeeding    Comments: Make the baby less content with breastfeeds, may make my baby more susceptible to illness, and may reduce my milk supply.   - The importance of early skin-to-skin contact    Comments:  Keeps baby warm and secure, helps keep baby's blood sugar up and breathing steady, easier to bond and breastfeed, and helps calm baby.  - Rooming-in on a 24-hour basis    Comments: Easier to learn baby's feeding cues, easier to bond and get to know each other, and encourages milk production.   - Feeding on demand or baby-led feeding    Comments: Helps prevent breastfeeding complications, helps bring in good milk supply, prevents under or overfeeding, and helps baby feel content and satisfied   - Frequent feeding to help assure optimal milk production    Comments: Making a full supply of milk requires frequent removal of milk from breasts, infant will eat 8-12 times in 24 hours, if separated from infant use breast massage, hand expression and/ or pumping to remove milk from breasts.   - Effective positioning and attachment    Comments: Helps my baby to get enough breast milk, helps to produce an adequate milk supply, and helps prevent nipple pain and damage   -  Exclusive breastfeeding for the first 6 months    Comments: Builds a healthy milk supply and keeps it up, protects baby from sickness and disease, and breastmilk has everything your baby needs for the first 6 months.  - Individualized Education    Comments: Contraindications to breastfeeding and other special medical conditions Patient attempted to breastfeed her first baby without success. She has pierced nipples. She may want to try breastfeeding this baby.    Assessment: 66 y.o. [id number] at [redacted]w[redacted]d presenting to initiate prenatal care  Plan: 1) Avoid alcoholic beverages. 2) Patient encouraged not to smoke.  3) Discontinue the use of all non-medicinal drugs and chemicals.  4) Take prenatal vitamins daily.  5) Nutrition, food safety (fish, cheese advisories, and high nitrite foods) and exercise discussed. 6) Hospital and practice style discussed with cross coverage system.  7) Genetic Screening, such as with 1st Trimester Screening, cell free fetal DNA, AFP testing, and Ultrasound, as well as with amniocentesis and CVS as appropriate, is discussed with patient. At the conclusion of today's visit patient requested genetic testing 8) Patient is asked about travel to areas at risk for the Zika virus, and counseled to avoid travel and exposure to mosquitoes or sexual partners who may have themselves been exposed to the virus. Testing is discussed, and will be ordered as appropriate.  9) Aptima today 10) Return to clinic in 1 week for viability, labs including urine culture, rob 11) MaterniT 21 at 10+ weeks   Tresea Mall, CNM [facility] 04/12/2021, 1:31 PM

## 2021-04-13 DIAGNOSIS — Z419 Encounter for procedure for purposes other than remedying health state, unspecified: Secondary | ICD-10-CM | POA: Diagnosis not present

## 2021-04-15 LAB — CERVICOVAGINAL ANCILLARY ONLY
Chlamydia: NEGATIVE
Comment: NEGATIVE
Comment: NEGATIVE
Comment: NORMAL
Neisseria Gonorrhea: NEGATIVE
Trichomonas: NEGATIVE

## 2021-04-16 ENCOUNTER — Telehealth: Payer: Medicaid Other | Admitting: Physician Assistant

## 2021-04-16 DIAGNOSIS — J208 Acute bronchitis due to other specified organisms: Secondary | ICD-10-CM

## 2021-04-16 NOTE — Progress Notes (Signed)
We are sorry that you are not feeling well.  Here is how we plan to help!  Based on your presentation I believe you most likely have A cough due to a virus.  This is called viral bronchitis and is best treated by rest, plenty of fluids and control of the cough.  You may use Ibuprofen or Tylenol as directed to help your symptoms.     In addition you may use A non-prescription cough medication called Robitussin DAC. Take 2 teaspoons every 8 hours or Delsym: take 2 teaspoons every 12 hours.  Get ample rest - Take naps, sleep through the night, and sit down to relax. These are great ways to give your body much-needed downtime. Learn more about the importance of bed rest during pregnancy. Drink plenty of fluids - Drink water, juice, or broth to add necessary fluids back into your body. Eat well - Even if you cannot stomach larger meals, try eating small portions often. Reduce congestion - Place a humidifier in your room, keep your head elevated on your pillow while resting, or use nasal strips. Alleviate your sore throat - Suck on ice chips, drink warm tea, or gargle with warm salt water. It is best to reduce the number of over-the-counter medications you take. Many medications you normally would use to treat the symptoms of your cold are not safe to take during your pregnancy. The following is a list of medications that pose little risk to your baby during pregnancy; however, it is best to consult with your doctor before taking any medications to relieve your symptoms.  Acetaminophen (i.e. Tylenol) can be used to alleviate fevers, headaches, and body aches. Anesthetic sore throat lozenges can ease the pain in your throat. Codeine and dextromethorphan can often be used as cough suppressants.  From your responses in the eVisit questionnaire you describe inflammation in the upper respiratory tract which is causing a significant cough.  This is commonly called Bronchitis and has four common causes:    Allergies Viral Infections Acid Reflux Bacterial Infection Allergies, viruses and acid reflux are treated by controlling symptoms or eliminating the cause. An example might be a cough caused by taking certain blood pressure medications. You stop the cough by changing the medication. Another example might be a cough caused by acid reflux. Controlling the reflux helps control the cough.  USE OF BRONCHODILATOR ("RESCUE") INHALERS: There is a risk from using your bronchodilator too frequently.  The risk is that over-reliance on a medication which only relaxes the muscles surrounding the breathing tubes can reduce the effectiveness of medications prescribed to reduce swelling and congestion of the tubes themselves.  Although you feel brief relief from the bronchodilator inhaler, your asthma may actually be worsening with the tubes becoming more swollen and filled with mucus.  This can delay other crucial treatments, such as oral steroid medications. If you need to use a bronchodilator inhaler daily, several times per day, you should discuss this with your provider.  There are probably better treatments that could be used to keep your asthma under control.     HOME CARE Only take medications as instructed by your medical team. Complete the entire course of an antibiotic. Drink plenty of fluids and get plenty of rest. Avoid close contacts especially the very young and the elderly Cover your mouth if you cough or cough into your sleeve. Always remember to wash your hands A steam or ultrasonic humidifier can help congestion.   GET HELP RIGHT AWAY IF: You  develop worsening fever. You become short of breath You cough up blood. Your symptoms persist after you have completed your treatment plan MAKE SURE YOU  Understand these instructions. Will watch your condition. Will get help right away if you are not doing well or get worse.    Thank you for choosing an e-visit.  Your e-visit answers were  reviewed by a board certified advanced clinical practitioner to complete your personal care plan. Depending upon the condition, your plan could have included both over the counter or prescription medications.  Please review your pharmacy choice. Make sure the pharmacy is open so you can pick up prescription now. If there is a problem, you may contact your provider through [employer] and have the prescription routed to another pharmacy.  Your safety is important to [place]. If you have drug allergies check your prescription carefully.   For the next 24 hours you can use MyChart to ask questions about today's visit, request a non-urgent call back, or ask for a work or school excuse. You will get an email in the next two days asking about your experience. I hope that your e-visit has been valuable and will speed your recovery.  I provided 6 minutes of non face-to-face time during this encounter for chart review and documentation.

## 2021-04-17 DIAGNOSIS — R0982 Postnasal drip: Secondary | ICD-10-CM | POA: Diagnosis not present

## 2021-04-17 DIAGNOSIS — R058 Other specified cough: Secondary | ICD-10-CM | POA: Diagnosis not present

## 2021-04-24 ENCOUNTER — Encounter: Payer: Self-pay | Admitting: Obstetrics and Gynecology

## 2021-04-24 ENCOUNTER — Encounter: Payer: Medicaid Other | Admitting: Obstetrics and Gynecology

## 2021-04-25 ENCOUNTER — Other Ambulatory Visit: Payer: Self-pay

## 2021-04-25 ENCOUNTER — Ambulatory Visit (INDEPENDENT_AMBULATORY_CARE_PROVIDER_SITE_OTHER): Payer: Medicaid Other | Admitting: Obstetrics and Gynecology

## 2021-04-25 VITALS — BP 126/82 | Wt 216.0 lb

## 2021-04-25 DIAGNOSIS — Z363 Encounter for antenatal screening for malformations: Secondary | ICD-10-CM

## 2021-04-25 DIAGNOSIS — O99211 Obesity complicating pregnancy, first trimester: Secondary | ICD-10-CM

## 2021-04-25 DIAGNOSIS — Z3689 Encounter for other specified antenatal screening: Secondary | ICD-10-CM | POA: Diagnosis not present

## 2021-04-25 DIAGNOSIS — Z1379 Encounter for other screening for genetic and chromosomal anomalies: Secondary | ICD-10-CM

## 2021-04-25 DIAGNOSIS — Z31438 Encounter for other genetic testing of female for procreative management: Secondary | ICD-10-CM

## 2021-04-25 DIAGNOSIS — O0991 Supervision of high risk pregnancy, unspecified, first trimester: Secondary | ICD-10-CM

## 2021-04-25 DIAGNOSIS — Z369 Encounter for antenatal screening, unspecified: Secondary | ICD-10-CM

## 2021-04-25 LAB — POCT URINALYSIS DIPSTICK OB
Glucose, UA: NEGATIVE
POC,PROTEIN,UA: NEGATIVE

## 2021-04-25 NOTE — Progress Notes (Signed)
ROB - viability scan, no concerns.

## 2021-04-25 NOTE — Progress Notes (Signed)
Routine Prenatal Care Visit  Subjective  Brooke Aguirre is a 25 y.o. G3P2002 at [redacted]w[redacted]d being seen today for ongoing prenatal care.  She is currently monitored for the following issues for this high-risk pregnancy and has Dysplasia of cervix, high grade CIN 2; Tobacco user; Status post LEEP (loop electrosurgical excision procedure) of cervix; Nephrolithiasis; Left nephrolithiasis; Heart murmur; Pyelonephritis; URI, acute; Supervision of high-risk pregnancy; and Obesity affecting pregnancy on their problem list.  ----------------------------------------------------------------------------------- Patient reports no complaints.    . Vag. Bleeding: None.  Movement: Absent. Denies leaking of fluid.  ----------------------------------------------------------------------------------- The following portions of the patient's history were reviewed and updated as appropriate: allergies, current medications, past family history, past medical history, past social history, past surgical history and problem list. Problem list updated.   Objective  Blood pressure 126/82, weight 216 lb (98 kg), last menstrual period 02/16/2021. Pregravid weight 220 lb (99.8 kg) Total Weight Gain -4 lb (-1.814 kg) Urinalysis:      Fetal Status: Fetal Heart Rate (bpm): 180   Movement: Absent     General:  Alert, oriented and cooperative. Patient is in no acute distress.  Skin: Skin is warm and dry. No rash noted.   Cardiovascular: Normal heart rate noted  Respiratory: Normal respiratory effort, no problems with respiration noted  Abdomen: Soft, gravid, appropriate for gestational age. Pain/Pressure: Absent     Pelvic:  Cervical exam deferred        Extremities: Normal range of motion.     ental Status: Normal mood and affect. Normal behavior. Normal judgment and thought content.   TVUS today   Singleton viable IUP CRL 9w1 giving and ultrasound EEstimated Date of Delivery: 11/23/21 consistent with LMP dating  FHT  160BPM ROV normal LOV normal YS 0.42cm No evidence of uterine fibroids No free fluid  There is a viable singleton gestation.  The fetal biometry correlates with established dating. Detailed evaluation of the fetal anatomy is precluded by early gestational age.  It must be noted that a normal ultrasound particular at this early gestational age is unable to rule out fetal aneuploidy, risk of first trimester miscarriage, or anatomic birth defects.  Vena Austria, MD, [facility], [facility]  Assessment   25 y.o. [phone] at [redacted]w[redacted]d by  11/23/2021, by Last Menstrual Period presenting for routine prenatal visit  Plan   pregnancy 3 Problems (from 04/12/21 to present)     Problem Noted Resolved   Supervision of high-risk pregnancy 04/12/2021 by Tresea Mall, CNM No   Overview Signed 04/12/2021  1:26 PM by Tresea Mall, CNM     Nursing Staff Provider  Office Location  [facility] Dating    Language  English Anatomy US    Flu Vaccine   Genetic Screen  NIPS:   TDaP vaccine    Hgb A1C or  GTT Early : Third trimester :   Covid    LAB RESULTS   Rhogam   Blood Type --/--/A POS Performed at [facility], [address] [place], [place] [address]  [phone])   Feeding Plan  Antibody    Contraception  Rubella    Circumcision  RPR     Pediatrician   HBsAg     Support Person  HIV    Prenatal Classes  Varicella     GBS  (For PCN allergy, check sensitivities)   BTL Consent     VBAC Consent  Pap  2021 negative    Hgb Electro  Pelvis Tested  CF      SMA        Hx LEEP in 2019 Hx IOL x2 IUGR 2014/2017      Obesity affecting pregnancy 04/12/2021 by Tresea Mall, CNM No        Gestational age appropriate obstetric precautions including but not limited to vaginal bleeding, contractions, leaking of fluid and fetal movement were reviewed in detail with the patient.    Return in about 1 week (around 05/02/2021) for 1 week labs visit, 4 week ROB and  early 1-hr (orders in).  Vena [place], MD, [facility], [facility]

## 2021-05-02 ENCOUNTER — Other Ambulatory Visit: Payer: Medicaid Other

## 2021-05-02 ENCOUNTER — Other Ambulatory Visit: Payer: Self-pay

## 2021-05-02 DIAGNOSIS — Z31438 Encounter for other genetic testing of female for procreative management: Secondary | ICD-10-CM | POA: Diagnosis not present

## 2021-05-02 DIAGNOSIS — Z113 Encounter for screening for infections with a predominantly sexual mode of transmission: Secondary | ICD-10-CM

## 2021-05-02 DIAGNOSIS — Z369 Encounter for antenatal screening, unspecified: Secondary | ICD-10-CM | POA: Diagnosis not present

## 2021-05-02 DIAGNOSIS — O99211 Obesity complicating pregnancy, first trimester: Secondary | ICD-10-CM | POA: Diagnosis not present

## 2021-05-02 DIAGNOSIS — Z1379 Encounter for other screening for genetic and chromosomal anomalies: Secondary | ICD-10-CM | POA: Diagnosis not present

## 2021-05-02 DIAGNOSIS — O0991 Supervision of high risk pregnancy, unspecified, first trimester: Secondary | ICD-10-CM

## 2021-05-02 DIAGNOSIS — Z1159 Encounter for screening for other viral diseases: Secondary | ICD-10-CM | POA: Diagnosis not present

## 2021-05-03 ENCOUNTER — Other Ambulatory Visit: Payer: Self-pay | Admitting: Obstetrics and Gynecology

## 2021-05-03 DIAGNOSIS — O0991 Supervision of high risk pregnancy, unspecified, first trimester: Secondary | ICD-10-CM

## 2021-05-03 DIAGNOSIS — O99810 Abnormal glucose complicating pregnancy: Secondary | ICD-10-CM

## 2021-05-03 DIAGNOSIS — O99211 Obesity complicating pregnancy, first trimester: Secondary | ICD-10-CM

## 2021-05-03 LAB — RPR+RH+ABO+RUB AB+AB SCR+CB...
Antibody Screen: NEGATIVE
HIV Screen 4th Generation wRfx: NONREACTIVE
Hematocrit: 36.3 % (ref 34.0–46.6)
Hemoglobin: 13.1 g/dL (ref 11.1–15.9)
Hepatitis B Surface Ag: NEGATIVE
MCH: 33.4 pg — ABNORMAL HIGH (ref 26.6–33.0)
MCHC: 36.1 g/dL — ABNORMAL HIGH (ref 31.5–35.7)
MCV: 93 fL (ref 79–97)
Platelets: 233 10*3/uL (ref 150–450)
RBC: 3.92 x10E6/uL (ref 3.77–5.28)
RDW: 10.6 % — ABNORMAL LOW (ref 11.7–15.4)
RPR Ser Ql: NONREACTIVE
Rh Factor: POSITIVE
Rubella Antibodies, IGG: <0.9 index — ABNORMAL LOW (ref >0.99)
Varicella zoster IgG: <135 index — ABNORMAL LOW (ref >165)
WBC: 8.7 10*3/uL (ref 3.4–10.8)

## 2021-05-03 LAB — GLUCOSE TOLERANCE, 1 HOUR: Glucose, 1Hr PP: 148 mg/dL (ref 70–199)

## 2021-05-03 LAB — HEPATITIS C ANTIBODY: Hep C Virus Ab: <0.1 s/co ratio (ref 0.0–0.9)

## 2021-05-03 NOTE — Progress Notes (Signed)
Abnormal 1-hr, 3-hr ordered

## 2021-05-03 NOTE — Progress Notes (Signed)
Needs 3-hr OGTT patient aware and order is in, sometime in the next 1-2 weeks

## 2021-05-05 LAB — URINE CULTURE

## 2021-05-06 LAB — MATERNIT 21 PLUS CORE, BLOOD
Fetal Fraction: 7
Result (T21): NEGATIVE
Trisomy 13 (Patau syndrome): NEGATIVE
Trisomy 18 (Edwards syndrome): NEGATIVE
Trisomy 21 (Down syndrome): NEGATIVE

## 2021-05-07 ENCOUNTER — Other Ambulatory Visit: Payer: Self-pay | Admitting: Obstetrics and Gynecology

## 2021-05-07 DIAGNOSIS — Z20820 Contact with and (suspected) exposure to varicella: Secondary | ICD-10-CM | POA: Insufficient documentation

## 2021-05-07 MED ORDER — ACYCLOVIR 400 MG PO TABS: 800.0000 mg | ORAL_TABLET | Freq: Every day | ORAL | 0 refills | Status: AC

## 2021-05-07 NOTE — Progress Notes (Signed)
Patient varicella non-immune exposure to shingles house hold contact.  Discussed with ID and Dr Parke Poisson MFM.  Patient given option of acyclovir prophylaxis 800mg  po 5 times a day for 7 days

## 2021-05-08 ENCOUNTER — Other Ambulatory Visit: Payer: Medicaid Other

## 2021-05-10 ENCOUNTER — Telehealth: Payer: Medicaid Other | Admitting: Physician Assistant

## 2021-05-10 DIAGNOSIS — J029 Acute pharyngitis, unspecified: Secondary | ICD-10-CM | POA: Diagnosis not present

## 2021-05-10 NOTE — Progress Notes (Signed)
E-Visit for Sore Throat  We are sorry that you are not feeling well.  Here is how we plan to help!  Your symptoms indicate a likely viral infection (Pharyngitis).   Pharyngitis is inflammation in the back of the throat which can cause a sore throat, scratchiness and sometimes difficulty swallowing.   Pharyngitis is typically caused by a respiratory virus and will just run its course.  Please keep in mind that your symptoms could last up to 10 days.  For throat pain, we recommend over the counter oral pain relief medications such as acetaminophen (Tylenol; safe in pregnancy).  Topical treatments such as oral throat lozenges or sprays may be used as needed.  Salt water gargles can help reduce inflammation as well. Avoid close contact with loved ones, especially the very young and elderly.  Remember to wash your hands thoroughly throughout the day as this is the number one way to prevent the spread of infection and wipe down door knobs and counters with disinfectant.  After careful review of your answers, I would not recommend and antibiotic for your condition.  Antibiotics should not be used to treat conditions that we suspect are caused by viruses like the virus that causes the common cold or flu. However, some people can have Strep with atypical symptoms. You may need formal testing in clinic or office to confirm if your symptoms continue or worsen.  Providers prescribe antibiotics to treat infections caused by bacteria. Antibiotics are very powerful in treating bacterial infections when they are used properly.  To maintain their effectiveness, they should be used only when necessary.  Overuse of antibiotics has resulted in the development of super bugs that are resistant to treatment!    Home Care: Only take medications as instructed by your medical team. Do not drink alcohol while taking these medications. A steam or ultrasonic humidifier can help congestion.  You can place a towel over your head  and breathe in the steam from hot water coming from a faucet. Avoid close contacts especially the very young and the elderly. Cover your mouth when you cough or sneeze. Always remember to wash your hands.  Get Help Right Away If: You develop worsening fever or throat pain. You develop a severe head ache or visual changes. Your symptoms persist after you have completed your treatment plan.  Make sure you Understand these instructions. Will watch your condition. Will get help right away if you are not doing well or get worse.   Thank you for choosing an e-visit.  Your e-visit answers were reviewed by a board certified advanced clinical practitioner to complete your personal care plan. Depending upon the condition, your plan could have included both over the counter or prescription medications.  Please review your pharmacy choice. Make sure the pharmacy is open so you can pick up prescription now. If there is a problem, you may contact your provider through [employer] and have the prescription routed to another pharmacy.  Your safety is important to [place]. If you have drug allergies check your prescription carefully.   For the next 24 hours you can use MyChart to ask questions about today's visit, request a non-urgent call back, or ask for a work or school excuse. You will get an email in the next two days asking about your experience. I hope that your e-visit has been valuable and will speed your recovery.  I provided 5 minutes of non face-to-face time during this encounter for chart review and documentation.

## 2021-05-14 DIAGNOSIS — Z419 Encounter for procedure for purposes other than remedying health state, unspecified: Secondary | ICD-10-CM | POA: Diagnosis not present

## 2021-05-15 ENCOUNTER — Other Ambulatory Visit: Payer: Medicaid Other

## 2021-05-15 ENCOUNTER — Telehealth: Payer: Medicaid Other | Admitting: Physician Assistant

## 2021-05-15 DIAGNOSIS — J208 Acute bronchitis due to other specified organisms: Secondary | ICD-10-CM

## 2021-05-15 DIAGNOSIS — B9689 Other specified bacterial agents as the cause of diseases classified elsewhere: Secondary | ICD-10-CM

## 2021-05-15 MED ORDER — AZITHROMYCIN 250 MG PO TABS: ORAL_TABLET | ORAL | 0 refills | Status: DC

## 2021-05-15 NOTE — Progress Notes (Signed)
We are sorry that you are not feeling well.  Here is how we plan to help!  Based on your presentation I believe you most likely have A cough due to bacteria.  When patients have a fever and a productive cough with a change in color or increased sputum production, we are concerned about bacterial bronchitis.  If left untreated it can progress to pneumonia.  If your symptoms do not improve with your treatment plan it is important that you contact your provider.   I have prescribed Azithromyin 250 mg: two tablets now and then one tablet daily for 4 additonal days    In addition you may use A non-prescription cough medication called Robitussin DAC. Take 2 teaspoons every 8 hours or Delsym: take 2 teaspoons every 12 hours.   From your responses in the eVisit questionnaire you describe inflammation in the upper respiratory tract which is causing a significant cough.  This is commonly called Bronchitis and has four common causes:   Allergies Viral Infections Acid Reflux Bacterial Infection Allergies, viruses and acid reflux are treated by controlling symptoms or eliminating the cause. An example might be a cough caused by taking certain blood pressure medications. You stop the cough by changing the medication. Another example might be a cough caused by acid reflux. Controlling the reflux helps control the cough.  USE OF BRONCHODILATOR ("RESCUE") INHALERS: There is a risk from using your bronchodilator too frequently.  The risk is that over-reliance on a medication which only relaxes the muscles surrounding the breathing tubes can reduce the effectiveness of medications prescribed to reduce swelling and congestion of the tubes themselves.  Although you feel brief relief from the bronchodilator inhaler, your asthma may actually be worsening with the tubes becoming more swollen and filled with mucus.  This can delay other crucial treatments, such as oral steroid medications. If you need to use a bronchodilator  inhaler daily, several times per day, you should discuss this with your provider.  There are probably better treatments that could be used to keep your asthma under control.     HOME CARE Only take medications as instructed by your medical team. Complete the entire course of an antibiotic. Drink plenty of fluids and get plenty of rest. Avoid close contacts especially the very young and the elderly Cover your mouth if you cough or cough into your sleeve. Always remember to wash your hands A steam or ultrasonic humidifier can help congestion.   GET HELP RIGHT AWAY IF: You develop worsening fever. You become short of breath You cough up blood. Your symptoms persist after you have completed your treatment plan MAKE SURE YOU  Understand these instructions. Will watch your condition. Will get help right away if you are not doing well or get worse.    Thank you for choosing an e-visit.  Your e-visit answers were reviewed by a board certified advanced clinical practitioner to complete your personal care plan. Depending upon the condition, your plan could have included both over the counter or prescription medications.  Please review your pharmacy choice. Make sure the pharmacy is open so you can pick up prescription now. If there is a problem, you may contact your provider through [employer] and have the prescription routed to another pharmacy.  Your safety is important to [place]. If you have drug allergies check your prescription carefully.   For the next 24 hours you can use MyChart to ask questions about today's visit, request a non-urgent call back, or ask  for a work or school excuse. You will get an email in the next two days asking about your experience. I hope that your e-visit has been valuable and will speed your recovery.  I provided 6 minutes of non face-to-face time during this encounter for chart review and documentation.   

## 2021-05-16 ENCOUNTER — Emergency Department
Admission: EM | Admit: 2021-05-16 | Discharge: 2021-05-16 | Disposition: A | Discharge status: Home / Self Care | Payer: Medicaid Other | Attending: Emergency Medicine | Admitting: Emergency Medicine

## 2021-05-16 ENCOUNTER — Other Ambulatory Visit: Payer: Self-pay

## 2021-05-16 DIAGNOSIS — Z91040 Latex allergy status: Secondary | ICD-10-CM | POA: Insufficient documentation

## 2021-05-16 DIAGNOSIS — O99511 Diseases of the respiratory system complicating pregnancy, first trimester: Secondary | ICD-10-CM | POA: Diagnosis not present

## 2021-05-16 DIAGNOSIS — Z20822 Contact with and (suspected) exposure to covid-19: Secondary | ICD-10-CM | POA: Diagnosis not present

## 2021-05-16 DIAGNOSIS — N189 Chronic kidney disease, unspecified: Secondary | ICD-10-CM | POA: Insufficient documentation

## 2021-05-16 DIAGNOSIS — J40 Bronchitis, not specified as acute or chronic: Secondary | ICD-10-CM | POA: Diagnosis not present

## 2021-05-16 DIAGNOSIS — F17210 Nicotine dependence, cigarettes, uncomplicated: Secondary | ICD-10-CM | POA: Diagnosis not present

## 2021-05-16 DIAGNOSIS — R059 Cough, unspecified: Secondary | ICD-10-CM | POA: Diagnosis present

## 2021-05-16 DIAGNOSIS — E039 Hypothyroidism, unspecified: Secondary | ICD-10-CM | POA: Insufficient documentation

## 2021-05-16 DIAGNOSIS — J069 Acute upper respiratory infection, unspecified: Secondary | ICD-10-CM | POA: Diagnosis not present

## 2021-05-16 DIAGNOSIS — B9789 Other viral agents as the cause of diseases classified elsewhere: Secondary | ICD-10-CM | POA: Diagnosis not present

## 2021-05-16 LAB — INHERITEST CORE(CF97,SMA,FRAX)

## 2021-05-16 LAB — RESP PANEL BY RT-PCR (FLU A&B, COVID) ARPGX2
Influenza A by PCR: NEGATIVE
Influenza B by PCR: NEGATIVE
SARS Coronavirus 2 by RT PCR: NEGATIVE

## 2021-05-16 MED ORDER — ONDANSETRON 4 MG PO TBDP: 4.0000 mg | ORAL_TABLET | Freq: Three times a day (TID) | ORAL | 0 refills | Status: DC | PRN

## 2021-05-16 MED ORDER — AZITHROMYCIN 250 MG PO TABS: ORAL_TABLET | ORAL | 0 refills | Status: DC

## 2021-05-16 NOTE — ED Triage Notes (Signed)
Pt in with co cough, congestion and body aches for 3 weeks. Has been dx with bronchitis but was not put on antibiotics. Pt is [redacted] weeks pregnant with no pregnancy concerns.

## 2021-05-16 NOTE — Discharge Instructions (Addendum)
Take the meds as directed. Consider mixing equal parts of Maalox/Mylanta with children's Benadryl, and gargle 3-4 times daily. Take OTC Delsym for cough. Follow-up with your provider for continued symptoms.

## 2021-05-17 ENCOUNTER — Telehealth: Payer: Self-pay

## 2021-05-17 NOTE — Telephone Encounter (Signed)
Transition Care Management Unsuccessful Follow-up Telephone Call  Date of discharge and from where:  05/16/2021 from [facility]  Attempts:  1st Attempt  Reason for unsuccessful TCM follow-up call:  Voice mail full

## 2021-05-17 NOTE — ED Provider Notes (Signed)
[facility] Emergency Department Provider Note ____________________________________________  Time seen: 2128  I have reviewed the triage vital signs and the nursing notes.  HISTORY  Chief Complaint  Cough and Nasal Congestion   HPI Brooke Aguirre is a 25 y.o. female presents to the ED with complaints of cough, congestion, body for the last 3 weeks.  Patient reports intermittent symptoms after she was diagnosed with bronchitis several weeks earlier.  She reports she 13-week pregnant, and only been told that she can take Benadryl and Tylenol for her symptoms.  She denies any fevers, chills, sweats, chest pain, or shortness of breath.  Patient also denies any complaints related to the pregnancy.  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    Hypothyroidism    Kidney stone    Thyroid goiter     Patient Active Problem List   Diagnosis Date Noted   Contact with or exposure to varicella 05/07/2021   Supervision of high-risk pregnancy 04/12/2021   Obesity affecting pregnancy 04/12/2021   Left nephrolithiasis 03/30/2019   Nephrolithiasis 03/29/2019   Status post LEEP (loop electrosurgical excision procedure) of cervix 05/03/2018   Dysplasia of cervix, 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   Tobacco user 02/25/2018   Heart murmur 05/03/2012   URI, acute 05/03/2012   Pyelonephritis 12/02/2011    Past Surgical History:  Procedure Laterality Date   EXTRACORPOREAL SHOCK WAVE LITHOTRIPSY Left 04/14/2019   Procedure: EXTRACORPOREAL SHOCK WAVE LITHOTRIPSY (ESWL);  Surgeon: Riki AltesStoioff, Scott C, MD;  Location: [facility] ORS;  Service: Urology;  Laterality: Left;   EXTRACORPOREAL SHOCK WAVE LITHOTRIPSY Right 05/05/2019   Procedure:  EXTRACORPOREAL SHOCK WAVE LITHOTRIPSY (ESWL);  Surgeon: Riki AltesStoioff, Scott C, MD;  Location: [facility] ORS;  Service: Urology;  Laterality: Right;   LEEP N/A 05/03/2018   Procedure: LOOP ELECTROSURGICAL EXCISION PROCEDURE (LEEP);  Surgeon: Herold Harmsefrancesco, Martin A, MD;  Location: [facility] ORS;  Service: Gynecology;  Laterality: N/A;   REMOVAL OF DRUG DELIVERY IMPLANT Left 12/15/2014   Procedure: REMOVAL OF DRUG DELIVERY IMPLANT;  Surgeon: Nadara Mustardobert P Harris, MD;  Location: [facility] ORS;  Service: Gynecology;  Laterality: Left;    Prior to Admission medications   Medication Sig Start Date End Date Taking? Authorizing Provider  azithromycin (ZITHROMAX Z-PAK) 250 MG tablet Take 2 tablets (500 mg) on  Day 1,  followed by 1 tablet (250 mg) once daily on Days 2 through 5. 05/16/21  Yes Sherl Yzaguirre, Charlesetta IvoryJenise V Bacon, PA-C  ondansetron (ZOFRAN ODT) 4 MG disintegrating tablet Take 1 tablet (4 mg total) by mouth every 8 (eight) hours as needed. 05/16/21  Yes Cranston Koors, Charlesetta IvoryJenise V Bacon, PA-C  budesonide (RHINOCORT AQUA) 32 MCG/ACT nasal spray Place into the nose. 04/17/21 04/17/22  [provider]  Prenatal Vit-Fe Fumarate-FA (MULTIVITAMIN-PRENATAL) 27-0.8 MG TABS tablet Take 1 tablet by mouth daily at 12 noon.    [provider]  promethazine (PHENERGAN) 25 MG tablet Take 1 tablet (25 mg total) by mouth every 8 (eight) hours as needed for nausea or vomiting. 10/18/20 11/13/20  Couture, Cortni S, PA-C    Allergies Kiwi extract, Amoxicillin, Ciprofloxacin, Penicillins, Tramadol, Cranberry, Latex, and Sulfa antibiotics  Family History  Problem Relation Age of Onset  Hepatitis C Mother    Hepatitis C Father    Breast cancer Neg Hx    Ovarian cancer Neg Hx    Colon cancer Neg Hx     Social History Social History   Tobacco Use   Smoking status: Every Day    Packs/day: 1.00    Years: 5.00    Pack years: 5.00    Types: Cigarettes   Smokeless tobacco: Never  Vaping Use   Vaping Use: Never used  Substance Use Topics    Alcohol use: Yes    Comment: occasional    Drug use: No    Review of Systems  Constitutional: Negative for fever. Eyes: Negative for visual changes. ENT: Negative for sore throat. Cardiovascular: Negative for chest pain. Respiratory: Negative for shortness of breath.  Reports cough as above Gastrointestinal: Negative for abdominal pain, vomiting and diarrhea. Genitourinary: Negative for dysuria. Musculoskeletal: Negative for back pain.  Reports generalized body aches Skin: Negative for rash. Neurological: Negative for headaches, focal weakness or numbness. ____________________________________________  PHYSICAL EXAM:  VITAL SIGNS: ED Triage Vitals [05/16/21 1914]  Enc Vitals Group     BP 114/79     Pulse Rate 99     Resp 20     Temp 97.9 F (36.6 C)     Temp Source Oral     SpO2 100 %     Weight 210 lb (95.3 kg)     Height 4\' 11"  (1.499 m)     Head Circumference      Peak Flow      Pain Score 8     Pain Loc      Pain Edu?      Excl. in GC?     Constitutional: Alert and oriented. Well appearing and in no distress. Head: Normocephalic and atraumatic. Eyes: Conjunctivae are normal. PERRL. Normal extraocular movements Ears: Canals clear. TMs intact bilaterally. Nose: No congestion/rhinorrhea/epistaxis. Mouth/Throat: Mucous membranes are moist. Neck: Supple. No thyromegaly. Hematological/Lymphatic/Immunological: No cervical lymphadenopathy. Cardiovascular: Normal rate, regular rhythm. Normal distal pulses. Respiratory: Normal respiratory effort. No wheezes/rales/rhonchi. Gastrointestinal: Soft and nontender. No distention. Musculoskeletal: Nontender with normal range of motion in all extremities.  Neurologic:  Normal gait without ataxia. Normal speech and language. No gross focal neurologic deficits are appreciated. Skin:  Skin is warm, dry and intact. No rash noted. Psychiatric: Mood and affect are normal. Patient exhibits appropriate insight and  judgment. ____________________________________________    {LABS (pertinent positives/negatives) Labs Reviewed  RESP PANEL BY RT-PCR (FLU A&B, COVID) ARPGX2  ____________________________________________  {EKG  ____________________________________________   RADIOLOGY Official radiology report(s): No results found. ____________________________________________  PROCEDURES   Procedures ____________________________________________   INITIAL IMPRESSION / ASSESSMENT AND PLAN / ED COURSE  As part of my medical decision making, I reviewed the following data within the electronic MEDICAL RECORD NUMBER Labs reviewed WNL and Notes from prior ED visits  DDX: RSV, Covid, influenza    Pregnant patient at 13 weeks, presents to the ED for evaluation of intermittent cough and congestion.  Patient has a normal exam with reassuring vital signs.  She has a negative viral panel screen at this time, and no signs of acute respiratory distress.  Given her persistent symptoms we will treat empirically with course of azithromycin.  Patient will follow with primary provider or OB provider for persistent symptoms.   Brooke Aguirre was evaluated in Emergency Department on 05/17/2021 for the symptoms described in the history of present illness. She was evaluated in the context of the  global COVID-19 pandemic, which necessitated consideration that the patient might be at risk for infection with the SARS-CoV-2 virus that causes COVID-19. Institutional protocols and algorithms that pertain to the evaluation of patients at risk for COVID-19 are in a state of rapid change based on information released by regulatory bodies including the CDC and federal and state organizations. These policies and algorithms were followed during the patient's care in the ED. ____________________________________________  FINAL CLINICAL IMPRESSION(S) / ED DIAGNOSES  Final diagnoses:  Viral URI with cough  Bronchitis      Mellody Masri,  Dannielle Karvonen, PA-C 05/17/21 2314    Nena Polio, MD 05/17/21 2315

## 2021-05-20 ENCOUNTER — Other Ambulatory Visit: Payer: Medicaid Other

## 2021-05-20 ENCOUNTER — Other Ambulatory Visit: Payer: Self-pay

## 2021-05-20 DIAGNOSIS — O99211 Obesity complicating pregnancy, first trimester: Secondary | ICD-10-CM

## 2021-05-20 DIAGNOSIS — O0991 Supervision of high risk pregnancy, unspecified, first trimester: Secondary | ICD-10-CM

## 2021-05-20 DIAGNOSIS — O99810 Abnormal glucose complicating pregnancy: Secondary | ICD-10-CM | POA: Diagnosis not present

## 2021-05-20 NOTE — Telephone Encounter (Signed)
Transition Care Management Unsuccessful Follow-up Telephone Call  Date of discharge and from where:  05/16/2021 from [facility]  Attempts:  2nd Attempt  Reason for unsuccessful TCM follow-up call:  Voice mail full

## 2021-05-21 LAB — GESTATIONAL GLUCOSE TOLERANCE
Glucose, Fasting: 93 mg/dL (ref 70–94)
Glucose, GTT - 1 Hour: 202 mg/dL — ABNORMAL HIGH (ref 70–179)
Glucose, GTT - 2 Hour: 193 mg/dL — ABNORMAL HIGH (ref 70–154)
Glucose, GTT - 3 Hour: 75 mg/dL (ref 70–139)

## 2021-05-21 NOTE — Telephone Encounter (Signed)
Transition Care Management Unsuccessful Follow-up Telephone Call  Date of discharge and from where:  05/16/2021 from [facility]  Attempts:  3rd Attempt  Reason for unsuccessful TCM follow-up call:  Unable to reach patient

## 2021-05-22 ENCOUNTER — Other Ambulatory Visit: Payer: Self-pay | Admitting: Obstetrics and Gynecology

## 2021-05-22 DIAGNOSIS — O24419 Gestational diabetes mellitus in pregnancy, unspecified control: Secondary | ICD-10-CM | POA: Insufficient documentation

## 2021-05-22 MED ORDER — ACCU-CHEK GUIDE W/DEVICE KIT: 1.0000 | PACK | 0 refills | Status: DC

## 2021-05-22 MED ORDER — ACCU-CHEK SOFTCLIX LANCETS MISC: 1.0000 | Freq: Four times a day (QID) | 3 refills | Status: DC

## 2021-05-22 MED ORDER — ACCU-CHEK GUIDE VI STRP: ORAL_STRIP | 12 refills | Status: DC

## 2021-05-23 ENCOUNTER — Encounter: Payer: Medicaid Other | Admitting: Obstetrics

## 2021-05-23 ENCOUNTER — Other Ambulatory Visit: Payer: Medicaid Other

## 2021-05-27 ENCOUNTER — Encounter: Payer: Medicaid Other | Admitting: Obstetrics and Gynecology

## 2021-05-27 ENCOUNTER — Other Ambulatory Visit: Payer: Self-pay

## 2021-05-27 ENCOUNTER — Encounter: Payer: Medicaid Other | Admitting: Obstetrics

## 2021-05-27 ENCOUNTER — Ambulatory Visit (INDEPENDENT_AMBULATORY_CARE_PROVIDER_SITE_OTHER): Payer: Medicaid Other | Admitting: Obstetrics and Gynecology

## 2021-05-27 VITALS — BP 122/78 | Wt 210.0 lb

## 2021-05-27 DIAGNOSIS — O0991 Supervision of high risk pregnancy, unspecified, first trimester: Secondary | ICD-10-CM

## 2021-05-27 DIAGNOSIS — Z3A14 14 weeks gestation of pregnancy: Secondary | ICD-10-CM

## 2021-05-27 DIAGNOSIS — O24419 Gestational diabetes mellitus in pregnancy, unspecified control: Secondary | ICD-10-CM

## 2021-05-27 DIAGNOSIS — O99212 Obesity complicating pregnancy, second trimester: Secondary | ICD-10-CM

## 2021-05-27 LAB — POCT URINALYSIS DIPSTICK OB
Glucose, UA: NEGATIVE
POC,PROTEIN,UA: NEGATIVE

## 2021-05-27 NOTE — Progress Notes (Signed)
Routine Prenatal Care Visit  Subjective  Brooke Aguirre is a 25 y.o. G3P2002 at 705w2d being seen today for ongoing prenatal care.  She is currently monitored for the following issues for this high-risk pregnancy and has Dysplasia of cervix, high grade CIN 2; Tobacco user; Status post LEEP (loop electrosurgical excision procedure) of cervix; Nephrolithiasis; Left nephrolithiasis; Heart murmur; Pyelonephritis; URI, acute; Supervision of high-risk pregnancy; Obesity affecting pregnancy; Contact with or exposure to varicella; and Gestational diabetes mellitus (GDM), antepartum on their problem list.  ----------------------------------------------------------------------------------- Patient reports no complaints.   Contractions: Not present. Vag. Bleeding: None.  Movement: Absent. Denies leaking of fluid.  ----------------------------------------------------------------------------------- The following portions of the patient's history were reviewed and updated as appropriate: allergies, current medications, past family history, past medical history, past social history, past surgical history and problem list. Problem list updated.   Objective  Blood pressure 122/78, weight 210 lb (95.3 kg), last menstrual period 02/16/2021. Pregravid weight 220 lb (99.8 kg) Total Weight Gain -10 lb (-4.536 kg) Urinalysis:      Fetal Status: Fetal Heart Rate (bpm): 160   Movement: Absent     General:  Alert, oriented and cooperative. Patient is in no acute distress.  Skin: Skin is warm and dry. No rash noted.   Cardiovascular: Normal heart rate noted  Respiratory: Normal respiratory effort, no problems with respiration noted  Abdomen: Soft, gravid, appropriate for gestational age. Pain/Pressure: Absent     Pelvic:  Cervical exam deferred        Extremities: Normal range of motion.     ental Status: Normal mood and affect. Normal behavior. Normal judgment and thought content.     Assessment   25  y.o. [id number] at 595w2d by  11/23/2021, by Last Menstrual Period presenting for routine prenatal visit  Plan   pregnancy 3 Problems (from 04/12/21 to present)     Problem Noted Resolved   Supervision of high-risk pregnancy 04/12/2021 by Tresea MallGledhill, Jane, CNM No   Overview Addendum 05/16/2021  9:51 PM by Vena AustriaStaebler, Dagny Fiorentino, MD     Nursing Staff Provider  Office Location  [facility] Dating  LMP = 9 week US  Language  English Anatomy US    Flu Vaccine   Genetic Screen  NIPS: Normal XY  TDaP vaccine    Hgb A1C or  GTT Early : Third trimester :   Covid    LAB RESULTS   Rhogam   Blood Type --/--/A POS Performed at [facility], [address] [place], [place] [address]  [phone](09/13 2114)   Feeding Plan  Antibody    Contraception  Rubella    Circumcision  RPR     Pediatrician   HBsAg     Support Person  HIV    Prenatal Classes  Varicella     GBS  (For PCN allergy, check sensitivities)   BTL Consent     VBAC Consent  Pap  2021 negative    Hgb Electro    Pelvis Tested  CF Negative     SMA Negative    Fragile-X Negative  Hx LEEP in 2019 Hx IOL x2 IUGR 2014/2017      Obesity affecting pregnancy 04/12/2021 by Tresea MallGledhill, Jane, CNM No        Gestational age appropriate obstetric precautions including but not limited to vaginal bleeding, contractions, leaking of fluid and fetal movement were reviewed in detail with the patient.    BG highest reading 137 most well within goal.  Has lifestyle appointment  11/18  Return in about 2 weeks (around 06/10/2021) for 2 week ROB, 4 weeks ROB.  Vena Austria, MD, [facility], [facility] 05/27/2021, 10:52 AM

## 2021-05-27 NOTE — Progress Notes (Signed)
ROB - no concerns. RM 5 

## 2021-05-29 ENCOUNTER — Other Ambulatory Visit: Payer: Self-pay

## 2021-05-29 DIAGNOSIS — K5641 Fecal impaction: Secondary | ICD-10-CM | POA: Insufficient documentation

## 2021-05-29 DIAGNOSIS — O99612 Diseases of the digestive system complicating pregnancy, second trimester: Secondary | ICD-10-CM | POA: Insufficient documentation

## 2021-05-29 DIAGNOSIS — K5900 Constipation, unspecified: Secondary | ICD-10-CM | POA: Diagnosis not present

## 2021-05-29 DIAGNOSIS — Z79899 Other long term (current) drug therapy: Secondary | ICD-10-CM | POA: Insufficient documentation

## 2021-05-29 DIAGNOSIS — E049 Nontoxic goiter, unspecified: Secondary | ICD-10-CM | POA: Diagnosis not present

## 2021-05-29 DIAGNOSIS — R110 Nausea: Secondary | ICD-10-CM | POA: Diagnosis not present

## 2021-05-29 DIAGNOSIS — E039 Hypothyroidism, unspecified: Secondary | ICD-10-CM | POA: Insufficient documentation

## 2021-05-29 DIAGNOSIS — F17210 Nicotine dependence, cigarettes, uncomplicated: Secondary | ICD-10-CM | POA: Diagnosis not present

## 2021-05-29 DIAGNOSIS — N189 Chronic kidney disease, unspecified: Secondary | ICD-10-CM | POA: Diagnosis not present

## 2021-05-29 DIAGNOSIS — O26892 Other specified pregnancy related conditions, second trimester: Secondary | ICD-10-CM | POA: Diagnosis not present

## 2021-05-29 DIAGNOSIS — O210 Mild hyperemesis gravidarum: Secondary | ICD-10-CM | POA: Insufficient documentation

## 2021-05-29 DIAGNOSIS — O99611 Diseases of the digestive system complicating pregnancy, first trimester: Secondary | ICD-10-CM | POA: Diagnosis not present

## 2021-05-29 DIAGNOSIS — Z3A14 14 weeks gestation of pregnancy: Secondary | ICD-10-CM | POA: Diagnosis not present

## 2021-05-29 DIAGNOSIS — Z5321 Procedure and treatment not carried out due to patient leaving prior to being seen by health care provider: Secondary | ICD-10-CM | POA: Insufficient documentation

## 2021-05-29 NOTE — ED Triage Notes (Signed)
Pt reports that she is constipated. Last BM was a week and 3 days ago. Pt is also [redacted]wks pregnant and was instructed by her OB to come in and be seen. Pt reports lower abdominal pain and rectum pain. Pt has been taking zofran for nausea.

## 2021-05-30 ENCOUNTER — Ambulatory Visit (INDEPENDENT_AMBULATORY_CARE_PROVIDER_SITE_OTHER): Payer: Medicaid Other | Admitting: Obstetrics and Gynecology

## 2021-05-30 ENCOUNTER — Emergency Department
Admission: EM | Admit: 2021-05-30 | Discharge: 2021-05-30 | Disposition: A | Discharge status: Home / Self Care | Payer: Medicaid Other | Attending: Emergency Medicine | Admitting: Emergency Medicine

## 2021-05-30 ENCOUNTER — Emergency Department
Admission: EM | Admit: 2021-05-30 | Discharge: 2021-05-30 | Disposition: A | Discharge status: Home / Self Care | Payer: Medicaid Other | Source: Home / Self Care | Attending: Emergency Medicine | Admitting: Emergency Medicine

## 2021-05-30 ENCOUNTER — Encounter: Payer: Medicaid Other | Admitting: Obstetrics and Gynecology

## 2021-05-30 ENCOUNTER — Other Ambulatory Visit: Payer: Self-pay

## 2021-05-30 ENCOUNTER — Encounter: Payer: Self-pay | Admitting: Obstetrics and Gynecology

## 2021-05-30 VITALS — BP 122/81 | Wt 208.0 lb

## 2021-05-30 DIAGNOSIS — O99612 Diseases of the digestive system complicating pregnancy, second trimester: Secondary | ICD-10-CM | POA: Insufficient documentation

## 2021-05-30 DIAGNOSIS — K5641 Fecal impaction: Secondary | ICD-10-CM

## 2021-05-30 DIAGNOSIS — K5900 Constipation, unspecified: Secondary | ICD-10-CM | POA: Diagnosis not present

## 2021-05-30 DIAGNOSIS — N189 Chronic kidney disease, unspecified: Secondary | ICD-10-CM | POA: Insufficient documentation

## 2021-05-30 DIAGNOSIS — F17210 Nicotine dependence, cigarettes, uncomplicated: Secondary | ICD-10-CM | POA: Insufficient documentation

## 2021-05-30 DIAGNOSIS — E049 Nontoxic goiter, unspecified: Secondary | ICD-10-CM | POA: Insufficient documentation

## 2021-05-30 DIAGNOSIS — Z3A14 14 weeks gestation of pregnancy: Secondary | ICD-10-CM | POA: Insufficient documentation

## 2021-05-30 DIAGNOSIS — E039 Hypothyroidism, unspecified: Secondary | ICD-10-CM | POA: Insufficient documentation

## 2021-05-30 DIAGNOSIS — Z91040 Latex allergy status: Secondary | ICD-10-CM | POA: Insufficient documentation

## 2021-05-30 DIAGNOSIS — O26892 Other specified pregnancy related conditions, second trimester: Secondary | ICD-10-CM | POA: Insufficient documentation

## 2021-05-30 DIAGNOSIS — Z79899 Other long term (current) drug therapy: Secondary | ICD-10-CM | POA: Insufficient documentation

## 2021-05-30 DIAGNOSIS — O210 Mild hyperemesis gravidarum: Secondary | ICD-10-CM | POA: Insufficient documentation

## 2021-05-30 DIAGNOSIS — O99611 Diseases of the digestive system complicating pregnancy, first trimester: Secondary | ICD-10-CM | POA: Diagnosis not present

## 2021-05-30 MED ORDER — POLYETHYLENE GLYCOL 3350 17 GM/SCOOP PO POWD: 9.0000 g | Freq: Every day | ORAL | 0 refills | Status: DC

## 2021-05-30 NOTE — Discharge Instructions (Signed)
Please use MiraLAX one half capful every hour until your first bowel movement and then stop for 24 hours.  After this you may use as much MiraLAX as is necessary to have 1 well-formed bowel movement per day.

## 2021-05-30 NOTE — ED Notes (Signed)
No answer when called several times from lobby 

## 2021-05-30 NOTE — ED Provider Notes (Signed)
[facility] Emergency Department Provider Note   ____________________________________________   Event Date/Time   First MD Initiated Contact with Patient 05/30/21 1326     (approximate)  I have reviewed the triage vital signs and the nursing notes.   HISTORY  Chief Complaint Constipation    HPI Brooke Aguirre is a 25 y.o. female with stated past medical history of 14-week gestation who presents for constipation from her OB/GYN's office with diagnosis of fecal impaction requesting disimpaction  LOCATION: Rectum DURATION: 2 weeks prior to arrival TIMING: Worsening since onset SEVERITY: Severe QUALITY: Constipation CONTEXT: presents for constipation from her OB/GYN's office with diagnosis of fecal impaction requesting disimpaction MODIFYING FACTORS: Denies any exacerbating or relieving factors ASSOCIATED SYMPTOMS: Abdominal pain, vomiting   Per medical record review, patient has history of 14-week gestation          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    Hypothyroidism    Kidney stone    Thyroid goiter     Patient Active Problem List   Diagnosis Date Noted   Gestational diabetes mellitus (GDM), antepartum 05/22/2021   Contact with or exposure to varicella 05/07/2021   Supervision of high-risk pregnancy 04/12/2021   Obesity affecting pregnancy 04/12/2021   Left nephrolithiasis 03/30/2019   Nephrolithiasis 03/29/2019   Status post LEEP (loop electrosurgical excision procedure) of cervix 05/03/2018   Dysplasia of cervix, 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   Tobacco user 02/25/2018   Heart murmur 05/03/2012   URI, acute 05/03/2012   Pyelonephritis 12/02/2011    Past Surgical History:   Procedure Laterality Date   EXTRACORPOREAL SHOCK WAVE LITHOTRIPSY Left 04/14/2019   Procedure: EXTRACORPOREAL SHOCK WAVE LITHOTRIPSY (ESWL);  Surgeon: Riki Altes, MD;  Location: [facility] ORS;  Service: Urology;  Laterality: Left;   EXTRACORPOREAL SHOCK WAVE LITHOTRIPSY Right 05/05/2019   Procedure: EXTRACORPOREAL SHOCK WAVE LITHOTRIPSY (ESWL);  Surgeon: Riki Altes, MD;  Location: [facility] ORS;  Service: Urology;  Laterality: Right;   LEEP N/A 05/03/2018   Procedure: LOOP ELECTROSURGICAL EXCISION PROCEDURE (LEEP);  Surgeon: Herold Harms, MD;  Location: [facility] ORS;  Service: Gynecology;  Laterality: N/A;   REMOVAL OF DRUG DELIVERY IMPLANT Left 12/15/2014   Procedure: REMOVAL OF DRUG DELIVERY IMPLANT;  Surgeon: Nadara Mustard, MD;  Location: [facility] ORS;  Service: Gynecology;  Laterality: Left;    Prior to Admission medications   Medication Sig Start Date End Date Taking? Authorizing Provider  polyethylene glycol powder (MIRALAX) 17 GM/SCOOP powder Take 9 g by mouth daily. 05/30/21  Yes Merwyn Katos, MD  Accu-Chek Softclix Lancets lancets 1 each by Other route 4 (four) times daily. Use as instructed 05/22/21   Vena Austria, MD  azithromycin (ZITHROMAX Z-PAK) 250 MG tablet Take 2 tablets (500 mg) on  Day 1,  followed by 1 tablet (250 mg) once daily on Days 2 through 5. 05/16/21   Menshew, Charlesetta Ivory, PA-C  Blood Glucose Monitoring Suppl (ACCU-CHEK GUIDE) w/Device KIT 1 kit by Does not apply route as directed. 05/22/21   Vena Austria, MD  budesonide (RHINOCORT AQUA) 32 MCG/ACT nasal spray Place into the nose. 04/17/21 04/17/22  [provider]  glucose  blood (ACCU-CHEK GUIDE) test strip Use as instructed 05/22/21   Malachy Mood, MD  ondansetron (ZOFRAN ODT) 4 MG disintegrating tablet Take 1 tablet (4 mg total) by mouth every 8 (eight) hours as needed. 05/16/21   Menshew, Dannielle Karvonen, PA-C  Prenatal Vit-Fe Fumarate-FA (MULTIVITAMIN-PRENATAL) 27-0.8 MG TABS tablet Take  1 tablet by mouth daily at 12 noon.    [provider]  promethazine (PHENERGAN) 25 MG tablet Take 1 tablet (25 mg total) by mouth every 8 (eight) hours as needed for nausea or vomiting. 10/18/20 11/13/20  Couture, Cortni S, PA-C    Allergies Kiwi extract, Amoxicillin, Ciprofloxacin, Penicillins, Tramadol, Cranberry, Latex, and Sulfa antibiotics  Family History  Problem Relation Age of Onset   Hepatitis C Mother    Hepatitis C Father    Breast cancer Neg Hx    Ovarian cancer Neg Hx    Colon cancer Neg Hx     Social History Social History   Tobacco Use   Smoking status: Every Day    Packs/day: 1.00    Years: 5.00    Pack years: 5.00    Types: Cigarettes   Smokeless tobacco: Never  Vaping Use   Vaping Use: Never used  Substance Use Topics   Alcohol use: Yes    Comment: occasional    Drug use: No    Review of Systems Constitutional: No fever/chills Eyes: No visual changes. ENT: No sore throat. Cardiovascular: Denies chest pain. Respiratory: Denies shortness of breath. Gastrointestinal: No abdominal pain.  No nausea, no vomiting.  No diarrhea.  Positive for constipation Genitourinary: Negative for dysuria. Musculoskeletal: Negative for acute arthralgias Skin: Negative for rash. Neurological: Negative for headaches, weakness/numbness/paresthesias in any extremity Psychiatric: Negative for suicidal ideation/homicidal ideation   ____________________________________________   PHYSICAL EXAM:  VITAL SIGNS: ED Triage Vitals  Enc Vitals Group     BP 05/30/21 1137 114/74     Pulse Rate 05/30/21 1137 (!) 107     Resp 05/30/21 1137 18     Temp 05/30/21 1137 98.1 F (36.7 C)     Temp Source 05/30/21 1137 Oral     SpO2 05/30/21 1137 98 %     Weight 05/30/21 1138 208 lb (94.3 kg)     Height 05/30/21 1138 $RemoveBefor'4\' 11"'[id number]$  (1.499 m)     Head Circumference --      Peak Flow --      Pain Score 05/30/21 1138 9     Pain Loc --      Pain Edu? --      Excl. in [place]? --     Constitutional: Alert and oriented. Well appearing and in no acute distress. Eyes: Conjunctivae are normal. PERRL. Head: Atraumatic. Nose: No congestion/rhinnorhea. Mouth/Throat: Mucous membranes are moist. Neck: No stridor Cardiovascular: Grossly normal heart sounds.  Good peripheral circulation. Respiratory: Normal respiratory effort.  No retractions. Gastrointestinal: Soft and nontender. No distention. Musculoskeletal: No obvious deformities Neurologic:  Normal speech and language. No gross focal neurologic deficits are appreciated. Skin:  Skin is warm and dry. No rash noted. Psychiatric: Mood and affect are normal. Speech and behavior are normal.  ____________________________________________   LABS (all labs ordered are listed, but only abnormal results are displayed)  Labs Reviewed - No data to display ____________________________________________ PROCEDURES  Procedure(s) performed (including Critical Care):  Fecal disimpaction  Date/Time: 05/30/2021 2:46 PM Performed by: Naaman Plummer, MD Authorized by: Naaman Plummer, MD  Consent: Verbal consent not obtained. Risks and benefits: risks, benefits and alternatives were  discussed Consent given by: patient Patient understanding: patient states understanding of the procedure being performed Patient identity confirmed: verbally with patient and arm band Time out: Immediately prior to procedure a "time out" was called to verify the correct patient, procedure, equipment, support staff and site/side marked as required. Preparation: Patient was prepped and draped in the usual sterile fashion. Local anesthesia used: no  Anesthesia: Local anesthesia used: no  Sedation: Patient sedated: no  Patient tolerance: patient tolerated the procedure well with no immediate complications     ____________________________________________   INITIAL IMPRESSION / ASSESSMENT AND PLAN / ED COURSE  As part of my medical decision  making, I reviewed the following data within the electronic medical record, if available:  Nursing notes reviewed and incorporated, Labs reviewed, EKG interpreted, Old chart reviewed, Radiograph reviewed and Notes from prior ED visits reviewed and incorporated        Patients history and exam most consistent with constipation as an etiology for their pain.  Patients symptoms not typical for other emergent causes of abdominal pain such as, but not limited to, appendicitis, abdominal aortic aneurysm, pancreatitis, SBO, mesenteric ischemia, serious intra-abdominal bacterial illness.  Patient without red flags concerning for cancer as a constipation etiology. Disimpaction performed without complication Rx: Miralax  Disposition:  Patient will be discharged with strict return precautions and follow up with primary MD within 24-48 hours for further evaluation. Patient understands that this still may have an early presentation of an emergent medical condition such as appendicitis that will require a recheck.      ____________________________________________   FINAL CLINICAL IMPRESSION(S) / ED DIAGNOSES  Final diagnoses:  Constipation, unspecified constipation type  Fecal impaction in rectum [facility])     ED Discharge Orders          Ordered    polyethylene glycol powder (MIRALAX) 17 GM/SCOOP powder  Daily        05/30/21 1437             Note:  This document was prepared using Dragon voice recognition software and may include unintentional dictation errors.    Naaman Plummer, MD 05/30/21 [phone]

## 2021-05-30 NOTE — Progress Notes (Signed)
Obstetrics & Gynecology Office Visit   Chief Complaint:  Chief Complaint  Patient presents with   Constipation    Constipation x 2wk. RM 3    History of Present Illness: 25 y.o. [id number] at 23w5dpresenting with 2 weeks of constipation.  Was seen 3 days ago for ROB and did not raise this issue. Symptoms have progressed to a point where she has also noted nausea and vomiting now.  She is still taking Zofran prn.  To date she has tried stool softners and prune juice.  She has not taken Miralax or tried home enema.     Review of Systems: Review of Systems  Constitutional: Negative.   Gastrointestinal:  Positive for abdominal pain, constipation, nausea and vomiting. Negative for blood in stool, diarrhea, heartburn and melena.  Genitourinary: Negative.     Past Medical History:  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    Hypothyroidism    Kidney stone    Thyroid goiter     Past Surgical History:  Past Surgical History:  Procedure Laterality Date   EXTRACORPOREAL SHOCK WAVE LITHOTRIPSY Left 04/14/2019   Procedure: EXTRACORPOREAL SHOCK WAVE LITHOTRIPSY (ESWL);  Surgeon: SAbbie Sons MD;  Location: [facility];  Service: Urology;  Laterality: Left;   EXTRACORPOREAL SHOCK WAVE LITHOTRIPSY Right 05/05/2019   Procedure: EXTRACORPOREAL SHOCK WAVE LITHOTRIPSY (ESWL);  Surgeon: SAbbie Sons MD;  Location: [facility] OR[facility];  Service: Urology;  Laterality: Right;   LEEP N/A 05/03/2018   Procedure: LOOP ELECTROSURGICAL EXCISION PROCEDURE (LEEP);  Surgeon: DBrayton Mars MD;  Location: [facility];  Service: Gynecology;  Laterality: N/A;   REMOVAL OF DRUG DELIVERY IMPLANT Left 12/15/2014   Procedure: REMOVAL OF DRUG DELIVERY IMPLANT;  Surgeon:  RGae Dry MD;  Location: [facility];  Service: Gynecology;  Laterality: Left;    Gynecologic History: Patient's last menstrual period was 02/16/2021 (exact date).  Obstetric History: [id number] Family History:  Family History  Problem Relation Age of Onset   Hepatitis C Mother    Hepatitis C Father    Breast cancer Neg Hx    Ovarian cancer Neg Hx    Colon cancer Neg Hx     Social History:  Social History   Socioeconomic History   Marital status: Married    Spouse name: NFerd Hibbs  Number of children: Not on file   Years of education: Not on file   Highest education level: Not on file  Occupational History   Not on file  Tobacco Use   Smoking status: Every Day    Packs/day: 1.00    Years: 5.00    Pack years: 5.00    Types: Cigarettes   Smokeless tobacco: Never  Vaping Use   Vaping Use: Never used  Substance and Sexual Activity   Alcohol use: Yes    Comment: occasional    Drug use: No   Sexual activity: Yes    Birth control/protection: I.U.D.    Comment: Mirena removed June or July 2022  Other Topics Concern   Not on file  Social History Narrative   Not on file   Social Determinants of Health   Financial Resource Strain:  Not on file  Food Insecurity: Not on file  Transportation Needs: Not on file  Physical Activity: Not on file  Stress: Not on file  Social Connections: Not on file  Intimate Partner Violence: Not At Risk   Fear of Current or Ex-Partner: No   Emotionally Abused: No   Physically Abused: No   Sexually Abused: No    Allergies:  Allergies  Allergen Reactions   Kiwi Extract Anaphylaxis   Amoxicillin Hives, Rash and Other (See Comments)    Blisters Has patient had a PCN reaction causing immediate rash, facial/tongue/throat swelling, SOB or lightheadedness with hypotension: No Has patient had a PCN reaction causing severe rash involving mucus membranes or skin necrosis: Yes Has patient had a PCN reaction that required  hospitalization: No Has patient had a PCN reaction occurring within the last 10 years: Unknown If all of the above answers are "NO", then may proceed with Cephalosporin use.    Ciprofloxacin Hives    blisters   Penicillins Rash and Other (See Comments)    Has patient had a PCN reaction causing immediate rash, facial/tongue/throat swelling, SOB or lightheadedness with hypotension:no Has patient had a PCN reaction causing severe rash involving mucus membranes or skin necrosis: yes Has patient had a PCN reaction that required hospitalization no Has patient had a PCN reaction occurring within the last 10 years: yes If all of the above answers are "NO", then may proceed with Cephalosporin use.    Tramadol Itching and Hives   Cranberry Rash   Latex Rash and Hives   Sulfa Antibiotics Rash and Other (See Comments)    BLISTERS    Medications: Prior to Admission medications   Medication Sig Start Date End Date Taking? Authorizing Provider  Accu-Chek Softclix Lancets lancets 1 each by Other route 4 (four) times daily. Use as instructed 05/22/21   Staebler, Andreas, MD  azithromycin (ZITHROMAX Z-PAK) 250 MG tablet Take 2 tablets (500 mg) on  Day 1,  followed by 1 tablet (250 mg) once daily on Days 2 through 5. 05/16/21   Menshew, Jenise V Bacon, PA-C  Blood Glucose Monitoring Suppl (ACCU-CHEK GUIDE) w/Device KIT 1 kit by Does not apply route as directed. 05/22/21   Staebler, Andreas, MD  budesonide (RHINOCORT AQUA) 32 MCG/ACT nasal spray Place into the nose. 04/17/21 04/17/22  [provider]  glucose blood (ACCU-CHEK GUIDE) test strip Use as instructed 05/22/21   Staebler, Andreas, MD  ondansetron (ZOFRAN ODT) 4 MG disintegrating tablet Take 1 tablet (4 mg total) by mouth every 8 (eight) hours as needed. 05/16/21   Menshew, Jenise V Bacon, PA-C  Prenatal Vit-Fe Fumarate-FA (MULTIVITAMIN-PRENATAL) 27-0.8 MG TABS tablet Take 1 tablet by mouth daily at 12 noon.    [provider]   promethazine (PHENERGAN) 25 MG tablet Take 1 tablet (25 mg total) by mouth every 8 (eight) hours as needed for nausea or vomiting. 10/18/20 11/13/20  Couture, Cortni S, PA-C    Physical Exam Vitals:  Vitals:   05/30/21 1034  BP: 122/81   Patient's last menstrual period was 02/16/2021 (exact date).  General: NAD HEENT: normocephalic, anicteric Pulmonary: No increased work of breathing Abdomen: NABS, soft, non-tender, non-distended.   Genitourinary:  External: Normal external female genitalia.  Normal urethral meatus, normal  Bartholin's and Skene's glands.    Vagina: Normal vaginal mucosa, no evidence of prolapse.  There is a large rectal fecalith palpated   Cervix: Closed  Rectal: deferred  Lymphatic: no evidence of inguinal lymphadenopathy Extremities: no edema,   erythema, or tenderness Neurologic: Grossly intact Psychiatric: mood appropriate, affect full  Female chaperone present for pelvic  portions of the physical exam  Assessment: 25 y.o. G3P2002 constipation and stool impaction  Plan: Problem List Items Addressed This Visit   None Visit Diagnoses     Impacted stool in rectum ([place])    -  Primary      1) Stool impaction - discussed that given length of constipation and degree of impaction will likely need an enema to disimpact.  We do not have enema supplies in the office.  Discussed home enema vs enema in the hospital.  Patient prefers to return to ER for disimpaction and enema. - discontinue Zofran use  2) Return keep previously scheduled follow up.   Malachy Mood, MD, [facility], [facility] 05/30/2021, 10:53 AM

## 2021-05-30 NOTE — ED Triage Notes (Signed)
Pt here with constipation x2 weeks. Pt is [redacted] weeks pregnant and was sent here by her doctor at [facility] for disimpaction. Pt also having N/V.

## 2021-05-31 ENCOUNTER — Other Ambulatory Visit: Payer: Self-pay

## 2021-05-31 ENCOUNTER — Ambulatory Visit: Payer: Medicaid Other | Admitting: *Deleted

## 2021-05-31 ENCOUNTER — Encounter: Payer: Self-pay | Admitting: Emergency Medicine

## 2021-05-31 ENCOUNTER — Emergency Department
Admission: EM | Admit: 2021-05-31 | Discharge: 2021-05-31 | Disposition: A | Discharge status: Home / Self Care | Payer: Medicaid Other | Source: Home / Self Care | Attending: Emergency Medicine | Admitting: Emergency Medicine

## 2021-05-31 DIAGNOSIS — K5641 Fecal impaction: Secondary | ICD-10-CM

## 2021-05-31 DIAGNOSIS — O99612 Diseases of the digestive system complicating pregnancy, second trimester: Secondary | ICD-10-CM | POA: Diagnosis not present

## 2021-05-31 DIAGNOSIS — K5900 Constipation, unspecified: Secondary | ICD-10-CM | POA: Diagnosis not present

## 2021-05-31 DIAGNOSIS — Z3A14 14 weeks gestation of pregnancy: Secondary | ICD-10-CM | POA: Diagnosis not present

## 2021-05-31 MED ORDER — LACTULOSE 10 GM/15ML PO SOLN
30.0000 g | Freq: Once | ORAL | Status: AC
  Administered 2021-05-31: 30 g via ORAL
  Filled 2021-05-31: qty 60

## 2021-05-31 MED ORDER — DOCUSATE SODIUM 50 MG/5ML PO LIQD
100.0000 mg | Freq: Once | ORAL | Status: AC
  Administered 2021-05-31: 100 mg
  Filled 2021-05-31 (×2): qty 10

## 2021-05-31 MED ORDER — LIDOCAINE VISCOUS HCL 2 % MT SOLN
15.0000 mL | Freq: Once | OROMUCOSAL | Status: AC
  Administered 2021-05-31: 15 mL via OROMUCOSAL
  Filled 2021-05-31: qty 15

## 2021-05-31 MED ORDER — LACTULOSE 10 GM/15ML PO SOLN: 20.0000 g | Freq: Every day | ORAL | 0 refills | Status: DC | PRN

## 2021-05-31 NOTE — ED Provider Notes (Signed)
[facility] Emergency Department Provider Note   ____________________________________________   Event Date/Time   First MD Initiated Contact with Patient 05/31/21 [id number]     (approximate)  I have reviewed the triage vital signs and the nursing notes.   HISTORY  Chief Complaint Fecal Impaction    HPI Brooke Aguirre is a 25 y.o. female who returns to the ED via EMS from home with continued constipation.  Patient is G3 P2 approximately [redacted] weeks pregnant who has had no bowel movement x2 weeks.  History of constipation with her other pregnancies.  She was sent by her OB doctor to the ED yesterday for disimpaction and enema.  She she was disimpacted, did not receive enema and felt better when she was discharged.  Reports feeling like more stool entered her rectal vault and she is complaining of rectal pain.  Reports using MiraLAX, glycerin suppository and fleets enema prior to arrival without relief of symptoms.  She called the on-call OB doctor who directed her back to the ED.  She was vomiting on the initial ED visit but states she has not had vomiting since she left the ED.  Denies fever, cough, chest pain, shortness of breath, vaginal bleeding.     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    Hypothyroidism    Kidney stone    Thyroid goiter     Patient Active Problem List   Diagnosis Date Noted   Gestational diabetes mellitus (GDM), antepartum 05/22/2021   Contact with or exposure to varicella 05/07/2021   Supervision of high-risk pregnancy 04/12/2021   Obesity affecting pregnancy 04/12/2021   Left nephrolithiasis 03/30/2019   Nephrolithiasis 03/29/2019   Status post LEEP (loop electrosurgical excision  procedure) of cervix 05/03/2018   Dysplasia of cervix, 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   Tobacco user 02/25/2018   Heart murmur 05/03/2012   URI, acute 05/03/2012   Pyelonephritis 12/02/2011    Past Surgical History:  Procedure Laterality Date   EXTRACORPOREAL SHOCK WAVE LITHOTRIPSY Left 04/14/2019   Procedure: EXTRACORPOREAL SHOCK WAVE LITHOTRIPSY (ESWL);  Surgeon: Abbie Sons, MD;  Location: [facility] ORS;  Service: Urology;  Laterality: Left;   EXTRACORPOREAL SHOCK WAVE LITHOTRIPSY Right 05/05/2019   Procedure: EXTRACORPOREAL SHOCK WAVE LITHOTRIPSY (ESWL);  Surgeon: Abbie Sons, MD;  Location: [facility] ORS;  Service: Urology;  Laterality: Right;   LEEP N/A 05/03/2018   Procedure: LOOP ELECTROSURGICAL EXCISION PROCEDURE (LEEP);  Surgeon: Brayton Mars, MD;  Location: [facility] ORS;  Service: Gynecology;  Laterality: N/A;   REMOVAL OF DRUG DELIVERY IMPLANT Left 12/15/2014   Procedure: REMOVAL OF DRUG DELIVERY IMPLANT;  Surgeon: Gae Dry, MD;  Location: [facility] ORS;  Service: Gynecology;  Laterality: Left;    Prior to Admission medications   Medication Sig Start Date End Date Taking? Authorizing Provider  lactulose (CHRONULAC) 10 GM/15ML solution Take 30 mLs (20 g total) by mouth daily as needed for mild constipation. 05/31/21  Yes Paulette Blanch, MD  Accu-Chek Softclix Lancets lancets 1 each by Other route 4 (four) times daily. Use as instructed 05/22/21   Malachy Mood, MD  azithromycin (ZITHROMAX Z-PAK) 250 MG tablet Take 2 tablets (500 mg)  on  Day 1,  followed by 1 tablet (250 mg) once daily on Days 2 through 5. 05/16/21   Menshew, Dannielle Karvonen, PA-C  Blood Glucose Monitoring Suppl (ACCU-CHEK GUIDE) w/Device KIT 1 kit by Does not apply route as directed. 05/22/21   Malachy Mood, MD  budesonide (RHINOCORT AQUA) 32 MCG/ACT nasal spray Place into the nose. 04/17/21 04/17/22  [provider]  glucose blood (ACCU-CHEK GUIDE) test strip Use as instructed 05/22/21   Malachy Mood, MD  ondansetron (ZOFRAN ODT) 4 MG disintegrating tablet Take 1 tablet (4 mg total) by mouth every 8 (eight) hours as needed. 05/16/21   Menshew, Dannielle Karvonen, PA-C  polyethylene glycol powder (MIRALAX) 17 GM/SCOOP powder Take 9 g by mouth daily. 05/30/21   Naaman Plummer, MD  Prenatal Vit-Fe Fumarate-FA (MULTIVITAMIN-PRENATAL) 27-0.8 MG TABS tablet Take 1 tablet by mouth daily at 12 noon.    [provider]  promethazine (PHENERGAN) 25 MG tablet Take 1 tablet (25 mg total) by mouth every 8 (eight) hours as needed for nausea or vomiting. 10/18/20 11/13/20  Couture, Cortni S, PA-C    Allergies Kiwi extract, Amoxicillin, Ciprofloxacin, Penicillins, Tramadol, Cranberry, Latex, and Sulfa antibiotics  Family History  Problem Relation Age of Onset   Hepatitis C Mother    Hepatitis C Father    Breast cancer Neg Hx    Ovarian cancer Neg Hx    Colon cancer Neg Hx     Social History Social History   Tobacco Use   Smoking status: Every Day    Packs/day: 1.00    Years: 5.00    Pack years: 5.00    Types: Cigarettes   Smokeless tobacco: Never  Vaping Use   Vaping Use: Never used  Substance Use Topics   Alcohol use: Yes    Comment: occasional    Drug use: No    Review of Systems  Constitutional: No fever/chills Eyes: No visual changes. ENT: No sore throat. Cardiovascular: Denies chest pain. Respiratory: Denies shortness of breath. Gastrointestinal: Positive for abdominal pain.  No nausea, no vomiting.  No diarrhea.  Positive for constipation. Genitourinary: Negative for dysuria. Musculoskeletal: Negative for back pain. Skin: Negative for rash. Neurological: Negative for headaches, focal weakness or numbness.   ____________________________________________   PHYSICAL EXAM:  VITAL SIGNS: ED Triage Vitals  Enc Vitals Group     BP 05/31/21 0018 125/80     Pulse Rate 05/31/21 0018 (!) 106     Resp 05/31/21 0018 20     Temp 05/31/21 0018 97.8 F (36.6 C)      Temp Source 05/31/21 0018 Oral     SpO2 05/31/21 0018 99 %     Weight 05/31/21 0023 208 lb (94.3 kg)     Height 05/31/21 0023 4' 11" (1.499 m)     Head Circumference --      Peak Flow --      Pain Score 05/31/21 0023 10     Pain Loc --      Pain Edu? --      Excl. in [place]? --     Constitutional: Alert and oriented.  Uncomfortable appearing and in mild acute distress. Eyes: Conjunctivae are normal. PERRL. EOMI. Head: Atraumatic. Nose: No congestion/rhinnorhea. Mouth/Throat: Mucous membranes are moist.   Neck: No stridor.   Cardiovascular: Normal rate, regular rhythm. Grossly normal heart sounds.  Good peripheral circulation. Respiratory: Normal respiratory effort.  No retractions. Lungs CTAB. Gastrointestinal: Soft and nontender to light or deep palpation. No  distention. No abdominal bruits. No CVA tenderness. Musculoskeletal: No lower extremity tenderness nor edema.  No joint effusions. Neurologic:  Normal speech and language. No gross focal neurologic deficits are appreciated. No gait instability. Skin:  Skin is warm, dry and intact. No rash noted. Psychiatric: Mood and affect are normal. Speech and behavior are normal.  ____________________________________________   LABS (all labs ordered are listed, but only abnormal results are displayed)  Labs Reviewed - No data to display ____________________________________________  EKG  None ____________________________________________  RADIOLOGY I, Finnlee Silvernail J, personally viewed and evaluated these images (plain radiographs) as part of my medical decision making, as well as reviewing the written report by the radiologist.  ED MD interpretation: None  Official radiology report(s): No results found.  ____________________________________________   PROCEDURES  Procedure(s) performed (including Critical Care):  Fecal disimpaction  Date/Time: 05/31/2021 5:26 AM Performed by: Paulette Blanch, MD Authorized by: Paulette Blanch, MD   Consent: Verbal consent obtained. Risks and benefits: risks, benefits and alternatives were discussed Consent given by: patient Patient understanding: patient states understanding of the procedure being performed Patient identity confirmed: verbally with patient Time out: Immediately prior to procedure a "time out" was called to verify the correct patient, procedure, equipment, support staff and site/side marked as required. Local anesthesia used: yes (topical lidocaine)  Anesthesia: Local anesthesia used: yes (topical lidocaine)  Sedation: Patient sedated: no  Patient tolerance: patient tolerated the procedure well with no immediate complications Comments: Digitally stimulated semi-soft stool ball palpated in rectal vault     ____________________________________________   INITIAL IMPRESSION / ASSESSMENT AND PLAN / ED COURSE  As part of my medical decision making, I reviewed the following data within the [facility] History obtained from family, Nursing notes reviewed and incorporated, Old chart reviewed ([facility] visit from 05/30/2021), and Notes from prior ED visits     25 year old female approximately [redacted] weeks pregnant who returns to the ED for continued constipation and rectal pain status post disimpaction yesterday.  Differential diagnosis includes but is not limited to constipation, ileus, small bowel obstruction, etc.  Low suspicion for small bowel obstruction as patient is not vomiting on this visit; more likely ileus secondary to constipation.  Patient willing to try additional disimpaction using viscous lidocaine as well as soapsuds enema.  Clinical Course as of 05/31/21 0530  Fri May 31, 2021  0525 Patient had successful bowel movement and is feeling much better.  We will discharge her home with prescription for lactulose and daily bowel regimen with MiraLAX and Colace.  She has Zofran at home to use as needed.  Strict return precautions given.   Patient and her mother verbalized understanding and agree with plan of care. [JS]    Clinical Course User Index [JS] Paulette Blanch, MD     ____________________________________________   FINAL CLINICAL IMPRESSION(S) / ED DIAGNOSES  Final diagnoses:  Constipation, unspecified constipation type  Fecal impaction [facility]     ED Discharge Orders          Ordered    lactulose ([place]) 10 GM/15ML solution  Daily PRN        05/31/21 0528             Note:  This document was prepared using Dragon voice recognition software and may include unintentional dictation errors.    Paulette Blanch, MD 05/31/21 [phone]

## 2021-05-31 NOTE — Discharge Instructions (Signed)
1.  Take Lactulose daily as needed for bowel movements.  I recommend taking this at least for the next 5 to 7 days, then as needed. 2.  I recommend the following daily routine to regulate your bowel movements: MiraLAX Colace twice daily Fiber Plenty of fluids 3.  Return to the ER for worsening symptoms, persistent vomiting, difficulty breathing or other concerns.

## 2021-05-31 NOTE — ED Triage Notes (Signed)
Pt in via [facility], sent here previously today from [facility] for enema/disimpaction.  Reports manual disimpaction today and sent home with Miralax.  Reports using Mirilax, Fleets Enema, and Glycerin Suppository at home since without any relief.  Pt states she is so uncomfortable that she cannot sit.  Tearful in triage.

## 2021-06-03 ENCOUNTER — Telehealth: Payer: Self-pay

## 2021-06-03 ENCOUNTER — Ambulatory Visit: Payer: Medicaid Other | Admitting: *Deleted

## 2021-06-03 NOTE — Telephone Encounter (Signed)
Transition Care Management Unsuccessful Follow-up Telephone Call  Date of discharge and from where:  05/31/2021 from [facility]  Attempts:  1st Attempt  Reason for unsuccessful TCM follow-up call:  Voice mail full

## 2021-06-04 NOTE — Telephone Encounter (Signed)
Transition Care Management Unsuccessful Follow-up Telephone Call  Date of discharge and from where:  05/31/2021 from [facility]  Attempts:  2nd Attempt  Reason for unsuccessful TCM follow-up call:  Voice mail full

## 2021-06-05 NOTE — Telephone Encounter (Signed)
Transition Care Management Unsuccessful Follow-up Telephone Call  Date of discharge and from where:  05/31/2021 from [facility]  Attempts:  3rd Attempt  Reason for unsuccessful TCM follow-up call:  Unable to reach patient

## 2021-06-13 DIAGNOSIS — Z419 Encounter for procedure for purposes other than remedying health state, unspecified: Secondary | ICD-10-CM | POA: Diagnosis not present

## 2021-06-14 ENCOUNTER — Other Ambulatory Visit: Payer: Self-pay

## 2021-06-14 ENCOUNTER — Ambulatory Visit (INDEPENDENT_AMBULATORY_CARE_PROVIDER_SITE_OTHER): Payer: Medicaid Other | Admitting: Obstetrics and Gynecology

## 2021-06-14 VITALS — BP 126/80 | Wt 207.0 lb

## 2021-06-14 DIAGNOSIS — O24410 Gestational diabetes mellitus in pregnancy, diet controlled: Secondary | ICD-10-CM

## 2021-06-14 DIAGNOSIS — O0991 Supervision of high risk pregnancy, unspecified, first trimester: Secondary | ICD-10-CM

## 2021-06-14 DIAGNOSIS — Z3A16 16 weeks gestation of pregnancy: Secondary | ICD-10-CM

## 2021-06-14 NOTE — Progress Notes (Signed)
ROB - no concerns. RM 4 °

## 2021-06-14 NOTE — Progress Notes (Signed)
Routine Prenatal Care Visit  Subjective  Brooke Aguirre is a 25 y.o. G3P2002 at [redacted]w[redacted]d being seen today for ongoing prenatal care.  She is currently monitored for the following issues for this high-risk pregnancy and has Dysplasia of cervix, high grade CIN 2; Tobacco user; Status post LEEP (loop electrosurgical excision procedure) of cervix; Nephrolithiasis; Left nephrolithiasis; Heart murmur; Pyelonephritis; URI, acute; Supervision of high-risk pregnancy; Obesity affecting pregnancy; Contact with or exposure to varicella; and Gestational diabetes mellitus (GDM), antepartum on their problem list.  ----------------------------------------------------------------------------------- Patient reports no complaints.   Contractions: Not present. Vag. Bleeding: None.  Movement: Present. Denies leaking of fluid.  ----------------------------------------------------------------------------------- The following portions of the patient's history were reviewed and updated as appropriate: allergies, current medications, past family history, past medical history, past social history, past surgical history and problem list. Problem list updated.   Objective  Blood pressure 126/80, weight 207 lb (93.9 kg), last menstrual period 02/16/2021. Pregravid weight 220 lb (99.8 kg) Total Weight Gain -13 lb (-5.897 kg) Urinalysis:      Fetal Status: Fetal Heart Rate (bpm): 145   Movement: Present     General:  Alert, oriented and cooperative. Patient is in no acute distress.  Skin: Skin is warm and dry. No rash noted.   Cardiovascular: Normal heart rate noted  Respiratory: Normal respiratory effort, no problems with respiration noted  Abdomen: Soft, gravid, appropriate for gestational age. Pain/Pressure: Absent     Pelvic:  Cervical exam deferred        Extremities: Normal range of motion.     ental Status: Normal mood and affect. Normal behavior. Normal judgment and thought content.   Date Fasting Breakfast  Lunch Dinner  11/27 86 101 94 115  11/28 87 135 99 119  11/29 89 85 97 117  11/30 85 94 117 132  12/1 95 97    12/2 97 116      Assessment   25 y.o. G3P2002 at [redacted]w[redacted]d by  11/23/2021, by Last Menstrual Period presenting for routine prenatal visit  Plan   pregnancy 3 Problems (from 04/12/21 to present)     Problem Noted Resolved   Supervision of high-risk pregnancy 04/12/2021 by Rod Can, CNM No   Overview Addendum 05/16/2021  9:51 PM by Malachy Mood, MD     Nursing Staff Provider  Office Location  [facility] Dating  LMP = 9 week [place]  Language  English Anatomy US    Flu Vaccine   Genetic Screen  NIPS: Normal XY  TDaP vaccine    Hgb A1C or  GTT Early : Third trimester :   Covid    LAB RESULTS   Rhogam   Blood Type --/--/A POS Performed at [facility], [address] [place], [place] [address]  [phone])   Feeding Plan  Antibody    Contraception  Rubella    Circumcision  RPR     Pediatrician   HBsAg     Support Person  HIV    Prenatal Classes  Varicella     GBS  (For PCN allergy, check sensitivities)   BTL Consent     VBAC Consent  Pap  2021 negative    Hgb Electro    Pelvis Tested  CF Negative     SMA Negative    Fragile-X Negative  Hx LEEP in 2019 Hx IOL x2 IUGR 2014/2017      Obesity affecting pregnancy 04/12/2021 by Rod Can, CNM No  Gestational age appropriate obstetric precautions including but not limited to vaginal bleeding, contractions, leaking of fluid and fetal movement were reviewed in detail with the patient.    1) GDM - BG remains well controlled  2) Constipation - resolved post enema at ER  3) Anatomy scan scheduled/pending  Return in about 4 weeks (around 07/12/2021) for ROB.  Vena Austria, MD, [facility], [facility] 06/14/2021, 10:29 AM

## 2021-06-18 ENCOUNTER — Other Ambulatory Visit: Payer: Self-pay

## 2021-06-18 ENCOUNTER — Encounter: Payer: Self-pay | Admitting: *Deleted

## 2021-06-18 ENCOUNTER — Encounter: Payer: Medicaid Other | Attending: Obstetrics and Gynecology | Admitting: *Deleted

## 2021-06-18 VITALS — BP 92/60 | Ht 59.0 in | Wt 203.7 lb

## 2021-06-18 DIAGNOSIS — O24410 Gestational diabetes mellitus in pregnancy, diet controlled: Secondary | ICD-10-CM

## 2021-06-18 DIAGNOSIS — Z3A21 21 weeks gestation of pregnancy: Secondary | ICD-10-CM | POA: Diagnosis not present

## 2021-06-18 DIAGNOSIS — O24419 Gestational diabetes mellitus in pregnancy, unspecified control: Secondary | ICD-10-CM | POA: Diagnosis not present

## 2021-06-18 NOTE — Progress Notes (Signed)
Diabetes Self-Management Education  Visit Type: First/Initial  Appt. Start Time: 0850 Appt. End Time: 1030  06/18/2021  [name], identified by name and date of birth, is a 25 y.o. female with a diagnosis of Diabetes: Gestational Diabetes.   ASSESSMENT  Blood pressure 92/60, height 4\' 11"  (1.499 m), weight 203 lb 11.2 oz (92.4 kg), last menstrual period 02/16/2021, estimated date of delivery 11/23/2021 Body mass index is 41.14 kg/m.   Diabetes Self-Management Education - 06/18/21 1103       Visit Information   Visit Type First/Initial      Initial Visit   Diabetes Type Gestational Diabetes    Are you currently following a meal plan? Yes    What type of meal plan do you follow? "cut out all sugars"    Are you taking your medications as prescribed? Yes    Date Diagnosed 6 weeks ago      Health Coping   How would you rate your overall health? Fair      Psychosocial Assessment   Patient Belief/Attitude about Diabetes Other (comment)   "trying to deal with it"   Self-care barriers None    Self-management support Doctor's office;Friends;Family    Patient Concerns Nutrition/Meal planning;Glycemic Control;Weight Control;Monitoring;Healthy Lifestyle    Special Needs None    Preferred Learning Style Auditory;Visual    Learning Readiness Change in progress    How often do you need to have someone help you when you read instructions, pamphlets, or other written materials from your doctor or pharmacy? 1 - Never    What is the last grade level you completed in school? grade 9      Pre-Education Assessment   Patient understands the diabetes disease and treatment process. Needs Instruction    Patient understands incorporating nutritional management into lifestyle. Needs Instruction    Patient undertands incorporating physical activity into lifestyle. Needs Instruction    Patient understands using medications safely. Needs Instruction    Patient understands monitoring blood  glucose, interpreting and using results Needs Review    Patient understands prevention, detection, and treatment of acute complications. Needs Instruction    Patient understands prevention, detection, and treatment of chronic complications. Needs Instruction    Patient understands how to develop strategies to address psychosocial issues. Needs Instruction    Patient understands how to develop strategies to promote health/change behavior. Needs Instruction      Complications   How often do you check your blood sugar? 3-4 times/day    Fasting Blood glucose range (mg/dL) 70-129   Pt reports fasting 80-90 mg/dL   Postprandial Blood glucose range (mg/dL) 70-129   Pt reports pp's 100-115 mg/dL except after Thanksgiving with reading of 137 mg/dL.   Have you had a dilated eye exam in the past 12 months? No    Have you had a dental exam in the past 12 months? No    Are you checking your feet? No      Dietary Intake   Breakfast skips - reports she is sick in the mornings and can't eat until lunch    Lunch 12:30 pm - sandwich, spaghetti    Snack (afternoon) 4-5 pm apple, chips/salsa, left overs that she doesn't finish with lunch/supper    Dinner 7 pm - beef, chicken, pork; potatoes, corn, rice, pasta, green beans, broccoli, cauliflower, salads sometimes with BBQ and lettuce, tomato, onions, cheese, ranch dressing    Snack (evening) 9 pm and 12-1 am snack due to work schedule (left overs, yogurt,  fruit - grapes, apple, pineapple, cantaloup, slim jim)    Beverage(s) water, sugar free soda, Gatorade zero      Exercise   Exercise Type ADL's      Patient Education   Previous Diabetes Education No    Disease state  Definition of diabetes, type 1 and 2, and the diagnosis of diabetes;Factors that contribute to the development of diabetes    Nutrition management  Role of diet in the treatment of diabetes and the relationship between the three main macronutrients and blood glucose level;Food label reading,  portion sizes and measuring food.;Reviewed blood glucose goals for pre and post meals and how to evaluate the patients' food intake on their blood glucose level.    Physical activity and exercise  Role of exercise on diabetes management, blood pressure control and cardiac health.    Medications Other (comment)   Limited use of oral medications during pregnancy and potential for insulin.   Monitoring Purpose and frequency of SMBG.;Taught/discussed recording of test results and interpretation of SMBG.;Identified appropriate SMBG and/or A1C goals.;Ketone testing, when, how.    Chronic complications Relationship between chronic complications and blood glucose control    Psychosocial adjustment Identified and addressed patients feelings and concerns about diabetes    Preconception care Pregnancy and GDM  Role of pre-pregnancy blood glucose control on the development of the fetus;Reviewed with patient blood glucose goals with pregnancy;Role of family planning for patients with diabetes    Personal strategies to promote health Other (comment)   Reviewed risk of smoking. Pt reports she hasn't been able to quit with last 2 pregnancies.     Individualized Goals (developed by patient)   Reducing Risk Other (comment)   improve blood sugars, prevent diabetes complications, lose weight, lead a healthier lifestyle, become more fit     Outcomes   Expected Outcomes Demonstrated interest in learning. Expect positive outcomes    Future DMSE --   2 days       Individualized Plan for Diabetes Self-Management Training:   Learning Objective:  Patient will have a greater understanding of diabetes self-management. Patient education plan is to attend individual and/or group sessions per assessed needs and concerns.   Plan:   Patient Instructions  Read booklet on Gestational Diabetes Follow Gestational Meal Planning Guidelines Limit fried foods Complete a 3 Day Food Record and bring to next appointment Check  blood sugars 4 x day - before breakfast and 2 hrs after every meal and record  Bring blood sugar log to all appointments Purchase urine ketone strips if instructed by MD and check urine ketones every am:  If + increase bedtime snack to 1 protein and 2 carbohydrate servings Walk 20-30 minutes at least 5 x week if permitted by MD Quit/Decrease smoking  Expected Outcomes:  Demonstrated interest in learning. Expect positive outcomes  Education material provided:  Gestational Booklet Gestational Meal Planning Guidelines Simple Meal Plan Viewed Gestational Diabetes Video 3 Day Food Record Goals for a Healthy Pregnancy   If problems or questions, patient to contact team via:   Sharion Settler, RN, CCM, CDCES [phone]  Future DSME appointment:  (2 days) June 20, 2021 with the dietitian

## 2021-06-18 NOTE — Patient Instructions (Signed)
Read booklet on Gestational Diabetes Follow Gestational Meal Planning Guidelines Limit fried foods Complete a 3 Day Food Record and bring to next appointment Check blood sugars 4 x day - before breakfast and 2 hrs after every meal and record  Bring blood sugar log to all appointments Purchase urine ketone strips if instructed by MD and check urine ketones every am:  If + increase bedtime snack to 1 protein and 2 carbohydrate servings Walk 20-30 minutes at least 5 x week if permitted by MD Quit/Decrease smoking

## 2021-06-20 ENCOUNTER — Ambulatory Visit: Payer: Medicaid Other | Admitting: Dietician

## 2021-06-20 ENCOUNTER — Other Ambulatory Visit: Payer: Self-pay

## 2021-06-25 ENCOUNTER — Other Ambulatory Visit: Payer: Self-pay

## 2021-06-25 ENCOUNTER — Ambulatory Visit: Payer: Medicaid Other | Attending: Maternal & Fetal Medicine

## 2021-06-25 DIAGNOSIS — Z3A18 18 weeks gestation of pregnancy: Secondary | ICD-10-CM | POA: Diagnosis not present

## 2021-06-25 DIAGNOSIS — O09292 Supervision of pregnancy with other poor reproductive or obstetric history, second trimester: Secondary | ICD-10-CM | POA: Diagnosis not present

## 2021-06-25 DIAGNOSIS — O99212 Obesity complicating pregnancy, second trimester: Secondary | ICD-10-CM | POA: Insufficient documentation

## 2021-06-25 DIAGNOSIS — Z363 Encounter for antenatal screening for malformations: Secondary | ICD-10-CM | POA: Diagnosis not present

## 2021-06-25 DIAGNOSIS — O99213 Obesity complicating pregnancy, third trimester: Secondary | ICD-10-CM | POA: Diagnosis not present

## 2021-06-25 DIAGNOSIS — Z3689 Encounter for other specified antenatal screening: Secondary | ICD-10-CM | POA: Diagnosis not present

## 2021-06-25 DIAGNOSIS — O24410 Gestational diabetes mellitus in pregnancy, diet controlled: Secondary | ICD-10-CM | POA: Diagnosis not present

## 2021-06-25 DIAGNOSIS — E119 Type 2 diabetes mellitus without complications: Secondary | ICD-10-CM | POA: Diagnosis not present

## 2021-06-25 DIAGNOSIS — O0991 Supervision of high risk pregnancy, unspecified, first trimester: Secondary | ICD-10-CM

## 2021-06-25 DIAGNOSIS — Z369 Encounter for antenatal screening, unspecified: Secondary | ICD-10-CM

## 2021-06-25 DIAGNOSIS — O99211 Obesity complicating pregnancy, first trimester: Secondary | ICD-10-CM

## 2021-06-26 ENCOUNTER — Encounter: Payer: Medicaid Other | Admitting: Obstetrics and Gynecology

## 2021-06-26 ENCOUNTER — Ambulatory Visit (INDEPENDENT_AMBULATORY_CARE_PROVIDER_SITE_OTHER): Payer: Medicaid Other | Admitting: Obstetrics & Gynecology

## 2021-06-26 VITALS — Wt 203.0 lb

## 2021-06-26 DIAGNOSIS — Q270 Congenital absence and hypoplasia of umbilical artery: Secondary | ICD-10-CM

## 2021-06-26 DIAGNOSIS — O24410 Gestational diabetes mellitus in pregnancy, diet controlled: Secondary | ICD-10-CM | POA: Insufficient documentation

## 2021-06-26 DIAGNOSIS — O0991 Supervision of high risk pregnancy, unspecified, first trimester: Secondary | ICD-10-CM

## 2021-06-26 DIAGNOSIS — Z3A18 18 weeks gestation of pregnancy: Secondary | ICD-10-CM

## 2021-06-26 DIAGNOSIS — O09892 Supervision of other high risk pregnancies, second trimester: Secondary | ICD-10-CM

## 2021-06-26 DIAGNOSIS — O99212 Obesity complicating pregnancy, second trimester: Secondary | ICD-10-CM

## 2021-06-26 NOTE — Progress Notes (Signed)
Virtual Visit via Telephone Note  I connected with patient on 06/26/21 at  2:50 PM EST by telephone and verified that I am speaking with the correct person using two identifiers.   I discussed the limitations, risks, security and privacy concerns of performing an evaluation and management service by telephone and the availability of in person appointments. I also discussed with the patient that there may be a patient responsible charge related to this service. The patient expressed understanding and agreed to proceed.  The patient was at home I spoke with the patient from my  office  Brooke Aguirre is a 25 y.o. G3P2002 at [redacted]w[redacted]d being seen today for ongoing prenatal care.  She is currently monitored for the following issues for this high-risk pregnancy and has Dysplasia of cervix, high grade CIN 2; Tobacco user; Status post LEEP (loop electrosurgical excision procedure) of cervix; Nephrolithiasis; Left nephrolithiasis; Heart murmur; Pyelonephritis; URI, acute; Supervision of high-risk pregnancy; Obesity affecting pregnancy; Contact with or exposure to varicella; Gestational diabetes mellitus (GDM), antepartum; Single umbilical artery; Obesity affecting pregnancy in second trimester; and Diet controlled gestational diabetes mellitus (GDM) in second trimester on their problem list.  ----------------------------------------------------------------------------------- Patient reports  FBS all WNL, and rarely checks rest of day (work) .   Denies pain, VB, leaking of fluid.  ----------------------------------------------------------------------------------- The following portions of the patient's history were reviewed and updated as appropriate: allergies, current medications, past family history, past medical history, past social history, past surgical history and problem list. Problem list updated.   Objective  Weight 203 lb (92.1 kg), last menstrual period 02/16/2021. Pregravid weight 220 lb (99.8  kg) Total Weight Gain -17 lb (-7.711 kg)  Physical Exam could not be performed. Because of the COVID-19 outbreak this visit was performed over the phone and not in person.   Assessment   25 y.o. [id number] at [redacted]w[redacted]d by  11/23/2021, by Last Menstrual Period presenting for routine prenatal visit  Plan  [place] discussed SUA risk factors discussed BS log discussed, needs to collect more BS values daily PNV  pregnancy 3 Problems (from 04/12/21 to present)     Problem Noted Resolved   Supervision of high-risk pregnancy 04/12/2021 by Rod Can, CNM No   Overview Addendum 05/16/2021  9:51 PM by Malachy Mood, MD     Nursing Staff Provider  Office Location  [facility] Dating  LMP = 9 week [place]  Language  English Anatomy US    Flu Vaccine   Genetic Screen  NIPS: Normal XY  TDaP vaccine    Hgb A1C or  GTT Early : Third trimester :   Covid    LAB RESULTS   Rhogam   Blood Type --/--/A POS Performed at [facility], [address] [place], [place] [address]  [phone])   Feeding Plan  Antibody    Contraception  Rubella    Circumcision  RPR     Pediatrician   HBsAg     Support Person  HIV    Prenatal Classes  Varicella     GBS  (For PCN allergy, check sensitivities)   BTL Consent     VBAC Consent  Pap  2021 negative    Hgb Electro    Pelvis Tested  CF Negative     SMA Negative    Fragile-X Negative  Hx LEEP in 2019 Hx IOL x2 IUGR 2014/2017      Obesity affecting pregnancy 04/12/2021 by Rod Can, CNM No      Gestational age appropriate  obstetric precautions including but not limited to vaginal bleeding, contractions, leaking of fluid and fetal movement were reviewed in detail with the patient.     Follow Up Instructions: 2 weeks f/u   I discussed the assessment and treatment plan with the patient. The patient was provided an opportunity to ask questions and all were answered. The patient agreed with the plan and demonstrated an understanding of the instructions.    The patient was advised to call back or seek an in-person evaluation if the symptoms worsen or if the condition fails to improve as anticipated.  I provided 15 minutes of non-face-to-face time during this encounter.  Annamarie Major, MD [facility], [facility] 06/26/2021 3:03 PM

## 2021-07-12 ENCOUNTER — Ambulatory Visit (INDEPENDENT_AMBULATORY_CARE_PROVIDER_SITE_OTHER): Payer: Medicaid Other | Admitting: Advanced Practice Midwife

## 2021-07-12 ENCOUNTER — Other Ambulatory Visit: Payer: Self-pay

## 2021-07-12 ENCOUNTER — Encounter: Payer: Self-pay | Admitting: Advanced Practice Midwife

## 2021-07-12 VITALS — BP 118/76 | Wt 203.0 lb

## 2021-07-12 DIAGNOSIS — O99212 Obesity complicating pregnancy, second trimester: Secondary | ICD-10-CM

## 2021-07-12 DIAGNOSIS — Z3A20 20 weeks gestation of pregnancy: Secondary | ICD-10-CM

## 2021-07-12 DIAGNOSIS — O24410 Gestational diabetes mellitus in pregnancy, diet controlled: Secondary | ICD-10-CM

## 2021-07-12 DIAGNOSIS — O0992 Supervision of high risk pregnancy, unspecified, second trimester: Secondary | ICD-10-CM

## 2021-07-12 NOTE — Progress Notes (Signed)
ROB - no concerns. RM 4 °

## 2021-07-12 NOTE — Progress Notes (Signed)
Routine Prenatal Care Visit  Subjective  Brooke Aguirre is a 25 y.o. G3P2002 at [redacted]w[redacted]d being seen today for ongoing prenatal care.  She is currently monitored for the following issues for this high-risk pregnancy and has Dysplasia of cervix, high grade CIN 2; Tobacco user; Status post LEEP (loop electrosurgical excision procedure) of cervix; Nephrolithiasis; Left nephrolithiasis; Heart murmur; Pyelonephritis; URI, acute; Supervision of high-risk pregnancy; Obesity affecting pregnancy; Contact with or exposure to varicella; Gestational diabetes mellitus (GDM), antepartum; Single umbilical artery; Obesity affecting pregnancy in second trimester; and Diet controlled gestational diabetes mellitus (GDM) in second trimester on their problem list.  ----------------------------------------------------------------------------------- Patient reports no complaints.  She usually checks BS 2 times per day (most days); fasting and after dinner. Fasting is always normal. After dinner is usually less than 120 with occasional value of 130. She is trying to do better with checking sugar regularly.  Contractions: Not present. Vag. Bleeding: None.  Movement: Present. Leaking Fluid denies.  ----------------------------------------------------------------------------------- The following portions of the patient's history were reviewed and updated as appropriate: allergies, current medications, past family history, past medical history, past social history, past surgical history and problem list. Problem list updated.  Objective  Blood pressure 118/76, weight 203 lb (92.1 kg), last menstrual period 02/16/2021. Pregravid weight 220 lb (99.8 kg) Total Weight Gain -17 lb (-7.711 kg) Urinalysis: Urine Protein    Urine Glucose    Fetal Status: Fetal Heart Rate (bpm): 157   Movement: Present      Anatomy scan done recently by MFM is significant for incomplete views of cardiac and single artery umbilical cord  General:   Alert, oriented and cooperative. Patient is in no acute distress.  Skin: Skin is warm and dry. No rash noted.   Cardiovascular: Normal heart rate noted  Respiratory: Normal respiratory effort, no problems with respiration noted  Abdomen: Soft, gravid, appropriate for gestational age. Pain/Pressure: Absent     Pelvic:  Cervical exam deferred        Extremities: Normal range of motion.  Edema: None  Mental Status: Normal mood and affect. Normal behavior. Normal judgment and thought content.   Assessment   25 y.o. [id number] at [redacted]w[redacted]d by  11/23/2021, by Last Menstrual Period presenting for routine prenatal visit  Plan   pregnancy 3 Problems (from 04/12/21 to present)     Problem Noted Resolved   Supervision of high-risk pregnancy 04/12/2021 by Tresea Mall, CNM No   Overview Addendum 05/16/2021  9:51 PM by Vena Austria, MD     Nursing Staff Provider  Office Location  [facility] Dating  LMP = 9 week [place]  Language  English Anatomy US    Flu Vaccine   Genetic Screen  NIPS: Normal XY  TDaP vaccine    Hgb A1C or  GTT Early : Third trimester :   Covid    LAB RESULTS   Rhogam   Blood Type --/--/A POS Performed at [facility], [address] [place], [place] [address]  [phone])   Feeding Plan  Antibody    Contraception  Rubella    Circumcision  RPR     Pediatrician   HBsAg     Support Person  HIV    Prenatal Classes  Varicella     GBS  (For PCN allergy, check sensitivities)   BTL Consent     VBAC Consent  Pap  2021 negative    Hgb Electro    Pelvis Tested  CF Negative     SMA Negative  Fragile-X Negative   Hx LEEP in 2019 Hx IOL x2 IUGR 2014/2017      Obesity affecting pregnancy 04/12/2021 by Rod Can, CNM No        Preterm labor symptoms and general obstetric precautions including but not limited to vaginal bleeding, contractions, leaking of fluid and fetal movement were reviewed in detail with the patient. Please refer to After Visit Summary for  other counseling recommendations.  Follow up ultrasound with MFM 07/30/21  Return in about 4 weeks (around 08/09/2021) for rob.  Rod Can, CNM 07/12/2021 2:30 PM

## 2021-07-14 DIAGNOSIS — Z419 Encounter for procedure for purposes other than remedying health state, unspecified: Secondary | ICD-10-CM | POA: Diagnosis not present

## 2021-07-17 ENCOUNTER — Encounter: Payer: Self-pay | Admitting: Dietician

## 2021-07-25 ENCOUNTER — Other Ambulatory Visit: Payer: Self-pay

## 2021-07-25 DIAGNOSIS — O24410 Gestational diabetes mellitus in pregnancy, diet controlled: Secondary | ICD-10-CM

## 2021-07-25 DIAGNOSIS — O99212 Obesity complicating pregnancy, second trimester: Secondary | ICD-10-CM

## 2021-07-25 DIAGNOSIS — Q270 Congenital absence and hypoplasia of umbilical artery: Secondary | ICD-10-CM

## 2021-07-26 ENCOUNTER — Other Ambulatory Visit: Payer: Self-pay

## 2021-07-26 ENCOUNTER — Ambulatory Visit (INDEPENDENT_AMBULATORY_CARE_PROVIDER_SITE_OTHER): Payer: Medicaid Other | Admitting: Licensed Practical Nurse

## 2021-07-26 VITALS — BP 96/60 | Wt 202.0 lb

## 2021-07-26 DIAGNOSIS — N949 Unspecified condition associated with female genital organs and menstrual cycle: Secondary | ICD-10-CM | POA: Diagnosis not present

## 2021-07-26 DIAGNOSIS — R399 Unspecified symptoms and signs involving the genitourinary system: Secondary | ICD-10-CM | POA: Diagnosis not present

## 2021-07-26 DIAGNOSIS — R350 Frequency of micturition: Secondary | ICD-10-CM | POA: Diagnosis not present

## 2021-07-26 LAB — POCT WET PREP (WET MOUNT): Trichomonas Wet Prep HPF POC: ABSENT

## 2021-07-26 LAB — POCT URINALYSIS DIPSTICK OB
Bilirubin, UA: NEGATIVE
Glucose, UA: NEGATIVE
Ketones, UA: NEGATIVE
Leukocytes, UA: NEGATIVE
Nitrite, UA: NEGATIVE
Spec Grav, UA: 1.015 (ref 1.010–1.025)
Urobilinogen, UA: 0.2 E.U./dL
pH, UA: 6.5 (ref 5.0–8.0)

## 2021-07-26 NOTE — Addendum Note (Signed)
Addended by: Carie Caddy on: 07/26/2021 05:05 PM   Modules accepted: Orders

## 2021-07-26 NOTE — Progress Notes (Signed)
Routine Prenatal Care Visit  Subjective  Brooke Aguirre is a 26 y.o. G3P2002 at [redacted]w[redacted]d being seen today for ongoing prenatal care.  She is currently monitored for the following issues for this high-risk pregnancy and has Dysplasia of cervix, high grade CIN 2; Tobacco user; Status post LEEP (loop electrosurgical excision procedure) of cervix; Nephrolithiasis; Left nephrolithiasis; Heart murmur; Pyelonephritis; URI, acute; Supervision of high-risk pregnancy; Obesity affecting pregnancy; Contact with or exposure to varicella; Gestational diabetes mellitus (GDM), antepartum; Single umbilical artery; Obesity affecting pregnancy in second trimester; and Diet controlled gestational diabetes mellitus (GDM) in second trimester on their problem list.  ----------------------------------------------------------------------------------- Patient reports feeling uncomfortable when she urinates, it feels "achy" at the start of urination, it does not burn.  She has had urinary frequency, and when she stretches in a certain she has an urgency to void. This has been happening for 3 days.  Denies fevers or low back pain. Denies having any changes to her vaginal discharge. Has not had IC since she conceived. She does take nightly bubble baths. No recent changes to soaps/products etc.   .  .   . Leaking Fluid denies.  ----------------------------------------------------------------------------------- The following portions of the patient's history were reviewed and updated as appropriate: allergies, current medications, past family history, past medical history, past social history, past surgical history and problem list. Problem list updated.  Objective  Blood pressure 96/60, weight 202 lb (91.6 kg), last menstrual period 02/16/2021. Pregravid weight 220 lb (99.8 kg) Total Weight Gain -18 lb (-8.165 kg) Urinalysis: Urine Protein Small (1+)  Urine Glucose Negative  Fetal Status:           General:  Alert, oriented and  cooperative. Patient is in no acute distress.  Skin: Skin is warm and dry. No rash noted.   Cardiovascular: Normal heart rate noted  Respiratory: Normal respiratory effort, no problems with respiration noted  Abdomen: Soft, gravid, appropriate for gestational age.     Non tender   Pelvic:  Cervical exam deferred       Cervix pink, no lesions, physiologic discharge present, labia irritated looking.   Extremities: Normal range of motion.     Mental Status: Normal mood and affect. Normal behavior. Normal judgment and thought content.   Assessment   26 y.o. G3P2002 at [redacted]w[redacted]d by  11/23/2021, by Last Menstrual Period presenting for work-in prenatal visit  Plan   pregnancy 3 Problems (from 04/12/21 to present)     Problem Noted Resolved   Supervision of high-risk pregnancy 04/12/2021 by Rod Can, CNM No   Overview Addendum 05/16/2021  9:51 PM by Malachy Mood, MD     Nursing Staff Provider  Office Location  [facility] Dating  LMP = 9 week [place]  Language  English Anatomy US    Flu Vaccine   Genetic Screen  NIPS: Normal XY  TDaP vaccine    Hgb A1C or  GTT Early : Third trimester :   Covid    LAB RESULTS   Rhogam   Blood Type --/--/A POS Performed at [facility], [address] [place], [place] [address]  [phone])   Feeding Plan  Antibody    Contraception  Rubella    Circumcision  RPR     Pediatrician   HBsAg     Support Person  HIV    Prenatal Classes  Varicella     GBS  (For PCN allergy, check sensitivities)   BTL Consent     VBAC Consent  Pap  2021 negative    Hgb Electro    Pelvis Tested  CF Negative     SMA Negative    Fragile-X Negative   Hx LEEP in 2019 Hx IOL x2 IUGR 2014/2017      Obesity affecting pregnancy 04/12/2021 by Rod Can, CNM No         -Stop nightly bubble baths, sleep without underwear -urine culture and vaginitis swab sent Keep next [place], [place], [facility]  07/26/21  4:51 PM

## 2021-07-28 LAB — URINE CULTURE

## 2021-07-30 ENCOUNTER — Other Ambulatory Visit: Payer: Self-pay

## 2021-07-30 ENCOUNTER — Ambulatory Visit: Payer: Medicaid Other | Attending: Maternal & Fetal Medicine

## 2021-07-30 DIAGNOSIS — O24410 Gestational diabetes mellitus in pregnancy, diet controlled: Secondary | ICD-10-CM | POA: Diagnosis not present

## 2021-07-30 DIAGNOSIS — E669 Obesity, unspecified: Secondary | ICD-10-CM | POA: Diagnosis not present

## 2021-07-30 DIAGNOSIS — Z3A23 23 weeks gestation of pregnancy: Secondary | ICD-10-CM | POA: Diagnosis not present

## 2021-07-30 DIAGNOSIS — O99212 Obesity complicating pregnancy, second trimester: Secondary | ICD-10-CM | POA: Insufficient documentation

## 2021-07-30 DIAGNOSIS — Q270 Congenital absence and hypoplasia of umbilical artery: Secondary | ICD-10-CM

## 2021-07-30 DIAGNOSIS — O09292 Supervision of pregnancy with other poor reproductive or obstetric history, second trimester: Secondary | ICD-10-CM | POA: Diagnosis not present

## 2021-07-30 DIAGNOSIS — O24419 Gestational diabetes mellitus in pregnancy, unspecified control: Secondary | ICD-10-CM | POA: Diagnosis present

## 2021-07-31 LAB — NUSWAB VAGINITIS PLUS (VG+)
Candida albicans, NAA: NEGATIVE
Candida glabrata, NAA: NEGATIVE
Chlamydia trachomatis, NAA: NEGATIVE
Neisseria gonorrhoeae, NAA: NEGATIVE
Trich vag by NAA: NEGATIVE

## 2021-08-01 ENCOUNTER — Encounter: Payer: Self-pay | Admitting: Licensed Practical Nurse

## 2021-08-09 ENCOUNTER — Other Ambulatory Visit: Payer: Self-pay

## 2021-08-09 ENCOUNTER — Ambulatory Visit (INDEPENDENT_AMBULATORY_CARE_PROVIDER_SITE_OTHER): Payer: Medicaid Other | Admitting: Licensed Practical Nurse

## 2021-08-09 VITALS — BP 110/60 | Wt 201.0 lb

## 2021-08-09 DIAGNOSIS — O24410 Gestational diabetes mellitus in pregnancy, diet controlled: Secondary | ICD-10-CM

## 2021-08-09 DIAGNOSIS — Z3A24 24 weeks gestation of pregnancy: Secondary | ICD-10-CM

## 2021-08-09 DIAGNOSIS — O0992 Supervision of high risk pregnancy, unspecified, second trimester: Secondary | ICD-10-CM

## 2021-08-09 LAB — POCT URINALYSIS DIPSTICK OB
Glucose, UA: NEGATIVE
POC,PROTEIN,UA: NEGATIVE

## 2021-08-09 NOTE — Progress Notes (Signed)
ROB- no concerns 

## 2021-08-09 NOTE — Progress Notes (Signed)
Routine Prenatal Care Visit  Subjective  Brooke Aguirre is a 26 y.o. G3P2002 at [redacted]w[redacted]d being seen today for ongoing prenatal care.  She is currently monitored for the following issues for this high-risk pregnancy and has Dysplasia of cervix, high grade CIN 2; Tobacco user; Status post LEEP (loop electrosurgical excision procedure) of cervix; Nephrolithiasis; Left nephrolithiasis; Heart murmur; Pyelonephritis; URI, acute; Supervision of high-risk pregnancy; Obesity affecting pregnancy; Contact with or exposure to varicella; Gestational diabetes mellitus (GDM), antepartum; Single umbilical artery; Obesity affecting pregnancy in second trimester; and Diet controlled gestational diabetes mellitus (GDM) in second trimester on their problem list.  ----------------------------------------------------------------------------------- Patient reports no complaints.  She admits to not checking her glucose levels regularly. She is doing morning levels and they have been ranging from 70-90.  .  .   . Leaking Fluid denies.  ----------------------------------------------------------------------------------- The following portions of the patient's history were reviewed and updated as appropriate: allergies, current medications, past family history, past medical history, past social history, past surgical history and problem list. Problem list updated.  Objective  Blood pressure 110/60, weight 201 lb (91.2 kg), last menstrual period 02/16/2021. Pregravid weight 220 lb (99.8 kg) Total Weight Gain -19 lb (-8.618 kg) Urinalysis: Urine Protein    Urine Glucose    Fetal Status:           General:  Alert, oriented and cooperative. Patient is in no acute distress.  Skin: Skin is warm and dry. No rash noted.   Cardiovascular: Normal heart rate noted  Respiratory: Normal respiratory effort, no problems with respiration noted  Abdomen: Soft, gravid, appropriate for gestational age.       Pelvic:  Cervical exam deferred         Extremities: Normal range of motion.     Mental Status: Normal mood and affect. Normal behavior. Normal judgment and thought content.   Assessment   26 y.o. [id number] at [redacted]w[redacted]d by  11/23/2021, by Last Menstrual Period presenting for routine prenatal visit  Plan   pregnancy 3 Problems (from 04/12/21 to present)    Problem Noted Resolved   Supervision of high-risk pregnancy 04/12/2021 by Rod Can, CNM No   Overview Addendum 05/16/2021  9:51 PM by Malachy Mood, MD     Nursing Staff Provider  Office Location  [facility] Dating  LMP = 9 week [place]  Language  English Anatomy US    Flu Vaccine   Genetic Screen  NIPS: Normal XY  TDaP vaccine    Hgb A1C or  GTT Early : Third trimester :   Covid    LAB RESULTS   Rhogam   Blood Type --/--/A POS Performed at [facility], [address] [place], [place] [address]  [phone])   Feeding Plan  Antibody    Contraception  Rubella    Circumcision  RPR     Pediatrician   HBsAg     Support Person  HIV    Prenatal Classes  Varicella     GBS  (For PCN allergy, check sensitivities)   BTL Consent     VBAC Consent  Pap  2021 negative    Hgb Electro    Pelvis Tested  CF Negative     SMA Negative    Fragile-X Negative   Hx LEEP in 2019 Hx IOL x2 IUGR 2014/2017      Obesity affecting pregnancy 04/12/2021 by Rod Can, CNM No       Preterm labor symptoms and general obstetric precautions including but not  limited to vaginal bleeding, contractions, leaking of fluid and fetal movement were reviewed in detail with the patient. Please refer to After Visit Summary for other counseling recommendations.  We discussed the importance of more regular glucose screening. Advised to bring her readings to next visit. She continues to see MFM for growth scans q 4 weeks.  Return in about 2 weeks (around 08/30/2021) for return OB with MD, Hgb A1C and random glucose please. Review her glucose records.  Will have her see an MD for next  [place], CNM  08/09/2021 4:53 PM    Imagene Riches, CNM  08/09/2021 4:45 PM

## 2021-08-14 DIAGNOSIS — Z419 Encounter for procedure for purposes other than remedying health state, unspecified: Secondary | ICD-10-CM | POA: Diagnosis not present

## 2021-08-15 ENCOUNTER — Telehealth: Payer: Medicaid Other | Admitting: Physician Assistant

## 2021-08-15 ENCOUNTER — Encounter: Payer: Self-pay | Admitting: Advanced Practice Midwife

## 2021-08-15 ENCOUNTER — Ambulatory Visit (INDEPENDENT_AMBULATORY_CARE_PROVIDER_SITE_OTHER): Payer: Medicaid Other | Admitting: Advanced Practice Midwife

## 2021-08-15 ENCOUNTER — Other Ambulatory Visit: Payer: Self-pay

## 2021-08-15 VITALS — BP 118/72 | Ht 59.0 in | Wt 200.0 lb

## 2021-08-15 DIAGNOSIS — O99212 Obesity complicating pregnancy, second trimester: Secondary | ICD-10-CM

## 2021-08-15 DIAGNOSIS — Z113 Encounter for screening for infections with a predominantly sexual mode of transmission: Secondary | ICD-10-CM

## 2021-08-15 DIAGNOSIS — O24410 Gestational diabetes mellitus in pregnancy, diet controlled: Secondary | ICD-10-CM

## 2021-08-15 DIAGNOSIS — O0992 Supervision of high risk pregnancy, unspecified, second trimester: Secondary | ICD-10-CM

## 2021-08-15 DIAGNOSIS — M549 Dorsalgia, unspecified: Secondary | ICD-10-CM

## 2021-08-15 DIAGNOSIS — O99891 Other specified diseases and conditions complicating pregnancy: Secondary | ICD-10-CM

## 2021-08-15 DIAGNOSIS — Z369 Encounter for antenatal screening, unspecified: Secondary | ICD-10-CM

## 2021-08-15 DIAGNOSIS — Z3A25 25 weeks gestation of pregnancy: Secondary | ICD-10-CM

## 2021-08-15 LAB — POCT URINALYSIS DIPSTICK
Bilirubin, UA: NEGATIVE
Blood, UA: NEGATIVE
Glucose, UA: NEGATIVE
Ketones, UA: NEGATIVE
Leukocytes, UA: NEGATIVE
Nitrite, UA: NEGATIVE
Protein, UA: NEGATIVE
Spec Grav, UA: 1.02 (ref 1.010–1.025)
Urobilinogen, UA: 0.2 E.U./dL
pH, UA: 5 (ref 5.0–8.0)

## 2021-08-15 NOTE — Progress Notes (Signed)
For the safety of you and your child, I recommend a face to face office visit with a health care provider.  Many mothers need to take medicines during their pregnancy and while nursing.  Almost all medicines pass into the breast milk in small quantities.  Most are generally considered safe for a mother to take but some medicines must be avoided.  After reviewing your E-Visit request, I recommend that you consult your OB/GYN or pediatrician for medical advice in relation to your condition and prescription medications while pregnant or breastfeeding.  NOTE:  There will be NO CHARGE for this eVisit  If you are having a true medical emergency please call 911.    For an urgent face to face visit, [facility] has six urgent care centers for your convenience:     [facility] [place] Get Driving Directions [phone] [address] [place], [place] [address]    [facility] ([place]) Get Driving Directions [phone] [address] [place], [place] [address]  [facility] ([place]) Get Driving Directions [phone] [address] [place],  [place]  [address]  [facility] Get Driving Directions [phone] [address], [address] [place], [place] [address]   [facility] [place] Get Driving Directions  [phone] [address] [place], [place] [address]   [facility] [place] Get Driving Directions [phone] [address] [place],  [address]  Your MyChart E-visit questionnaire answers were reviewed by a board certified advanced clinical practitioner to complete your personal care plan based on your specific symptoms.  Thank you for using e-Visits. 

## 2021-08-15 NOTE — Patient Instructions (Signed)
Back Pain in Pregnancy Back pain during pregnancy is common. Back pain may be caused by several factors that are related to changes during your pregnancy, including: Your growing baby and uterus. This may result in a changing center of gravity and cause your abdominal muscles to weaken. Pregnancy hormones, which may cause the ligaments of the pelvis to relax. Weight gain during pregnancy. Your posture or position. Follow these instructions at home: Managing pain and stiffness   If directed, put ice on the painful area. To do this: Put ice in a plastic bag. Place a towel between your skin and the bag. Leave the ice on for 20 minutes, 2-3 times per day. Remove the ice if your skin turns bright red. This is very important. If you cannot feel pain, heat, or cold, you have a greater risk of damage to the area. If directed, apply heat to the affected area before you exercise. Use the heat source that your health care provider recommends, such as a moist heat pack or a heating pad. Place a towel between your skin and the heat source. Leave the heat on for 20-30 minutes. Remove the heat if your skin turns bright red. This is especially important if you are unable to feel pain, heat, or cold. You may have a greater risk of getting burned. If directed, massage the affected area. Activity Exercise as told by your health care provider. Gentle exercise is the best way to prevent or manage back pain. Listen to your body when lifting. If lifting hurts, ask for help. Squat down when picking up something from the floor. Do not bend over. Squatting uses your leg muscles instead of your back muscles. Only use bed rest for short periods as told by your health care provider. Bed rest should only be used for the most severe episodes of back pain. Standing, sitting, and lying down Do not stand in one place for long periods of time. Use good posture when sitting. Make sure your head rests over your shoulders and  is not hanging forward. Use a pillow on your lower back if necessary. Try sleeping on your side with a pregnancy support pillow or one or two regular pillows between your legs. It may be best to sleep on your left side. If you have back pain after a night's rest, your bed may be too soft. A firm mattress may provide more support for your back during pregnancy. General instructions Take over-the-counter and prescription medicines only as told by your health care provider. Use a maternity girdle, elastic sling, or back brace as told by your health care provider. Work with a physical therapist or massage therapist to find ways to manage back pain. Acupuncture or massage therapy may be helpful. Eat a healthy diet. Try to gain weight within your health care provider's recommendations. Do not wear high heels. Keep all follow-up visits. This is important. Contact a health care provider if: Your back pain interferes with your daily activities. You have back pain on one side of your back. You have increasing pain in other parts of your body. You have numbness, tingling, weakness, or problems with the use of your arms or legs. You have numbness that travels down one or both of your legs. You have nausea, vomiting, or sweating. Get help right away if: You develop any of the following: Shortness of breath, dizziness, or fainting. Severe back pain that is not controlled with medicine. Changes in bowel or bladder control, or you see blood in   your urine. You have back pain that is a rhythmic, cramping pain similar to labor pains. Labor pains are usually 2 minutes apart, last for about 1 minute, and involve a bearing down feeling or pressure in your pelvis. You have back pain and your water breaks or you have bleeding from your vagina. Your back pain developed after you fell. These symptoms may represent a serious problem that is an emergency. Do not wait to see if the symptoms will go away. Get medical  help right away. Call your local emergency services (911 in the [place] Do not drive yourself to the hospital. Summary Back pain may be caused by several factors that are related to changes during your pregnancy. Follow instructions as told by your health care provider for managing back pain. Take over-the-counter and prescription medicines only as told by your health care provider. Exercise as told by your health care provider. Gentle exercise is the best way to prevent or manage back pain. Keep all follow-up visits. This is important. This information is not intended to replace advice given to you by your health care provider. Make sure you discuss any questions you have with your health care provider. Document Revised: 09/12/2020 Document Reviewed: 09/12/2020 Elsevier Patient Education  2022 [employer]  

## 2021-08-15 NOTE — Progress Notes (Signed)
Routine Prenatal Care Visit  Subjective  Brooke Aguirre is a 26 y.o. G3P2002 at [redacted]w[redacted]d being seen today for ongoing prenatal care.  She is currently monitored for the following issues for this high-risk pregnancy and has Dysplasia of cervix, high grade CIN 2; Tobacco user; Status post LEEP (loop electrosurgical excision procedure) of cervix; Nephrolithiasis; Left nephrolithiasis; Heart murmur; Pyelonephritis; URI, acute; Supervision of high-risk pregnancy; Obesity affecting pregnancy; Contact with or exposure to varicella; Gestational diabetes mellitus (GDM), antepartum; Single umbilical artery; Obesity affecting pregnancy in second trimester; and Diet controlled gestational diabetes mellitus (GDM) in second trimester on their problem list.  ----------------------------------------------------------------------------------- Patient reports all blood sugar levels are normal. Fasting are 70s/80s and after meals range from 80s to 110s. She did not bring BS log. She is having right side low back pain for the past few days. Reviewed all comfort measures and referral to chiropractic sent.   Contractions: Not present. Vag. Bleeding: None.  Movement: Present. Leaking Fluid denies.  ----------------------------------------------------------------------------------- The following portions of the patient's history were reviewed and updated as appropriate: allergies, current medications, past family history, past medical history, past social history, past surgical history and problem list. Problem list updated.  Objective  Blood pressure 118/72, height 4\' 11"  (1.499 m), weight 200 lb (90.7 kg), last menstrual period 02/16/2021. Pregravid weight 220 lb (99.8 kg) Total Weight Gain -20 lb (-9.072 kg) Urinalysis: Urine Protein    Urine Glucose    Fetal Status: Fetal Heart Rate (bpm): 137 Fundal Height: 26 cm Movement: Present     General:  Alert, oriented and cooperative. Patient is in no acute distress.  Skin:  Skin is warm and dry. No rash noted.   Cardiovascular: Normal heart rate noted  Respiratory: Normal respiratory effort, no problems with respiration noted  Abdomen: Soft, gravid, appropriate for gestational age. Pain/Pressure: Present     Pelvic:  Cervical exam deferred        Extremities: Normal range of motion.  Edema: None  Mental Status: Normal mood and affect. Normal behavior. Normal judgment and thought content.   Assessment   26 y.o. G3P2002 at [redacted]w[redacted]d by  11/23/2021, by Last Menstrual Period presenting for routine prenatal visit  Plan   pregnancy 3 Problems (from 04/12/21 to present)    Problem Noted Resolved   Supervision of high-risk pregnancy 04/12/2021 by Rod Can, CNM No   Overview Addendum 05/16/2021  9:51 PM by Malachy Mood, MD     Nursing Staff Provider  Office Location  [facility] Dating  LMP = 9 week [place]  Language  English Anatomy US    Flu Vaccine   Genetic Screen  NIPS: Normal XY  TDaP vaccine    Hgb A1C or  GTT Early : Third trimester :   Covid    LAB RESULTS   Rhogam   Blood Type --/--/A POS Performed at [facility], [address] [place], [place] [address]  [phone])   Feeding Plan  Antibody    Contraception  Rubella    Circumcision  RPR     Pediatrician   HBsAg     Support Person  HIV    Prenatal Classes  Varicella     GBS  (For PCN allergy, check sensitivities)   BTL Consent     VBAC Consent  Pap  2021 negative    Hgb Electro    Pelvis Tested  CF Negative     SMA Negative    Fragile-X Negative   Hx LEEP in 2019  Hx IOL x2 IUGR 2014/2017      Obesity affecting pregnancy 04/12/2021 by Rod Can, CNM No       Preterm labor symptoms and general obstetric precautions including but not limited to vaginal bleeding, contractions, leaking of fluid and fetal movement were reviewed in detail with the patient. Please refer to After Visit Summary for other counseling recommendations.   Return in about 3 weeks (around 09/05/2021)  for scheduled appointments.  Rod Can, CNM 08/15/2021 10:51 AM

## 2021-08-22 ENCOUNTER — Other Ambulatory Visit: Payer: Self-pay

## 2021-08-22 DIAGNOSIS — N871 Moderate cervical dysplasia: Secondary | ICD-10-CM

## 2021-08-22 DIAGNOSIS — R011 Cardiac murmur, unspecified: Secondary | ICD-10-CM

## 2021-08-22 DIAGNOSIS — O99212 Obesity complicating pregnancy, second trimester: Secondary | ICD-10-CM

## 2021-08-22 DIAGNOSIS — Z720 Tobacco use: Secondary | ICD-10-CM

## 2021-08-22 DIAGNOSIS — Q270 Congenital absence and hypoplasia of umbilical artery: Secondary | ICD-10-CM

## 2021-08-22 DIAGNOSIS — N200 Calculus of kidney: Secondary | ICD-10-CM

## 2021-08-22 DIAGNOSIS — O24410 Gestational diabetes mellitus in pregnancy, diet controlled: Secondary | ICD-10-CM

## 2021-08-22 DIAGNOSIS — Z9889 Other specified postprocedural states: Secondary | ICD-10-CM

## 2021-08-27 ENCOUNTER — Other Ambulatory Visit: Payer: Self-pay

## 2021-08-27 ENCOUNTER — Ambulatory Visit: Payer: Medicaid Other | Attending: Maternal & Fetal Medicine

## 2021-08-27 DIAGNOSIS — O99212 Obesity complicating pregnancy, second trimester: Secondary | ICD-10-CM

## 2021-08-27 DIAGNOSIS — O09292 Supervision of pregnancy with other poor reproductive or obstetric history, second trimester: Secondary | ICD-10-CM | POA: Diagnosis not present

## 2021-08-27 DIAGNOSIS — E668 Other obesity: Secondary | ICD-10-CM

## 2021-08-27 DIAGNOSIS — Z3A37 37 weeks gestation of pregnancy: Secondary | ICD-10-CM | POA: Diagnosis not present

## 2021-08-27 DIAGNOSIS — Z9889 Other specified postprocedural states: Secondary | ICD-10-CM

## 2021-08-27 DIAGNOSIS — N200 Calculus of kidney: Secondary | ICD-10-CM | POA: Diagnosis not present

## 2021-08-27 DIAGNOSIS — Z3A27 27 weeks gestation of pregnancy: Secondary | ICD-10-CM | POA: Diagnosis not present

## 2021-08-27 DIAGNOSIS — N871 Moderate cervical dysplasia: Secondary | ICD-10-CM

## 2021-08-27 DIAGNOSIS — Z720 Tobacco use: Secondary | ICD-10-CM

## 2021-08-27 DIAGNOSIS — R011 Cardiac murmur, unspecified: Secondary | ICD-10-CM

## 2021-08-27 DIAGNOSIS — Q270 Congenital absence and hypoplasia of umbilical artery: Secondary | ICD-10-CM

## 2021-08-27 DIAGNOSIS — O24410 Gestational diabetes mellitus in pregnancy, diet controlled: Secondary | ICD-10-CM | POA: Diagnosis not present

## 2021-08-28 ENCOUNTER — Encounter: Payer: Medicaid Other | Admitting: Obstetrics and Gynecology

## 2021-08-28 ENCOUNTER — Other Ambulatory Visit: Payer: Medicaid Other

## 2021-08-30 ENCOUNTER — Other Ambulatory Visit: Payer: Self-pay

## 2021-08-30 ENCOUNTER — Encounter: Payer: Self-pay | Admitting: Advanced Practice Midwife

## 2021-08-30 ENCOUNTER — Ambulatory Visit (INDEPENDENT_AMBULATORY_CARE_PROVIDER_SITE_OTHER): Payer: Medicaid Other | Admitting: Advanced Practice Midwife

## 2021-08-30 DIAGNOSIS — O0992 Supervision of high risk pregnancy, unspecified, second trimester: Secondary | ICD-10-CM

## 2021-08-30 DIAGNOSIS — Z3A27 27 weeks gestation of pregnancy: Secondary | ICD-10-CM

## 2021-08-30 DIAGNOSIS — O24410 Gestational diabetes mellitus in pregnancy, diet controlled: Secondary | ICD-10-CM

## 2021-08-30 NOTE — Progress Notes (Signed)
Routine Prenatal Care Visit- Virtual Visit  Subjective   Virtual Visit via Telephone Note  I connected with Brooke Aguirre on 08/30/21 at  3:00 PM EST by telephone and verified that I am speaking with the correct person using two identifiers.   I discussed the limitations, risks, security and privacy concerns of performing an evaluation and management service by telephone and the availability of in person appointments. I also discussed with the patient that there may be a patient responsible charge related to this service. The patient expressed understanding and agreed to proceed.  The patient was at home I spoke with the patient from my office phone The names of people involved in this encounter were: the patient Brooke Aguirre and myself Rod Can, CNM  Brooke Aguirre is a 26 y.o. [id number] at [redacted]w[redacted]d being seen today for ongoing prenatal care.  She is currently monitored for the following issues for this high-risk pregnancy and has Dysplasia of cervix, high grade CIN 2; Tobacco user; Status post LEEP (loop electrosurgical excision procedure) of cervix; Nephrolithiasis; Left nephrolithiasis; Heart murmur; Pyelonephritis; URI, acute; Supervision of high-risk pregnancy; Obesity affecting pregnancy; Contact with or exposure to varicella; Gestational diabetes mellitus (GDM), antepartum; Single umbilical artery; Obesity affecting pregnancy in second trimester; and Diet controlled gestational diabetes mellitus (GDM) in second trimester on their problem list.  ----------------------------------------------------------------------------------- Patient reports congestion and not feeling well for the past few days. She has tried robitussin without relief. Reviewed safe medications to treat symptoms. She reports ongoing sciatica pain. She has tried some comfort measures and still waiting to acquire abdominal support band. Suggested she try chiropractic. She reports her fasting blood sugar is always  below 95 and most after meal results are normal with occasional 127.   Contractions: Not present. Vag. Bleeding: None.  Movement: Present. Denies leaking of fluid.  ----------------------------------------------------------------------------------- The following portions of the patient's history were reviewed and updated as appropriate: allergies, current medications, past family history, past medical history, past social history, past surgical history and problem list. Problem list updated.   Objective  Last menstrual period 02/16/2021. Pregravid weight 220 lb (99.8 kg) Total Weight Gain -17 lb 8 oz (-7.938 kg) Urinalysis:      Fetal Status:     Movement: Present     Physical Exam could not be performed. Because of the COVID-19 outbreak this visit was performed over the phone and not in person.   Assessment   26 y.o. G3P2002 at [redacted]w[redacted]d by  11/23/2021, by Last Menstrual Period presenting for routine prenatal visit  Plan   pregnancy 3 Problems (from 04/12/21 to present)    Problem Noted Resolved   Supervision of high-risk pregnancy 04/12/2021 by Rod Can, CNM No   Overview Addendum 05/16/2021  9:51 PM by Malachy Mood, MD     Nursing Staff Provider  Office Location  [facility] Dating  LMP = 9 week [place]  Language  English Anatomy US    Flu Vaccine   Genetic Screen  NIPS: Normal XY  TDaP vaccine    Hgb A1C or  GTT Early : Third trimester :   Covid    LAB RESULTS   Rhogam   Blood Type --/--/A POS Performed at [facility], [address] [place], [place] [address]  [phone])   Feeding Plan  Antibody    Contraception  Rubella    Circumcision  RPR     Pediatrician   HBsAg     Support Person  HIV  Prenatal Classes  Varicella     GBS  (For PCN allergy, check sensitivities)   BTL Consent     VBAC Consent  Pap  2021 negative    Hgb Electro    Pelvis Tested  CF Negative     SMA Negative    Fragile-X Negative   Hx LEEP in 2019 Hx IOL x2 IUGR 2014/2017       Obesity affecting pregnancy 04/12/2021 by Rod Can, CNM No       Gestational age appropriate obstetric precautions including but not limited to vaginal bleeding, contractions, leaking of fluid and fetal movement were reviewed in detail with the patient.     Follow Up Instructions: Stay well hydrated Safe OTC meds for cold symptoms Check blood sugar/keep log/bring log to visits   I discussed the assessment and treatment plan with the patient. The patient was provided an opportunity to ask questions and all were answered. The patient agreed with the plan and demonstrated an understanding of the instructions.   The patient was advised to call back or seek an in-person evaluation if the symptoms worsen or if the condition fails to improve as anticipated.  I provided 15 minutes of non-face-to-face time during this encounter.  Return in about 2 weeks (around 09/13/2021) for rob with MD to review BS log/still needs other components of 28w labs.  Rod Can, [facility] 08/30/2021, 3:18 PM

## 2021-09-11 DIAGNOSIS — Z419 Encounter for procedure for purposes other than remedying health state, unspecified: Secondary | ICD-10-CM | POA: Diagnosis not present

## 2021-09-12 ENCOUNTER — Encounter: Payer: Self-pay | Admitting: Licensed Practical Nurse

## 2021-09-12 ENCOUNTER — Other Ambulatory Visit: Payer: Self-pay

## 2021-09-12 ENCOUNTER — Ambulatory Visit (INDEPENDENT_AMBULATORY_CARE_PROVIDER_SITE_OTHER): Payer: Medicaid Other | Admitting: Licensed Practical Nurse

## 2021-09-12 VITALS — BP 122/70 | Wt 200.0 lb

## 2021-09-12 DIAGNOSIS — O0990 Supervision of high risk pregnancy, unspecified, unspecified trimester: Secondary | ICD-10-CM

## 2021-09-12 DIAGNOSIS — M5432 Sciatica, left side: Secondary | ICD-10-CM

## 2021-09-12 DIAGNOSIS — M5431 Sciatica, right side: Secondary | ICD-10-CM

## 2021-09-12 DIAGNOSIS — Z3A29 29 weeks gestation of pregnancy: Secondary | ICD-10-CM

## 2021-09-12 DIAGNOSIS — O24410 Gestational diabetes mellitus in pregnancy, diet controlled: Secondary | ICD-10-CM

## 2021-09-12 NOTE — Progress Notes (Signed)
No vb. No lof.  

## 2021-09-12 NOTE — Progress Notes (Signed)
Routine Prenatal Care Visit ? ?Subjective  ?Brooke Aguirre is a 26 y.o. G3P2002 at [redacted]w[redacted]d being seen today for ongoing prenatal care.  She is currently monitored for the following issues for this high-risk pregnancy and has Dysplasia of cervix, high grade CIN 2; Tobacco user; Status post LEEP (loop electrosurgical excision procedure) of cervix; Nephrolithiasis; Left nephrolithiasis; Heart murmur; Pyelonephritis; URI, acute; Supervision of high-risk pregnancy; Obesity affecting pregnancy; Contact with or exposure to varicella; Gestational diabetes mellitus (GDM), antepartum; Single umbilical artery; Obesity affecting pregnancy in second trimester; and Diet controlled gestational diabetes mellitus (GDM) in second trimester on their problem list.  ?----------------------------------------------------------------------------------- ?Patient reports  sciatic pain .  Desires referral to chiro. Has lower abd cramping and back pain-rec belly binding.  ?Desires tubal in the event of a c/s.  Does want a PP tubal, may consider interval Tubal, Needs to sign tubal consent ?-Blood sugars well controlled, has decreased sugar intake, has a little carbs, fasting;s 67-90, after meals 103-116.  ?Contractions: Not present. Vag. Bleeding: None.  Movement: Present. Leaking Fluid denies.  ?----------------------------------------------------------------------------------- ?The following portions of the patient's history were reviewed and updated as appropriate: allergies, current medications, past family history, past medical history, past social history, past surgical history and problem list. Problem list updated. ? ?Objective  ?Blood pressure 122/70, weight 200 lb (90.7 kg), last menstrual period 02/16/2021. ?Pregravid weight 220 lb (99.8 kg) Total Weight Gain -20 lb (-9.072 kg) ?Urinalysis: Urine Protein    Urine Glucose   ? ?Fetal Status: Fetal Heart Rate (bpm): 145 Fundal Height: 31 cm Movement: Present    ? ?General:  Alert,  oriented and cooperative. Patient is in no acute distress.  ?Skin: Skin is warm and dry. No rash noted.   ?Cardiovascular: Normal heart rate noted  ?Respiratory: Normal respiratory effort, no problems with respiration noted  ?Abdomen: Soft, gravid, appropriate for gestational age. Pain/Pressure: Absent     ?Pelvic:  Cervical exam deferred        ?Extremities: Normal range of motion.     ?Mental Status: Normal mood and affect. Normal behavior. Normal judgment and thought content.  ? ?Assessment  ? ?26 y.o. G3P2002 at [redacted]w[redacted]d by  11/23/2021, by Last Menstrual Period presenting for routine prenatal visit ? ?Plan  ? ?pregnancy 3 Problems (from 04/12/21 to present)   ? ? Problem Noted Resolved  ? Supervision of high-risk pregnancy 04/12/2021 by Rod Can, CNM No  ? Overview Addendum 05/16/2021  9:51 PM by Malachy Mood, MD  ?   ?Nursing Staff Provider  ?Office Location  [facility] Dating  LMP = 9 week [place]  ?Language  English Anatomy US    ?Flu Vaccine   Genetic Screen  NIPS: Normal XY  ?TDaP vaccine    Hgb A1C or  ?GTT Early : ?Third trimester :   ?Covid    LAB RESULTS   ?Rhogam   Blood Type --/--/A POS ?Performed at [facility], [address] [place], [place] [address] ? (09/13 2114)   ?Feeding Plan  Antibody    ?Contraception  Rubella    ?Circumcision  RPR     ?Pediatrician   HBsAg     ?Support Person  HIV    ?Prenatal Classes  Varicella   ?  GBS  (For PCN allergy, check sensitivities)   ?BTL Consent     ?VBAC Consent  Pap  2021 negative  ?  Hgb Electro    ?Pelvis Tested  CF Negative  ?   SMA Negative  ?  Fragile-X Negative  ? ?Hx LEEP in 2019 ?Hx IOL x2 IUGR 2014/2017 ?  ?  ? Obesity affecting pregnancy 04/12/2021 by Rod Can, CNM No  ? ?  ?  ? ?Preterm labor symptoms and general obstetric precautions including but not limited to vaginal bleeding, contractions, leaking of fluid and fetal movement were reviewed in detail with the patient. ?Please refer to After Visit Summary for other counseling  recommendations.  ? ?Return in about 2 weeks (around 09/26/2021) for [place]. ? ?Needs to sign tubal consent ? ?Roberto Scales, CNM  ?Mosetta Pigeon, [facility]  ?09/12/21  ?2:31 PM  ? ? ? ?

## 2021-09-18 ENCOUNTER — Telehealth: Payer: Self-pay | Admitting: Family Medicine

## 2021-09-18 NOTE — Telephone Encounter (Signed)
.. ?  Medicaid Managed Care  ? ?Unsuccessful Outreach Note ? ?09/18/2021 ?Name: Brooke Aguirre MRN: [id number] DOB: December 16, 1995 ? ?Referred by: [facility] ?Reason for referral : High Risk Managed Medicaid (I called the patient today to get her scheduled with the MM Team. I left my name and number on her VM.) ? ? ?An unsuccessful telephone outreach was attempted today. The patient was referred to the case management team for assistance with care management and care coordination.  ? ?Follow Up Plan: The care management team will reach out to the patient again over the next 14 days.  ? ?Weston Settle ?Care Guide, High Risk Medicaid Managed Care ?Embedded Care Coordination ?[place]  [facility]  ? ? ? ?

## 2021-09-20 DIAGNOSIS — O09893 Supervision of other high risk pregnancies, third trimester: Secondary | ICD-10-CM | POA: Insufficient documentation

## 2021-09-23 ENCOUNTER — Other Ambulatory Visit: Payer: Self-pay | Admitting: Obstetrics and Gynecology

## 2021-09-23 ENCOUNTER — Other Ambulatory Visit: Payer: Self-pay

## 2021-09-23 NOTE — Patient Outreach (Signed)
?Medicaid Managed Care   ?Nurse Care Manager Note ? ?09/23/2021 ?Name:  Brooke Aguirre MRN:  [id number] DOB:  1995/12/01 ? ?Brooke Aguirre is an 26 y.o. year old female who is a primary patient of [facility].  The [facility] Managed Care Coordination team was consulted for assistance with:    ?Obstetrics healthcare management needs ? ?Brooke Aguirre was given information about Medicaid Managed Care Coordination team services today. Brooke Aguirre Patient agreed to services and verbal consent obtained. ? ?Engaged with patient by telephone for initial visit in response to provider referral for case management and/or care coordination services.  ? ?Assessments/Interventions:  Review of past medical history, allergies, medications, health status, including review of consultants reports, laboratory and other test data, was performed as part of comprehensive evaluation and provision of chronic care management services. ? ?SDOH (Social Determinants of Health) assessments and interventions performed: ?SDOH Interventions   ? ?Flowsheet Row Most Recent Value  ?SDOH Interventions   ?Food Insecurity Interventions Intervention Not Indicated  ? ?  ? ? ?Care Plan ? ?Allergies  ?Allergen Reactions  ? Kiwi Extract Anaphylaxis  ? Amoxicillin Hives, Rash and Other (See Comments)  ?  Blisters ?Has patient had a PCN reaction causing immediate rash, facial/tongue/throat swelling, SOB or lightheadedness with hypotension: No ?Has patient had a PCN reaction causing severe rash involving mucus membranes or skin necrosis: Yes ?Has patient had a PCN reaction that required hospitalization: No ?Has patient had a PCN reaction occurring within the last 10 years: Unknown ?If all of the above answers are "NO", then may proceed with Cephalosporin use. ?  ? Ciprofloxacin Hives  ?  blisters  ? Penicillins Rash and Other (See Comments)  ?  Has patient had a PCN reaction causing immediate rash, facial/tongue/throat swelling, SOB or  lightheadedness with hypotension:no ?Has patient had a PCN reaction causing severe rash involving mucus membranes or skin necrosis: yes ?Has patient had a PCN reaction that required hospitalization no ?Has patient had a PCN reaction occurring within the last 10 years: yes ?If all of the above answers are "NO", then may proceed with Cephalosporin use. ?  ? Tramadol Itching and Hives  ? Cranberry Rash  ? Latex Rash and Hives  ? Sulfa Antibiotics Rash and Other (See Comments)  ?  BLISTERS  ? ? ?Medications Reviewed Today   ? ? Reviewed by Brooke Medicus, RN (Registered Nurse) on 09/23/21 at [place] List Status: <None>  ? ?Medication Order Taking? Sig Documenting Provider Last Dose Status Informant  ?Accu-Chek Softclix Lancets lancets [id number] Yes 1 each by Other route 4 (four) times daily. Use as instructed Malachy Mood, MD Taking Active   ?Blood Glucose Monitoring Suppl (ACCU-CHEK GUIDE) w/Device KIT [id number] Yes 1 kit by Does not apply route as directed. Malachy Mood, MD Taking Active   ?glucose blood (ACCU-CHEK GUIDE) test strip [id number] Yes Use as instructed Malachy Mood, MD Taking Active   ?ondansetron (ZOFRAN ODT) 4 MG disintegrating tablet [id number] Yes Take 1 tablet (4 mg total) by mouth every 8 (eight) hours as needed. Menshew, Dannielle Karvonen, PA-C Taking Active   ?polyethylene glycol powder (MIRALAX) 17 GM/SCOOP powder [id number] Yes Take 9 g by mouth daily.  ?Patient taking differently: Take 9 g by mouth daily. Takes as needed  ? Naaman Plummer, MD Taking Active Self  ?Prenatal Vit-Fe Fumarate-FA (MULTIVITAMIN-PRENATAL) 27-0.8 MG TABS tablet [id number] Yes Take 1 tablet by mouth daily at 12 noon. [provider] Taking Active   ?  Discontinued 11/13/20 0527   ? ?  ?  ? ?  ? ? ?Patient Active Problem List  ? Diagnosis Date Noted  ? Single umbilical artery 17/79/3903  ? Obesity affecting pregnancy in second trimester 06/26/2021  ? Diet controlled gestational diabetes mellitus (GDM)  in second trimester 06/26/2021  ? Gestational diabetes mellitus (GDM), antepartum 05/22/2021  ? Contact with or exposure to varicella 05/07/2021  ? Supervision of high-risk pregnancy 04/12/2021  ? Obesity affecting pregnancy 04/12/2021  ? Left nephrolithiasis 03/30/2019  ? Nephrolithiasis 03/29/2019  ? Status post LEEP (loop electrosurgical excision procedure) of cervix 05/03/2018  ? 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 Tobacco user 02/25/2018  ? Heart murmur 05/03/2012  ? URI, acute 05/03/2012  ? Pyelonephritis 12/02/2011  ? ? ?Conditions to be addressed/monitored per PCP order:   obstetric healthcare management needs, GDM, 1 vessel cord ? ?Care Plan : RN Care Manager Plan of Care  ?Updates made by Brooke Medicus, RN since 09/23/2021 12:00 AM  ?  ? ?Problem: High Risk Pregnancy   ?Priority: High  ?  ? ?Goal: Self-Management Plan Developed   ?Start Date: 09/23/2021  ?Expected End Date: 12/24/2021  ?This Visit's Progress: On track  ?Priority: High  ?Note:   ?Current Barriers:  ?31.3 week pregnancy with GDM and 1 vessel cord ? ?RNCM Clinical Goal(s):  ?Patient will verbalize understanding of plan for management of GDM as evidenced by patient report ?verbalize basic understanding of  GDM disease process and self health management plan as evidenced by patient report ?take all medications exactly as prescribed and will call provider for medication related questions as evidenced by patient report ?attend all scheduled medical appointments as evidenced by patient report ?continue to work with RN Care Manager to address care management and care coordination needs related to  GDM as evidenced by adherence to CM Team Scheduled appointments through collaboration with RN Care manager, provider, and care team.  ? ?Interventions: ?Inter-disciplinary care team collaboration (see longitudinal plan of care) ?Evaluation of current treatment plan related to  self management and patient's adherence to plan as established by  provider ? ?Gestational Diabetes ?Placental Abnormality  ?Recommended adequate rest     and Reviewed prescribed diet and encouraged optimizing nutrition during pregnancy      ? ?Patient Goals/Self-Care Activities: ?Take all medications as prescribed ?Attend all scheduled provider appointments ?Call pharmacy for medication refills 3-7 days in advance of running out of medications ?Perform all self care activities independently  ?Perform IADL's (shopping, preparing meals, housekeeping, managing finances) independently ?Call provider office for new concerns or questions  ? ?Follow Up Plan:  The patient has been provided with contact information for the care management team and has been advised to call with any health related questions or concerns.  ?The care management team will reach out to the patient again over the next 30 days.  ?  ? ?Goal: Establish Plan of Care for High Risk Pregnancy   ?Start Date: 09/23/2021  ?Expected End Date: 12/24/2021  ?Priority: High  ?Note:   ?Timeframe:  Short-Term Goal ?Priority:  High ?Start Date:       09/23/21                      ?Expected End Date:  ongoing                    ? ?Follow Up Date 10/24/21  ?  ?- schedule appointment for flu shot ?-  schedule appointment for vaccines needed due to my age or health ?- schedule recommended health tests (blood work, mammogram, colonoscopy, pap test) ?- schedule and keep appointment for annual check-up  ?  ?Why is this important?   ?Screening tests can find diseases early when they are easier to treat.  ?Your doctor or nurse will talk with you about which tests are important for you.  ?Getting shots for common diseases like the flu and shingles will help prevent them.   ?  ? ?Follow Up:  Patient agrees to Care Plan and Follow-up. ? ?Plan: The Managed Medicaid care management team will reach out to the patient again over the next 30 days. and The  Patient has been provided with contact information for the Managed Medicaid care management team  and has been advised to call with any health related questions or concerns. ? ?Date/time of next scheduled RN care management/care coordination outreach: 10/24/21 at 330 ? ?

## 2021-09-23 NOTE — Patient Instructions (Signed)
Hi Ms. Sayed, thank you for speaking with me today-have a great afternoon. ? ?Ms. Guin was given information about Medicaid Managed Care team care coordination services as a part of their [facility] Medicaid benefit. Murlean Hark verbally consented to engagement with the [facility] Managed Care team.  ? ?If you are experiencing a medical emergency, please call 911 or report to your local emergency department or urgent care.  ? ?If you have a non-emergency medical problem during routine business hours, please contact your provider's office and ask to speak with a nurse.  ? ?For questions related to your [facility] health plan, please call: [phone] or go here:[email or web address] ? ?If you would like to schedule transportation through your [facility] plan, please call the following number at least 2 days in advance of your appointment: [phone]. ? You can also use the MTM portal or MTM mobile app to manage your rides. For the portal, please go to [email or web address] ? ?Call the Behavioral Health Crisis Line at [phone], at any time, 24 hours a day, 7 days a week. If you are in danger or need immediate medical attention call 911. ? ?If you would like help to quit smoking, call 1-800-QUIT-NOW (270-855-7461) OR Espa?ol: 1-855-D?jelo-Ya (548)518-3165) o para m?s informaci?n haga clic aqu? or Text READY to 200-400 to register via text ? ?Ms. Cooperman - following are the goals we discussed in your visit today:  ? Goals Addressed   ? ? ?Goal: Establish Plan of Care for High Risk Pregnancy   ?Start Date: 09/23/2021  ?Expected End Date: 12/24/2021  ?Priority: High  ?Note:   ?Timeframe:  Short-Term Goal ?Priority:  High ?Start Date:       09/23/21                      ?Expected End Date:  ongoing                    ? ?Follow Up Date 10/24/21  ?  ?- schedule appointment for flu shot ?- schedule appointment for vaccines needed due to my age or health ?- schedule recommended health tests  (blood work, mammogram, colonoscopy, pap test) ?- schedule and keep appointment for annual check-up  ?  ?Why is this important?   ?Screening tests can find diseases early when they are easier to treat.  ?Your doctor or nurse will talk with you about which tests are important for you.  ?Getting shots for common diseases like the flu and shingles will help prevent them.   ? ?Patient verbalizes understanding of instructions and care plan provided today and agrees to view in MyChart. Active MyChart status confirmed with patient.   ? ?The Managed Medicaid care management team will reach out to the patient again over the next 30 days.  ?The  Patient  has been provided with contact information for the Managed Medicaid care management team and has been advised to call with any health related questions or concerns.  ? ?Kathi Der RN, BSN ?[place]  [facility] ?Care Management Coordinator - Managed Medicaid High Risk ?[phone] ? ?Following is a copy of your plan of care:  ?Care Plan : RN Care Manager Plan of Care  ?Updates made by Danie Chandler, RN since 09/23/2021 12:00 AM  ?  ? ?Problem: High Risk Pregnancy   ?Priority: High  ?  ? ?Goal: Self-Management Plan Developed   ?Start Date: 09/23/2021  ?Expected End Date: 12/24/2021  ?This Visit's Progress: On track  ?  Priority: High  ?Note:   ?Current Barriers:  ?31.3 week pregnancy with GDM and 1 vessel cord ? ?RNCM Clinical Goal(s):  ?Patient will verbalize understanding of plan for management of GDM as evidenced by patient report ?verbalize basic understanding of  GDM disease process and self health management plan as evidenced by patient report ?take all medications exactly as prescribed and will call provider for medication related questions as evidenced by patient report ?attend all scheduled medical appointments as evidenced by patient report ?continue to work with RN Care Manager to address care management and care coordination needs related to  GDM as  evidenced by adherence to CM Team Scheduled appointments through collaboration with RN Care manager, provider, and care team.  ? ?Interventions: ?Inter-disciplinary care team collaboration (see longitudinal plan of care) ?Evaluation of current treatment plan related to  self management and patient's adherence to plan as established by provider ? ?Gestational Diabetes ?Placental Abnormality  ?Recommended adequate rest     and Reviewed prescribed diet and encouraged optimizing nutrition during pregnancy      ? ?Patient Goals/Self-Care Activities: ?Take all medications as prescribed ?Attend all scheduled provider appointments ?Call pharmacy for medication refills 3-7 days in advance of running out of medications ?Perform all self care activities independently  ?Perform IADL's (shopping, preparing meals, housekeeping, managing finances) independently ?Call provider office for new concerns or questions  ? ?Follow Up Plan:  The patient has been provided with contact information for the care management team and has been advised to call with any health related questions or concerns.  ?The care management team will reach out to the patient again over the next 30 days.  ? ?  ?

## 2021-09-25 DIAGNOSIS — M62830 Muscle spasm of back: Secondary | ICD-10-CM | POA: Diagnosis not present

## 2021-09-25 DIAGNOSIS — O09893 Supervision of other high risk pregnancies, third trimester: Secondary | ICD-10-CM | POA: Diagnosis not present

## 2021-09-25 DIAGNOSIS — M9905 Segmental and somatic dysfunction of pelvic region: Secondary | ICD-10-CM | POA: Diagnosis not present

## 2021-09-25 DIAGNOSIS — M9903 Segmental and somatic dysfunction of lumbar region: Secondary | ICD-10-CM | POA: Diagnosis not present

## 2021-09-25 DIAGNOSIS — M5431 Sciatica, right side: Secondary | ICD-10-CM | POA: Diagnosis not present

## 2021-09-25 DIAGNOSIS — M9902 Segmental and somatic dysfunction of thoracic region: Secondary | ICD-10-CM | POA: Diagnosis not present

## 2021-09-25 DIAGNOSIS — M955 Acquired deformity of pelvis: Secondary | ICD-10-CM | POA: Diagnosis not present

## 2021-09-26 ENCOUNTER — Encounter: Payer: Medicaid Other | Admitting: Obstetrics and Gynecology

## 2021-09-28 DIAGNOSIS — M955 Acquired deformity of pelvis: Secondary | ICD-10-CM | POA: Diagnosis not present

## 2021-09-28 DIAGNOSIS — M9902 Segmental and somatic dysfunction of thoracic region: Secondary | ICD-10-CM | POA: Diagnosis not present

## 2021-09-28 DIAGNOSIS — M5431 Sciatica, right side: Secondary | ICD-10-CM | POA: Diagnosis not present

## 2021-09-28 DIAGNOSIS — M9903 Segmental and somatic dysfunction of lumbar region: Secondary | ICD-10-CM | POA: Diagnosis not present

## 2021-09-28 DIAGNOSIS — M62830 Muscle spasm of back: Secondary | ICD-10-CM | POA: Diagnosis not present

## 2021-09-28 DIAGNOSIS — M9905 Segmental and somatic dysfunction of pelvic region: Secondary | ICD-10-CM | POA: Diagnosis not present

## 2021-10-01 ENCOUNTER — Ambulatory Visit: Payer: Medicaid Other | Attending: Obstetrics and Gynecology

## 2021-10-01 ENCOUNTER — Other Ambulatory Visit: Payer: Self-pay

## 2021-10-01 VITALS — BP 130/76 | HR 102 | Temp 97.5°F | Ht 61.0 in | Wt 199.5 lb

## 2021-10-01 DIAGNOSIS — Z3A32 32 weeks gestation of pregnancy: Secondary | ICD-10-CM | POA: Insufficient documentation

## 2021-10-01 DIAGNOSIS — O24410 Gestational diabetes mellitus in pregnancy, diet controlled: Secondary | ICD-10-CM | POA: Insufficient documentation

## 2021-10-01 DIAGNOSIS — O09293 Supervision of pregnancy with other poor reproductive or obstetric history, third trimester: Secondary | ICD-10-CM | POA: Diagnosis not present

## 2021-10-01 DIAGNOSIS — O0991 Supervision of high risk pregnancy, unspecified, first trimester: Secondary | ICD-10-CM

## 2021-10-01 DIAGNOSIS — O99213 Obesity complicating pregnancy, third trimester: Secondary | ICD-10-CM | POA: Diagnosis not present

## 2021-10-01 DIAGNOSIS — O0993 Supervision of high risk pregnancy, unspecified, third trimester: Secondary | ICD-10-CM | POA: Diagnosis not present

## 2021-10-01 DIAGNOSIS — Z369 Encounter for antenatal screening, unspecified: Secondary | ICD-10-CM | POA: Diagnosis not present

## 2021-10-01 DIAGNOSIS — O99212 Obesity complicating pregnancy, second trimester: Secondary | ICD-10-CM

## 2021-10-02 DIAGNOSIS — M9905 Segmental and somatic dysfunction of pelvic region: Secondary | ICD-10-CM | POA: Diagnosis not present

## 2021-10-02 DIAGNOSIS — M9903 Segmental and somatic dysfunction of lumbar region: Secondary | ICD-10-CM | POA: Diagnosis not present

## 2021-10-02 DIAGNOSIS — M955 Acquired deformity of pelvis: Secondary | ICD-10-CM | POA: Diagnosis not present

## 2021-10-02 DIAGNOSIS — M9902 Segmental and somatic dysfunction of thoracic region: Secondary | ICD-10-CM | POA: Diagnosis not present

## 2021-10-02 DIAGNOSIS — M5431 Sciatica, right side: Secondary | ICD-10-CM | POA: Diagnosis not present

## 2021-10-02 DIAGNOSIS — M62830 Muscle spasm of back: Secondary | ICD-10-CM | POA: Diagnosis not present

## 2021-10-03 DIAGNOSIS — M9905 Segmental and somatic dysfunction of pelvic region: Secondary | ICD-10-CM | POA: Diagnosis not present

## 2021-10-03 DIAGNOSIS — M5431 Sciatica, right side: Secondary | ICD-10-CM | POA: Diagnosis not present

## 2021-10-03 DIAGNOSIS — M62830 Muscle spasm of back: Secondary | ICD-10-CM | POA: Diagnosis not present

## 2021-10-03 DIAGNOSIS — M9903 Segmental and somatic dysfunction of lumbar region: Secondary | ICD-10-CM | POA: Diagnosis not present

## 2021-10-03 DIAGNOSIS — M955 Acquired deformity of pelvis: Secondary | ICD-10-CM | POA: Diagnosis not present

## 2021-10-03 DIAGNOSIS — M9902 Segmental and somatic dysfunction of thoracic region: Secondary | ICD-10-CM | POA: Diagnosis not present

## 2021-10-04 DIAGNOSIS — M62830 Muscle spasm of back: Secondary | ICD-10-CM | POA: Diagnosis not present

## 2021-10-04 DIAGNOSIS — M955 Acquired deformity of pelvis: Secondary | ICD-10-CM | POA: Diagnosis not present

## 2021-10-04 DIAGNOSIS — M9903 Segmental and somatic dysfunction of lumbar region: Secondary | ICD-10-CM | POA: Diagnosis not present

## 2021-10-04 DIAGNOSIS — M9902 Segmental and somatic dysfunction of thoracic region: Secondary | ICD-10-CM | POA: Diagnosis not present

## 2021-10-04 DIAGNOSIS — M5431 Sciatica, right side: Secondary | ICD-10-CM | POA: Diagnosis not present

## 2021-10-04 DIAGNOSIS — M9905 Segmental and somatic dysfunction of pelvic region: Secondary | ICD-10-CM | POA: Diagnosis not present

## 2021-10-07 DIAGNOSIS — M9905 Segmental and somatic dysfunction of pelvic region: Secondary | ICD-10-CM | POA: Diagnosis not present

## 2021-10-07 DIAGNOSIS — M955 Acquired deformity of pelvis: Secondary | ICD-10-CM | POA: Diagnosis not present

## 2021-10-07 DIAGNOSIS — M62830 Muscle spasm of back: Secondary | ICD-10-CM | POA: Diagnosis not present

## 2021-10-07 DIAGNOSIS — M9902 Segmental and somatic dysfunction of thoracic region: Secondary | ICD-10-CM | POA: Diagnosis not present

## 2021-10-07 DIAGNOSIS — M5431 Sciatica, right side: Secondary | ICD-10-CM | POA: Diagnosis not present

## 2021-10-07 DIAGNOSIS — M9903 Segmental and somatic dysfunction of lumbar region: Secondary | ICD-10-CM | POA: Diagnosis not present

## 2021-10-09 ENCOUNTER — Encounter: Payer: Self-pay | Admitting: Obstetrics and Gynecology

## 2021-10-09 ENCOUNTER — Observation Stay
Admission: EM | Admit: 2021-10-09 | Discharge: 2021-10-09 | Disposition: A | Discharge status: Home / Self Care | Payer: Medicaid Other | Attending: Obstetrics and Gynecology | Admitting: Obstetrics and Gynecology

## 2021-10-09 ENCOUNTER — Other Ambulatory Visit: Payer: Self-pay

## 2021-10-09 DIAGNOSIS — M9903 Segmental and somatic dysfunction of lumbar region: Secondary | ICD-10-CM | POA: Diagnosis not present

## 2021-10-09 DIAGNOSIS — Z6841 Body Mass Index (BMI) 40.0 and over, adult: Secondary | ICD-10-CM | POA: Insufficient documentation

## 2021-10-09 DIAGNOSIS — O24419 Gestational diabetes mellitus in pregnancy, unspecified control: Secondary | ICD-10-CM | POA: Diagnosis not present

## 2021-10-09 DIAGNOSIS — Z3A33 33 weeks gestation of pregnancy: Secondary | ICD-10-CM | POA: Insufficient documentation

## 2021-10-09 DIAGNOSIS — Z91040 Latex allergy status: Secondary | ICD-10-CM | POA: Diagnosis not present

## 2021-10-09 DIAGNOSIS — F17210 Nicotine dependence, cigarettes, uncomplicated: Secondary | ICD-10-CM | POA: Diagnosis not present

## 2021-10-09 DIAGNOSIS — E6609 Other obesity due to excess calories: Secondary | ICD-10-CM | POA: Diagnosis not present

## 2021-10-09 DIAGNOSIS — M5431 Sciatica, right side: Secondary | ICD-10-CM | POA: Diagnosis not present

## 2021-10-09 DIAGNOSIS — O99212 Obesity complicating pregnancy, second trimester: Secondary | ICD-10-CM | POA: Insufficient documentation

## 2021-10-09 DIAGNOSIS — M9905 Segmental and somatic dysfunction of pelvic region: Secondary | ICD-10-CM | POA: Diagnosis not present

## 2021-10-09 DIAGNOSIS — N189 Chronic kidney disease, unspecified: Secondary | ICD-10-CM | POA: Diagnosis not present

## 2021-10-09 DIAGNOSIS — O368190 Decreased fetal movements, unspecified trimester, not applicable or unspecified: Secondary | ICD-10-CM | POA: Diagnosis present

## 2021-10-09 DIAGNOSIS — O368130 Decreased fetal movements, third trimester, not applicable or unspecified: Secondary | ICD-10-CM | POA: Diagnosis not present

## 2021-10-09 DIAGNOSIS — O99333 Smoking (tobacco) complicating pregnancy, third trimester: Secondary | ICD-10-CM | POA: Diagnosis not present

## 2021-10-09 DIAGNOSIS — O0993 Supervision of high risk pregnancy, unspecified, third trimester: Secondary | ICD-10-CM | POA: Diagnosis not present

## 2021-10-09 DIAGNOSIS — M62830 Muscle spasm of back: Secondary | ICD-10-CM | POA: Diagnosis not present

## 2021-10-09 DIAGNOSIS — E039 Hypothyroidism, unspecified: Secondary | ICD-10-CM | POA: Insufficient documentation

## 2021-10-09 DIAGNOSIS — M9902 Segmental and somatic dysfunction of thoracic region: Secondary | ICD-10-CM | POA: Diagnosis not present

## 2021-10-09 DIAGNOSIS — M955 Acquired deformity of pelvis: Secondary | ICD-10-CM | POA: Diagnosis not present

## 2021-10-09 DIAGNOSIS — O0992 Supervision of high risk pregnancy, unspecified, second trimester: Secondary | ICD-10-CM

## 2021-10-09 MED ORDER — CALCIUM CARBONATE ANTACID 500 MG PO CHEW: 2.0000 | CHEWABLE_TABLET | ORAL | Status: DC | PRN

## 2021-10-09 MED ORDER — ACETAMINOPHEN 325 MG PO TABS: 650.0000 mg | ORAL_TABLET | ORAL | Status: DC | PRN

## 2021-10-09 MED ORDER — ZOLPIDEM TARTRATE 5 MG PO TABS: 5.0000 mg | ORAL_TABLET | Freq: Every evening | ORAL | Status: DC | PRN

## 2021-10-09 MED ORDER — DOCUSATE SODIUM 100 MG PO CAPS: 100.0000 mg | ORAL_CAPSULE | Freq: Every day | ORAL | Status: DC

## 2021-10-09 NOTE — OB Triage Note (Signed)
Pt Brooke Aguirre 26 y.o. presents to labor and delivery triage reporting DFM and pelvic pressure. Pt is a G3P2002 at [redacted]w[redacted]d . Pt denies signs and symptoms consistent with rupture of membranes or active vaginal bleeding. Pt denies contractions and states positive fetal movement. External FM and TOCO applied to non-tender abdomen and assessing. Initial FHR 140. Vital signs obtained and within normal limits. Provider notified of pt. ? ?

## 2021-10-09 NOTE — OB Triage Note (Signed)
Pt discharged home per order.   Pt stable and ambulatory and an After Visit Summary was printed and given to the patient. Discharge education completed with patient/family including follow up instructions, appointments, and medication list. Pt received labor and bleeding precautions. Patient able to verbalize understanding, all questions fully answered upon discharge. Patient instructed to return to ED, call 911, or call MD for any changes in condition. Pt discharged home via personal vehicle with support person.   

## 2021-10-09 NOTE — Discharge Summary (Signed)
Brooke Aguirre is a 26 y.o. female. She is at 79w4dgestation. Patient's last menstrual period was 02/16/2021 (exact date). ?Estimated Date of Delivery: 11/23/21 ? ?[facility] ? ?Chief complaint: decreased FM ? ?HPI: JMarissahpresents to L&D with complaints of decreased FM since 11 am this morning  ? ?Factors complicating pregnancy: ?Obesity BMI 44.64 ?GDM A1 ?Smoker ?Rubella/varicella non- immune ?Hx abn pap and leep 2019 ?Single umbilical artery ?Kidney stones 2020 ?Hx IUGR x2, 2014/2017 ?Desires BTL ? ?S: Resting comfortably. no CTX, no VB.no LOF,  Active fetal movement.  ? ?Maternal Medical History:  ?Past Medical Hx:  has a past medical history of Abnormal Pap smear of cervix, Chronic kidney disease, Family history of adverse reaction to anesthesia, Gestational diabetes, Heart murmur, History of lithotripsy, HPV in female, Hypothyroidism, Kidney stone, and Thyroid goiter.   ? ?Past Surgical Hx:  has a past surgical history that includes Removal of drug delivery implant (Left, 12/15/2014); LEEP (N/A, 05/03/2018); Extracorporeal shock wave lithotripsy (Left, 04/14/2019); and Extracorporeal shock wave lithotripsy (Right, 05/05/2019).  ? ?Allergies  ?Allergen Reactions  ? Kiwi Extract Anaphylaxis  ? Amoxicillin Hives, Rash and Other (See Comments)  ?  Blisters ?Has patient had a PCN reaction causing immediate rash, facial/tongue/throat swelling, SOB or lightheadedness with hypotension: No ?Has patient had a PCN reaction causing severe rash involving mucus membranes or skin necrosis: Yes ?Has patient had a PCN reaction that required hospitalization: No ?Has patient had a PCN reaction occurring within the last 10 years: Unknown ?If all of the above answers are "NO", then may proceed with Cephalosporin use. ?  ? Ciprofloxacin Hives  ?  blisters  ? Penicillins Rash and Other (See Comments)  ?  Has patient had a PCN reaction causing immediate rash, facial/tongue/throat swelling, SOB or  lightheadedness with hypotension:no ?Has patient had a PCN reaction causing severe rash involving mucus membranes or skin necrosis: yes ?Has patient had a PCN reaction that required hospitalization no ?Has patient had a PCN reaction occurring within the last 10 years: yes ?If all of the above answers are "NO", then may proceed with Cephalosporin use. ?  ? Tramadol Itching and Hives  ? Cranberry Rash  ? Latex Rash and Hives  ? Sulfa Antibiotics Rash and Other (See Comments)  ?  BLISTERS  ?  ? ?Prior to Admission medications   ?Medication Sig Start Date End Date Taking? Authorizing Provider  ?Accu-Chek Softclix Lancets lancets 1 each by Other route 4 (four) times daily. Use as instructed 05/22/21   SMalachy Mood MD  ?Blood Glucose Monitoring Suppl (ACCU-CHEK GUIDE) w/Device KIT 1 kit by Does not apply route as directed. 05/22/21   SMalachy Mood MD  ?glucose blood (ACCU-CHEK GUIDE) test strip Use as instructed 05/22/21   SMalachy Mood MD  ?ondansetron (ZOFRAN ODT) 4 MG disintegrating tablet Take 1 tablet (4 mg total) by mouth every 8 (eight) hours as needed. 05/16/21   Menshew, JDannielle Karvonen PA-C  ?polyethylene glycol powder (MIRALAX) 17 GM/SCOOP powder Take 9 g by mouth daily. ?Patient taking differently: Take 9 g by mouth daily. Takes as needed 05/30/21   BNaaman Plummer MD  ?Prenatal Vit-Fe Fumarate-FA (MULTIVITAMIN-PRENATAL) 27-0.8 MG TABS tablet Take 1 tablet by mouth daily at 12 noon.    [provider]  ?promethazine (PHENERGAN) 25 MG tablet Take 1 tablet (25 mg total) by mouth every 8 (eight) hours as needed for nausea or vomiting. 10/18/20 11/13/20  Couture, Cortni S, PA-C  ? ? ?  Social History: She  reports that she has been smoking cigarettes. She has a 5.00 pack-year smoking history. She has never used smokeless tobacco. She reports that she does not currently use alcohol. She reports that she does not use drugs. ? ?Family History: family history includes Diabetes in her maternal  grandmother and mother; Hepatitis C in her father and mother. ,no history of gyn cancers ? ?Review of Systems: A full review of systems was performed and negative except as noted in the HPI.   ? ?O: ? BP 106/64 (BP Location: Right Arm)   Pulse 95   Resp 18   Ht _0  (1.499 m)   Wt 90.3 kg   LMP 02/16/2021 (Exact Date)   BMI 40.19 kg/m?  ?No results found for this or any previous visit (from the past 48 hour(s)).  ? ?Constitutional: NAD, AAOx3  ?HE/ENT: extraocular movements grossly intact, moist mucous membranes ?CV: RRR ?PULM: nl respiratory effort ?Abd: gravid, non-tender, non-distended, soft  ?Ext: Non-tender, Nonedmeatous ?Psych: mood appropriate, speech normal ?Pelvic : deferred ?SVE:    ? ?Fetal Monitor: ?Baseline: 125 bpm ?Variability: moderate ?Accels: Present ?Decels: none ?Toco: none ? ?Category: I ?NST: reactive ? ? ?Assessment: 26 y.o. 69w4dhere for antenatal surveillance during pregnancy. ? ?Principle diagnosis: Decreased fetal movement [O36.8190]  ? ?Plan: ?Labor: not present.  ?NST reviewed  ?Fetal Wellbeing: Reassuring Cat 1 tracing. ?Reactive NST  ?D/c home stable, precautions reviewed, follow-up as scheduled.  ? ?----- ?FAvelino LeedsCNM ?Certified Nurse Midwife ?[facility] ?  ?

## 2021-10-10 ENCOUNTER — Observation Stay: Payer: Medicaid Other

## 2021-10-10 ENCOUNTER — Observation Stay
Admission: EM | Admit: 2021-10-10 | Discharge: 2021-10-10 | Disposition: A | Discharge status: Home / Self Care | Payer: Medicaid Other | Attending: Obstetrics and Gynecology | Admitting: Obstetrics and Gynecology

## 2021-10-10 ENCOUNTER — Encounter: Payer: Self-pay | Admitting: Obstetrics and Gynecology

## 2021-10-10 DIAGNOSIS — O99213 Obesity complicating pregnancy, third trimester: Secondary | ICD-10-CM | POA: Diagnosis not present

## 2021-10-10 DIAGNOSIS — O24419 Gestational diabetes mellitus in pregnancy, unspecified control: Secondary | ICD-10-CM | POA: Insufficient documentation

## 2021-10-10 DIAGNOSIS — E039 Hypothyroidism, unspecified: Secondary | ICD-10-CM | POA: Insufficient documentation

## 2021-10-10 DIAGNOSIS — O99333 Smoking (tobacco) complicating pregnancy, third trimester: Secondary | ICD-10-CM | POA: Insufficient documentation

## 2021-10-10 DIAGNOSIS — E669 Obesity, unspecified: Secondary | ICD-10-CM | POA: Insufficient documentation

## 2021-10-10 DIAGNOSIS — Z3A33 33 weeks gestation of pregnancy: Secondary | ICD-10-CM | POA: Insufficient documentation

## 2021-10-10 DIAGNOSIS — N189 Chronic kidney disease, unspecified: Secondary | ICD-10-CM | POA: Insufficient documentation

## 2021-10-10 DIAGNOSIS — O26833 Pregnancy related renal disease, third trimester: Secondary | ICD-10-CM | POA: Insufficient documentation

## 2021-10-10 DIAGNOSIS — O09893 Supervision of other high risk pregnancies, third trimester: Secondary | ICD-10-CM | POA: Diagnosis not present

## 2021-10-10 DIAGNOSIS — F17210 Nicotine dependence, cigarettes, uncomplicated: Secondary | ICD-10-CM | POA: Insufficient documentation

## 2021-10-10 DIAGNOSIS — O0992 Supervision of high risk pregnancy, unspecified, second trimester: Secondary | ICD-10-CM

## 2021-10-10 DIAGNOSIS — O0993 Supervision of high risk pregnancy, unspecified, third trimester: Secondary | ICD-10-CM | POA: Diagnosis not present

## 2021-10-10 DIAGNOSIS — O368330 Maternal care for abnormalities of the fetal heart rate or rhythm, third trimester, not applicable or unspecified: Principal | ICD-10-CM | POA: Insufficient documentation

## 2021-10-10 DIAGNOSIS — Z6841 Body Mass Index (BMI) 40.0 and over, adult: Secondary | ICD-10-CM | POA: Diagnosis not present

## 2021-10-10 DIAGNOSIS — O368390 Maternal care for abnormalities of the fetal heart rate or rhythm, unspecified trimester, not applicable or unspecified: Secondary | ICD-10-CM | POA: Diagnosis present

## 2021-10-10 DIAGNOSIS — Z91040 Latex allergy status: Secondary | ICD-10-CM | POA: Insufficient documentation

## 2021-10-10 DIAGNOSIS — O99283 Endocrine, nutritional and metabolic diseases complicating pregnancy, third trimester: Secondary | ICD-10-CM | POA: Insufficient documentation

## 2021-10-10 DIAGNOSIS — Z3493 Encounter for supervision of normal pregnancy, unspecified, third trimester: Secondary | ICD-10-CM | POA: Diagnosis not present

## 2021-10-10 DIAGNOSIS — O99212 Obesity complicating pregnancy, second trimester: Secondary | ICD-10-CM

## 2021-10-10 NOTE — OB Triage Note (Signed)
Discharge instructions, labor precautions, and follow-up care reviewed with patient. Reviewed instructions for breech presentation. All questions answered. Patient verbalized understanding. Discharged ambulatory off unit.  ?

## 2021-10-10 NOTE — OB Triage Note (Signed)
Patient is a G3P2 at [redacted]w[redacted]d who presents after a non-reactive NST in the office. Patient reports +FM, denies vaginal bleeding, contractions, and LOF. External monitors applied and assessing. Initial FHT 145. Vital signs WDL.  ?

## 2021-10-10 NOTE — Discharge Instructions (Signed)
[email or web address] ? ?Moxibustion: this is a very specific method of [place] medicine that really works to get a breech baby to turn. You can schedule an appointment with Dr Jonnie Finner, one of the chiropractors in [place].  ? ?If your baby does not turn spontaneously, you have two options, an external cephalic version to attempt to turn baby or deliver via cesarean section.  ? ? ?

## 2021-10-10 NOTE — Discharge Summary (Signed)
Brooke Aguirre is a 26 y.o. female. She is at [redacted]w[redacted]d gestation. Patient's last menstrual period was 02/16/2021 (exact date). ?Estimated Date of Delivery: 11/23/21 ? ?Prenatal care site: [facility] with transfer to [facility] at 31wks ? ?Current pregnancy complicated by:  ?Obesity BMI 44.64 ?GDM A1 ?Smoker ?Rubella/varicella non- immune ?Hx abn pap and leep 2019 ?Single umbilical artery ?Kidney stones 2020 ?Hx IUGR x2, 2014/2017 ?Desires BTL ? ?Chief complaint: sent from office for non- reactive NST in office.  ? ?- c/o decreased fetal movement and seen in triage evening of 10/09/21 ?- reports good FM today.  ? ?S: Resting comfortably. no CTX, no VB.no LOF,  Active fetal movement. Denies: HA, visual changes, SOB, or RUQ/epigastric pain ? ?Maternal Medical History:  ? ?Past Medical History:  ?Diagnosis Date  ? Abnormal Pap smear of cervix   ? Chronic kidney disease   ? CHRONIC  RIGHT PYELONEPHRITIS  ? Family history of adverse reaction to anesthesia   ? PTS MOM WENT INTO COMA AFTER HYSTERECTOMY  ? Gestational diabetes   ? Heart murmur   ? History of lithotripsy   ? HPV in female   ? Hypothyroidism   ? Kidney stone   ? Thyroid goiter   ? ? ?Past Surgical History:  ?Procedure Laterality Date  ? EXTRACORPOREAL SHOCK WAVE LITHOTRIPSY Left 04/14/2019  ? Procedure: EXTRACORPOREAL SHOCK WAVE LITHOTRIPSY (ESWL);  Surgeon: Abbie Sons, MD;  Location: [facility] ORS;  Service: Urology;  Laterality: Left;  ? EXTRACORPOREAL SHOCK WAVE LITHOTRIPSY Right 05/05/2019  ? Procedure: EXTRACORPOREAL SHOCK WAVE LITHOTRIPSY (ESWL);  Surgeon: Abbie Sons, MD;  Location: [facility] ORS;  Service: Urology;  Laterality: Right;  ? LEEP N/A 05/03/2018  ? Procedure: LOOP ELECTROSURGICAL EXCISION PROCEDURE (LEEP);  Surgeon: Brayton Mars, MD;  Location: [facility] ORS;  Service: Gynecology;  Laterality: N/A;  ? REMOVAL OF DRUG DELIVERY IMPLANT Left 12/15/2014  ? Procedure: REMOVAL OF DRUG DELIVERY IMPLANT;  Surgeon: Gae Dry, MD;  Location: [facility] ORS;   Service: Gynecology;  Laterality: Left;  ? ? ?Allergies  ?Allergen Reactions  ? Kiwi Extract Anaphylaxis  ? Amoxicillin Hives, Rash and Other (See Comments)  ?  Blisters ?Has patient had a PCN reaction causing immediate rash, facial/tongue/throat swelling, SOB or lightheadedness with hypotension: No ?Has patient had a PCN reaction causing severe rash involving mucus membranes or skin necrosis: Yes ?Has patient had a PCN reaction that required hospitalization: No ?Has patient had a PCN reaction occurring within the last 10 years: Unknown ?If all of the above answers are "NO", then may proceed with Cephalosporin use. ?  ? Ciprofloxacin Hives  ?  blisters  ? Penicillins Rash and Other (See Comments)  ?  Has patient had a PCN reaction causing immediate rash, facial/tongue/throat swelling, SOB or lightheadedness with hypotension:no ?Has patient had a PCN reaction causing severe rash involving mucus membranes or skin necrosis: yes ?Has patient had a PCN reaction that required hospitalization no ?Has patient had a PCN reaction occurring within the last 10 years: yes ?If all of the above answers are "NO", then may proceed with Cephalosporin use. ?  ? Tramadol Itching and Hives  ? Cranberry Rash  ? Latex Rash and Hives  ? Sulfa Antibiotics Rash and Other (See Comments)  ?  BLISTERS  ? ? ?Prior to Admission medications   ?Medication Sig Start Date End Date Taking? Authorizing Provider  ?Prenatal Vit-Fe Fumarate-FA (MULTIVITAMIN-PRENATAL) 27-0.8 MG TABS tablet Take 1 tablet by mouth daily at 12 noon.   Yes  [provider]  ?Accu-Chek Softclix Lancets lancets 1 each by Other route 4 (four) times daily. Use as instructed 05/22/21   Malachy Mood, MD  ?Blood Glucose Monitoring Suppl (ACCU-CHEK GUIDE) w/Device KIT 1 kit by Does not apply route as directed. 05/22/21   Malachy Mood, MD  ?glucose blood (ACCU-CHEK GUIDE) test strip Use as instructed 05/22/21   Malachy Mood, MD  ?ondansetron (ZOFRAN ODT) 4 MG  disintegrating tablet Take 1 tablet (4 mg total) by mouth every 8 (eight) hours as needed. 05/16/21   Menshew, Dannielle Karvonen, PA-C  ?polyethylene glycol powder (MIRALAX) 17 GM/SCOOP powder Take 9 g by mouth daily. ?Patient taking differently: Take 9 g by mouth daily. Takes as needed 05/30/21   Naaman Plummer, MD  ?promethazine (PHENERGAN) 25 MG tablet Take 1 tablet (25 mg total) by mouth every 8 (eight) hours as needed for nausea or vomiting. 10/18/20 11/13/20  Couture, Cortni S, PA-C  ? ? ? ? ?Social History: She  reports that she has been smoking cigarettes. She has a 5.00 pack-year smoking history. She has never used smokeless tobacco. She reports that she does not currently use alcohol. She reports that she does not use drugs. ? ?Family History: family history includes Diabetes in her maternal grandmother and mother; Hepatitis C in her father and mother.  ? ?Review of Systems: A full review of systems was performed and negative except as noted in the HPI.   ? ? ?O: ? BP 122/67 (BP Location: Left Arm)   Pulse 95   Temp 98 ?F (36.7 ?C) (Oral)   Resp 16   Ht $R'4\' 11"'SM$  (1.499 m)   Wt 89.4 kg   LMP 02/16/2021 (Exact Date)   BMI 39.79 kg/m?  ?No results found for this or any previous visit (from the past 48 hour(s)).  ? ?Constitutional: NAD, AAOx3  ?HE/ENT: extraocular movements grossly intact, moist mucous membranes ?CV: RRR ?PULM: nl respiratory effort, CTABL     ?Abd: gravid, non-tender, non-distended, soft      ?Ext: Non-tender, Nonedematous   ?Psych: mood appropriate, speech normal ?Pelvic: deferred ? ?Fetal  monitoring: Cat I Appropriate for GA ?Baseline:  ?Variability: moderate ?Accelerations: present x >2 ?Decelerations absent ?Time 70mins ? ?BPP performed in [place] ?IMPRESSION: ?Single live intrauterine pregnancy with a BPP of 8/8. ?AFI: 10.2 cm ?Presentation: Breech ? ?A/P: 26 y.o. [redacted]w[redacted]d here for antenatal surveillance for nonreactive NST in office, GDMA1 ? ?Principle Diagnosis:  High risk pregnancy in third  trimester ? ?Labor: not present.  ?BPP 8/8 ?Fetal Wellbeing: Reassuring Cat 1 tracing with Reactive NST  ?Discussion regarding breech presentation, pt given written info on moxibustion and spinning babies.  ?D/c home stable, precautions reviewed, follow-up as scheduled.  ? ? ?Francetta Found, CNM ?10/10/2021  ?6:20 PM ? ?

## 2021-10-11 ENCOUNTER — Telehealth: Payer: Self-pay

## 2021-10-11 NOTE — Telephone Encounter (Signed)
Transition Care Management Unsuccessful Follow-up Telephone Call ? ?Date of discharge and from where:  10/10/2021-[facility] ? ?Attempts:  1st Attempt ? ?Reason for unsuccessful TCM follow-up call:  Left voice message ? ?  ?

## 2021-10-12 DIAGNOSIS — Z419 Encounter for procedure for purposes other than remedying health state, unspecified: Secondary | ICD-10-CM | POA: Diagnosis not present

## 2021-10-14 NOTE — Telephone Encounter (Signed)
Transition Care Management Unsuccessful Follow-up Telephone Call ? ?Date of discharge and from where:  10/10/2021-[facility] ? ?Attempts:  2nd Attempt ? ?Reason for unsuccessful TCM follow-up call:  Left voice message ? ?  ?

## 2021-10-15 ENCOUNTER — Other Ambulatory Visit: Payer: Medicaid Other

## 2021-10-16 ENCOUNTER — Encounter
Admission: RE | Admit: 2021-10-16 | Discharge: 2021-10-16 | Disposition: A | Discharge status: Home / Self Care | Payer: Medicaid Other | Source: Ambulatory Visit | Attending: Anesthesiology | Admitting: Anesthesiology

## 2021-10-16 ENCOUNTER — Encounter: Payer: Self-pay | Admitting: Anesthesiology

## 2021-10-16 NOTE — Telephone Encounter (Signed)
Transition Care Management Unsuccessful Follow-up Telephone Call ? ?Date of discharge and from where:  10/10/2021-[facility] ? ?Attempts:  3rd Attempt ? ?Reason for unsuccessful TCM follow-up call:  Left voice message ? ?  ?

## 2021-10-16 NOTE — Consult Note (Addendum)
Anesthesiology consult note ( Ambulatory referral to OB anesthesia for morbid obesity) : ? ?I had the distinct pleasure of meeting Brooke Aguirre today for an OB anesthesia precheck.  She has a history of morbid obesity with a BMI today of 40.  Patient reports no previous problems with anesthesia, no problems with her back and has a MP score of 1.  We discussed the risks of Obesity in Pregnancy including but not limited to: ?There is an increased risk of airway complications, including inability to place a breathing tube, should an emergency cesarean delivery be required. ?Epidural placement is more difficult in obesity, often requires multiple attempts, more often results in inadequate pain relief, more often requires replacement due to movement of the epidural catheter and more often results in accidental dural puncture and post dural puncture headache. ? ?Pt reports previous history of one-sided epidural with poor pain control. Review of notes was performed with no explanation found for her failed epidural. She reports she is breech. Anesthesia for version and possible cesarean section was discussed.  ? ?Patient voiced understanding acknowledging that we had discussed her pathway forward.  ? ?Nelta Numbers MD Anesthesia ?

## 2021-10-17 ENCOUNTER — Other Ambulatory Visit: Payer: Self-pay

## 2021-10-17 DIAGNOSIS — O24410 Gestational diabetes mellitus in pregnancy, diet controlled: Secondary | ICD-10-CM

## 2021-10-17 DIAGNOSIS — Z720 Tobacco use: Secondary | ICD-10-CM

## 2021-10-17 DIAGNOSIS — R011 Cardiac murmur, unspecified: Secondary | ICD-10-CM

## 2021-10-17 DIAGNOSIS — Q270 Congenital absence and hypoplasia of umbilical artery: Secondary | ICD-10-CM | POA: Diagnosis not present

## 2021-10-17 DIAGNOSIS — O99213 Obesity complicating pregnancy, third trimester: Secondary | ICD-10-CM | POA: Diagnosis not present

## 2021-10-17 DIAGNOSIS — O09893 Supervision of other high risk pregnancies, third trimester: Secondary | ICD-10-CM | POA: Diagnosis not present

## 2021-10-22 ENCOUNTER — Other Ambulatory Visit: Payer: Self-pay

## 2021-10-22 ENCOUNTER — Ambulatory Visit: Payer: Medicaid Other | Attending: Obstetrics and Gynecology

## 2021-10-22 VITALS — BP 143/80 | HR 112 | Temp 97.4°F | Wt 197.5 lb

## 2021-10-22 DIAGNOSIS — Z720 Tobacco use: Secondary | ICD-10-CM

## 2021-10-22 DIAGNOSIS — O99212 Obesity complicating pregnancy, second trimester: Secondary | ICD-10-CM | POA: Diagnosis not present

## 2021-10-22 DIAGNOSIS — Z3A35 35 weeks gestation of pregnancy: Secondary | ICD-10-CM | POA: Diagnosis not present

## 2021-10-22 DIAGNOSIS — O99213 Obesity complicating pregnancy, third trimester: Secondary | ICD-10-CM | POA: Diagnosis not present

## 2021-10-22 DIAGNOSIS — O24410 Gestational diabetes mellitus in pregnancy, diet controlled: Secondary | ICD-10-CM | POA: Diagnosis not present

## 2021-10-22 DIAGNOSIS — O289 Unspecified abnormal findings on antenatal screening of mother: Secondary | ICD-10-CM | POA: Insufficient documentation

## 2021-10-22 DIAGNOSIS — O0993 Supervision of high risk pregnancy, unspecified, third trimester: Secondary | ICD-10-CM

## 2021-10-22 DIAGNOSIS — O35BXX0 Maternal care for other (suspected) fetal abnormality and damage, fetal cardiac anomalies, not applicable or unspecified: Secondary | ICD-10-CM

## 2021-10-22 DIAGNOSIS — O09293 Supervision of pregnancy with other poor reproductive or obstetric history, third trimester: Secondary | ICD-10-CM | POA: Diagnosis not present

## 2021-10-22 DIAGNOSIS — R011 Cardiac murmur, unspecified: Secondary | ICD-10-CM

## 2021-10-22 DIAGNOSIS — E669 Obesity, unspecified: Secondary | ICD-10-CM | POA: Diagnosis not present

## 2021-10-23 DIAGNOSIS — O24419 Gestational diabetes mellitus in pregnancy, unspecified control: Secondary | ICD-10-CM | POA: Diagnosis not present

## 2021-10-23 DIAGNOSIS — O99333 Smoking (tobacco) complicating pregnancy, third trimester: Secondary | ICD-10-CM | POA: Diagnosis not present

## 2021-10-23 DIAGNOSIS — O35BXX0 Maternal care for other (suspected) fetal abnormality and damage, fetal cardiac anomalies, not applicable or unspecified: Secondary | ICD-10-CM | POA: Diagnosis not present

## 2021-10-23 DIAGNOSIS — O0993 Supervision of high risk pregnancy, unspecified, third trimester: Secondary | ICD-10-CM | POA: Diagnosis not present

## 2021-10-23 DIAGNOSIS — Q203 Discordant ventriculoarterial connection: Secondary | ICD-10-CM | POA: Diagnosis not present

## 2021-10-23 DIAGNOSIS — Z3A35 35 weeks gestation of pregnancy: Secondary | ICD-10-CM | POA: Diagnosis not present

## 2021-10-24 ENCOUNTER — Other Ambulatory Visit: Payer: Self-pay | Admitting: Obstetrics and Gynecology

## 2021-10-24 NOTE — Patient Instructions (Signed)
Hi Brooke Aguirre, thanks for speaking with me-please reach out if we can provide any support. ? ?Brooke Aguirre was given information about Medicaid Managed Care team care coordination services as a part of their [facility] Medicaid benefit. Brooke Aguirre verbally consented to engagement with the [facility] Managed Care team.  ? ?If you are experiencing a medical emergency, please call 911 or report to your local emergency department or urgent care.  ? ?If you have a non-emergency medical problem during routine business hours, please contact your provider's office and ask to speak with a nurse.  ? ?For questions related to your [facility] health plan, please call: [phone] or go here:[email or web address] ? ?If you would like to schedule transportation through your [facility] plan, please call the following number at least 2 days in advance of your appointment: [phone]. ? You can also use the MTM portal or MTM mobile app to manage your rides. For the portal, please go to [email or web address] ? ?Call the Behavioral Health Crisis Line at [phone], at any time, 24 hours a day, 7 days a week. If you are in danger or need immediate medical attention call 911. ? ?If you would like help to quit smoking, call 1-800-QUIT-NOW (930-341-7672) OR Espa?ol: 1-855-D?jelo-Ya 989-347-8727) o para m?s informaci?n haga clic aqu? or Text READY to 200-400 to register via text ? ?Brooke Aguirre - following are the goals we discussed in your visit today:  ?Goals addressed:   ?Goal: Establish Plan of Care for High Risk Pregnancy   ?Start Date: 09/23/2021  ?Expected End Date: 12/24/2021  ?Priority: High  ?Note:   ?Timeframe:  Short-Term Goal ?Priority:  High ?Start Date:       09/23/21                      ?Expected End Date:  ongoing                    ? ?Follow Up Date 11/27/21 ?  ?- schedule appointment for flu shot ?- schedule appointment for vaccines needed due to my age or health ?- schedule recommended  health tests (blood work, mammogram, colonoscopy, pap test) ?- schedule and keep appointment for annual check-up  ?  ?Why is this important?   ?Screening tests can find diseases early when they are easier to treat.  ?Your doctor or nurse will talk with you about which tests are important for you.  ?Getting shots for common diseases like the flu and shingles will help prevent them.   ?10/24/21:  OB Care being transferred to [facility] due to cardiac anomalies  ? ?Patient verbalizes understanding of instructions and care plan provided today and agrees to view in MyChart. Active MyChart status confirmed with patient.   ? ?The Managed Medicaid care management team will reach out to the patient again over the next 30 days.  ?The  Patient  has been provided with contact information for the Managed Medicaid care management team and has been advised to call with any health related questions or concerns.  ? ?Kathi Der RN, BSN ?[place]  [facility] ?Care Management Coordinator - Managed Medicaid High Risk ?[phone] ?  ?Following is a copy of your plan of care:  ?Care Plan : RN Care Manager Plan of Care  ?Updates made by Danie Chandler, RN since 10/24/2021 12:00 AM  ?  ? ?Problem: High Risk Pregnancy   ?Priority: High  ?  ? ?Goal: Self-Management Plan Developed   ?Start  Date: 09/23/2021  ?Expected End Date: 12/24/2021  ?Recent Progress: On track  ?Priority: High  ?Note:   ?Current Barriers:  ?31.3 week pregnancy with GDM and 1 vessel cord ?10/24/21:  OB care being transferred to [facility] due to cardiac anomalies.  Blood sugars 70-115 ? ?RNCM Clinical Goal(s):  ?Patient will verbalize understanding of plan for management of GDM as evidenced by patient report ?verbalize basic understanding of  GDM disease process and self health management plan as evidenced by patient report ?take all medications exactly as prescribed and will call provider for medication related questions as evidenced by patient report ?attend all  scheduled medical appointments as evidenced by patient report ?continue to work with RN Care Manager to address care management and care coordination needs related to  GDM as evidenced by adherence to CM Team Scheduled appointments through collaboration with RN Care manager, provider, and care team.  ? ?Interventions: ?Inter-disciplinary care team collaboration (see longitudinal plan of care) ?Evaluation of current treatment plan related to  self management and patient's adherence to plan as established by provider ? ?Gestational Diabetes ?Placental Abnormality  ?Recommended adequate rest     and Reviewed prescribed diet and encouraged optimizing nutrition during pregnancy      ? ?Patient Goals/Self-Care Activities: ?Take all medications as prescribed ?Attend all scheduled provider appointments ?Call pharmacy for medication refills 3-7 days in advance of running out of medications ?Perform all self care activities independently  ?Perform IADL's (shopping, preparing meals, housekeeping, managing finances) independently ?Call provider office for new concerns or questions  ? ?Follow Up Plan:  The patient has been provided with contact information for the care management team and has been advised to call with any health related questions or concerns.  ?The care management team will reach out to the patient again over the next 30 days.   ? ?  ?

## 2021-10-24 NOTE — Patient Outreach (Signed)
?Medicaid Managed Care   ?Nurse Care Manager Note ? ?10/24/2021 ?Name:  Brooke Aguirre MRN:  [id number] DOB:  03-07-1996 ? ?Brooke Aguirre is an 26 y.o. year old female who is a primary patient of [facility] Coordination team was consulted for assistance with:    ?Obstetrics healthcare management needs ? ?Brooke Aguirre was given information about Medicaid Managed Care Coordination team services today. Brooke Aguirre Patient agreed to services and verbal consent obtained. ? ?Engaged with patient by telephone for follow up visit in response to provider referral for case management and/or care coordination services.  ? ?Assessments/Interventions:  Review of past medical history, allergies, medications, health status, including review of consultants reports, laboratory and other test data, was performed as part of comprehensive evaluation and provision of chronic care management services. ? ?SDOH (Social Determinants of Health) assessments and interventions performed: ?SDOH Interventions   ? ?Flowsheet Row Most Recent Value  ?SDOH Interventions   ?Housing Interventions Intervention Not Indicated  ? ?  ?Care Plan ? ?Allergies  ?Allergen Reactions  ? Kiwi Extract Anaphylaxis  ? Amoxicillin Hives, Rash and Other (See Comments)  ?  Blisters ?Has patient had a PCN reaction causing immediate rash, facial/tongue/throat swelling, SOB or lightheadedness with hypotension: No ?Has patient had a PCN reaction causing severe rash involving mucus membranes or skin necrosis: Yes ?Has patient had a PCN reaction that required hospitalization: No ?Has patient had a PCN reaction occurring within the last 10 years: Unknown ?If all of the above answers are "NO", then may proceed with Cephalosporin use. ?  ? Ciprofloxacin Hives  ?  blisters  ? Penicillins Rash and Other (See Comments)  ?  Has patient had a PCN reaction causing immediate rash, facial/tongue/throat swelling, SOB or lightheadedness  with hypotension:no ?Has patient had a PCN reaction causing severe rash involving mucus membranes or skin necrosis: yes ?Has patient had a PCN reaction that required hospitalization no ?Has patient had a PCN reaction occurring within the last 10 years: yes ?If all of the above answers are "NO", then may proceed with Cephalosporin use. ?  ? Tramadol Itching and Hives  ? Cranberry Rash  ? Latex Rash and Hives  ? Sulfa Antibiotics Rash and Other (See Comments)  ?  BLISTERS  ? ?Medications Reviewed Today   ? ? Reviewed by Gayla Medicus, RN (Registered Nurse) on 10/24/21 at 1406  Med List Status: <None>  ? ?Medication Order Taking? Sig Documenting Provider Last Dose Status Informant  ?Accu-Chek Softclix Lancets lancets [id number] No 1 each by Other route 4 (four) times daily. Use as instructed Malachy Mood, MD Taking Active   ?Blood Glucose Monitoring Suppl (ACCU-CHEK GUIDE) w/Device KIT [id number] No 1 kit by Does not apply route as directed. Malachy Mood, MD Taking Active   ?glucose blood (ACCU-CHEK GUIDE) test strip [id number] No Use as instructed Malachy Mood, MD Taking Active   ?ondansetron (ZOFRAN ODT) 4 MG disintegrating tablet [id number] No Take 1 tablet (4 mg total) by mouth every 8 (eight) hours as needed.  ?Patient not taking: Reported on 10/22/2021  ? Menshew, Dannielle Karvonen, PA-C Not Taking Active   ?polyethylene glycol powder (MIRALAX) 17 GM/SCOOP powder [id number] No Take 9 g by mouth daily.  ?Patient not taking: Reported on 10/22/2021  ? Naaman Plummer, MD Not Taking Active Self  ?Prenatal Vit-Fe Fumarate-FA (MULTIVITAMIN-PRENATAL) 27-0.8 MG TABS tablet [id number] No Take 1 tablet by mouth daily at 12 noon. [provider] Taking  Active   ?  Discontinued 11/13/20 0527   ? ?  ?  ? ?  ? ?Patient Active Problem List  ? Diagnosis Date Noted  ? Non-reassuring electronic fetal monitoring tracing 10/10/2021  ? Decreased fetal movement 10/09/2021  ? Supervision of other high risk pregnancies,  third trimester 09/20/2021  ? Single umbilical artery 54/98/2641  ? Obesity affecting pregnancy in second trimester 06/26/2021  ? Diet controlled gestational diabetes mellitus (GDM) in second trimester 06/26/2021  ? Gestational diabetes mellitus (GDM), antepartum 05/22/2021  ? Contact with or exposure to varicella 05/07/2021  ? Supervision of high-risk pregnancy 04/12/2021  ? Obesity affecting pregnancy 04/12/2021  ? Left nephrolithiasis 03/30/2019  ? Nephrolithiasis 03/29/2019  ? Status post LEEP (loop electrosurgical excision procedure) of cervix 05/03/2018  ? 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 Tobacco user 02/25/2018  ? Heart murmur 05/03/2012  ? URI, acute 05/03/2012  ? Pyelonephritis 12/02/2011  ? ?Obstetric conditions to be addressed/monitored per PCP order:   obstetric case management :  GDM, 1 vessel cord, cardiac anomalies ? ?Care Plan : RN Care Manager Plan of Care  ?Updates made by Gayla Medicus, RN since 10/24/2021 12:00 AM  ?  ? ?Problem: High Risk Pregnancy   ?Priority: High  ?  ? 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  Blood sugars 70-115 ? ?RNCM Clinical Goal(s):  ?Patient will verbalize understanding of plan for management of GDM as evidenced by patient report ?verbalize basic understanding of  GDM disease process and self health management plan as evidenced by patient report ?take all medications exactly as prescribed and will call provider for medication related questions as evidenced by patient report ?attend all scheduled medical appointments as evidenced by patient report ?continue to work with RN Care Manager to address care management and care coordination needs related to  GDM as evidenced by adherence to CM Team Scheduled  appointments through collaboration with RN Care manager, provider, and care team.  ? ?Interventions: ?Inter-disciplinary care team collaboration (see longitudinal plan of care) ?Evaluation of current treatment plan related to  self management and patient's adherence to plan as established by provider ? ?Gestational Diabetes ?Placental Abnormality  ?Recommended adequate rest     and Reviewed prescribed diet and encouraged optimizing nutrition during pregnancy      ? ?Patient Goals/Self-Care Activities: ?Take all medications as prescribed ?Attend all scheduled provider appointments ?Call pharmacy for medication refills 3-7 days in advance of running out of medications ?Perform all self care activities independently  ?Perform IADL's (shopping, preparing meals, housekeeping, managing finances) independently ?Call provider office for new concerns or questions  ? ?Follow Up Plan:  The patient has been provided with contact information for the care management team and has been advised to call with any health related questions or concerns.  ?The care management team will reach out to the patient again over the next 30 days.   ? ?Goal: Establish Plan of Care for High Risk Pregnancy   ?Start Date: 09/23/2021  ?Expected End Date: 12/24/2021  ?Priority: High  ?Note:   ?Timeframe:  Short-Term Goal ?Priority:  High ?Start Date:       09/23/21                      ?  Expected End Date:  ongoing                    ? ?Follow Up Date 11/27/21 ?  ?- schedule appointment for flu shot ?- schedule appointment for vaccines needed due to my age or health ?- schedule recommended health tests (blood work, mammogram, colonoscopy, pap test) ?- schedule and keep appointment for annual check-up  ?  ?Why is this important?   ?Screening tests can find diseases early when they are easier to treat.  ?Your doctor or nurse will talk with you about which tests are important for you.  ?Getting shots for common diseases like the flu and shingles will help  prevent them.   ?10/24/21:  OB Care being transferred to [place] due to cardiac anomalies  ? ?Follow Up:  Patient agrees to Care Plan and Follow-up. ? ?Plan: The Managed Medicaid care management team will reach out to th

## 2021-10-29 ENCOUNTER — Other Ambulatory Visit: Payer: Medicaid Other

## 2021-10-29 DIAGNOSIS — O3443 Maternal care for other abnormalities of cervix, third trimester: Secondary | ICD-10-CM | POA: Diagnosis not present

## 2021-10-29 DIAGNOSIS — Z9889 Other specified postprocedural states: Secondary | ICD-10-CM | POA: Diagnosis not present

## 2021-10-29 DIAGNOSIS — Z364 Encounter for antenatal screening for fetal growth retardation: Secondary | ICD-10-CM | POA: Diagnosis not present

## 2021-10-29 DIAGNOSIS — Z2839 Other underimmunization status: Secondary | ICD-10-CM | POA: Diagnosis not present

## 2021-10-29 DIAGNOSIS — O99213 Obesity complicating pregnancy, third trimester: Secondary | ICD-10-CM | POA: Diagnosis not present

## 2021-10-29 DIAGNOSIS — O365990 Maternal care for other known or suspected poor fetal growth, unspecified trimester, not applicable or unspecified: Secondary | ICD-10-CM | POA: Diagnosis not present

## 2021-10-29 DIAGNOSIS — O09899 Supervision of other high risk pregnancies, unspecified trimester: Secondary | ICD-10-CM | POA: Diagnosis not present

## 2021-10-29 DIAGNOSIS — O35BXX0 Maternal care for other (suspected) fetal abnormality and damage, fetal cardiac anomalies, not applicable or unspecified: Secondary | ICD-10-CM | POA: Diagnosis not present

## 2021-10-29 DIAGNOSIS — O0993 Supervision of high risk pregnancy, unspecified, third trimester: Secondary | ICD-10-CM | POA: Diagnosis not present

## 2021-10-29 DIAGNOSIS — O365930 Maternal care for other known or suspected poor fetal growth, third trimester, not applicable or unspecified: Secondary | ICD-10-CM | POA: Diagnosis not present

## 2021-10-29 DIAGNOSIS — O24410 Gestational diabetes mellitus in pregnancy, diet controlled: Secondary | ICD-10-CM | POA: Diagnosis not present

## 2021-10-29 DIAGNOSIS — Z3A36 36 weeks gestation of pregnancy: Secondary | ICD-10-CM | POA: Diagnosis not present

## 2021-10-29 DIAGNOSIS — O43893 Other placental disorders, third trimester: Secondary | ICD-10-CM | POA: Diagnosis not present

## 2021-10-29 DIAGNOSIS — Z23 Encounter for immunization: Secondary | ICD-10-CM | POA: Diagnosis not present

## 2021-10-31 ENCOUNTER — Other Ambulatory Visit: Payer: Self-pay

## 2021-10-31 ENCOUNTER — Ambulatory Visit: Payer: Medicaid Other | Attending: Obstetrics

## 2021-10-31 VITALS — BP 103/64 | HR 94 | Ht 59.0 in | Wt 197.5 lb

## 2021-10-31 DIAGNOSIS — O35BXX0 Maternal care for other (suspected) fetal abnormality and damage, fetal cardiac anomalies, not applicable or unspecified: Secondary | ICD-10-CM | POA: Insufficient documentation

## 2021-10-31 DIAGNOSIS — Z3A36 36 weeks gestation of pregnancy: Secondary | ICD-10-CM | POA: Diagnosis not present

## 2021-10-31 DIAGNOSIS — O99213 Obesity complicating pregnancy, third trimester: Secondary | ICD-10-CM

## 2021-10-31 DIAGNOSIS — O09293 Supervision of pregnancy with other poor reproductive or obstetric history, third trimester: Secondary | ICD-10-CM | POA: Diagnosis not present

## 2021-10-31 DIAGNOSIS — E669 Obesity, unspecified: Secondary | ICD-10-CM | POA: Diagnosis not present

## 2021-10-31 DIAGNOSIS — O24410 Gestational diabetes mellitus in pregnancy, diet controlled: Secondary | ICD-10-CM

## 2021-10-31 DIAGNOSIS — O0993 Supervision of high risk pregnancy, unspecified, third trimester: Secondary | ICD-10-CM

## 2021-10-31 DIAGNOSIS — O289 Unspecified abnormal findings on antenatal screening of mother: Secondary | ICD-10-CM | POA: Diagnosis not present

## 2021-10-31 DIAGNOSIS — O365930 Maternal care for other known or suspected poor fetal growth, third trimester, not applicable or unspecified: Secondary | ICD-10-CM | POA: Diagnosis not present

## 2021-11-05 DIAGNOSIS — O99213 Obesity complicating pregnancy, third trimester: Secondary | ICD-10-CM | POA: Diagnosis not present

## 2021-11-05 DIAGNOSIS — O24410 Gestational diabetes mellitus in pregnancy, diet controlled: Secondary | ICD-10-CM | POA: Diagnosis not present

## 2021-11-05 DIAGNOSIS — O09899 Supervision of other high risk pregnancies, unspecified trimester: Secondary | ICD-10-CM | POA: Diagnosis not present

## 2021-11-05 DIAGNOSIS — O0993 Supervision of high risk pregnancy, unspecified, third trimester: Secondary | ICD-10-CM | POA: Diagnosis not present

## 2021-11-05 DIAGNOSIS — Z3683 Encounter for fetal screening for congenital cardiac abnormalities: Secondary | ICD-10-CM | POA: Diagnosis not present

## 2021-11-05 DIAGNOSIS — Z3A37 37 weeks gestation of pregnancy: Secondary | ICD-10-CM | POA: Diagnosis not present

## 2021-11-05 DIAGNOSIS — O321XX0 Maternal care for breech presentation, not applicable or unspecified: Secondary | ICD-10-CM | POA: Diagnosis not present

## 2021-11-05 DIAGNOSIS — O3443 Maternal care for other abnormalities of cervix, third trimester: Secondary | ICD-10-CM | POA: Diagnosis not present

## 2021-11-05 DIAGNOSIS — E669 Obesity, unspecified: Secondary | ICD-10-CM | POA: Diagnosis not present

## 2021-11-05 DIAGNOSIS — Q270 Congenital absence and hypoplasia of umbilical artery: Secondary | ICD-10-CM | POA: Diagnosis not present

## 2021-11-05 DIAGNOSIS — Z6839 Body mass index (BMI) 39.0-39.9, adult: Secondary | ICD-10-CM | POA: Diagnosis not present

## 2021-11-05 DIAGNOSIS — O43893 Other placental disorders, third trimester: Secondary | ICD-10-CM | POA: Diagnosis not present

## 2021-11-05 DIAGNOSIS — O35BXX0 Maternal care for other (suspected) fetal abnormality and damage, fetal cardiac anomalies, not applicable or unspecified: Secondary | ICD-10-CM | POA: Diagnosis not present

## 2021-11-07 ENCOUNTER — Other Ambulatory Visit: Payer: Medicaid Other

## 2021-11-11 DIAGNOSIS — Z419 Encounter for procedure for purposes other than remedying health state, unspecified: Secondary | ICD-10-CM | POA: Diagnosis not present

## 2021-11-12 DIAGNOSIS — O35BXX0 Maternal care for other (suspected) fetal abnormality and damage, fetal cardiac anomalies, not applicable or unspecified: Secondary | ICD-10-CM | POA: Diagnosis not present

## 2021-11-12 DIAGNOSIS — Z3683 Encounter for fetal screening for congenital cardiac abnormalities: Secondary | ICD-10-CM | POA: Diagnosis not present

## 2021-11-12 DIAGNOSIS — Z6839 Body mass index (BMI) 39.0-39.9, adult: Secondary | ICD-10-CM | POA: Diagnosis not present

## 2021-11-12 DIAGNOSIS — Z302 Encounter for sterilization: Secondary | ICD-10-CM | POA: Diagnosis not present

## 2021-11-12 DIAGNOSIS — O99333 Smoking (tobacco) complicating pregnancy, third trimester: Secondary | ICD-10-CM | POA: Diagnosis not present

## 2021-11-12 DIAGNOSIS — O0993 Supervision of high risk pregnancy, unspecified, third trimester: Secondary | ICD-10-CM | POA: Diagnosis not present

## 2021-11-12 DIAGNOSIS — O24410 Gestational diabetes mellitus in pregnancy, diet controlled: Secondary | ICD-10-CM | POA: Diagnosis not present

## 2021-11-12 DIAGNOSIS — Z3A38 38 weeks gestation of pregnancy: Secondary | ICD-10-CM | POA: Diagnosis not present

## 2021-11-12 DIAGNOSIS — E669 Obesity, unspecified: Secondary | ICD-10-CM | POA: Diagnosis not present

## 2021-11-12 DIAGNOSIS — Z98891 History of uterine scar from previous surgery: Secondary | ICD-10-CM | POA: Diagnosis not present

## 2021-11-12 DIAGNOSIS — O09899 Supervision of other high risk pregnancies, unspecified trimester: Secondary | ICD-10-CM | POA: Diagnosis not present

## 2021-11-12 DIAGNOSIS — O43893 Other placental disorders, third trimester: Secondary | ICD-10-CM | POA: Diagnosis not present

## 2021-11-12 DIAGNOSIS — F17210 Nicotine dependence, cigarettes, uncomplicated: Secondary | ICD-10-CM | POA: Diagnosis not present

## 2021-11-12 DIAGNOSIS — O99213 Obesity complicating pregnancy, third trimester: Secondary | ICD-10-CM | POA: Diagnosis not present

## 2021-11-19 DIAGNOSIS — Z882 Allergy status to sulfonamides status: Secondary | ICD-10-CM | POA: Diagnosis not present

## 2021-11-19 DIAGNOSIS — O99892 Other specified diseases and conditions complicating childbirth: Secondary | ICD-10-CM | POA: Diagnosis not present

## 2021-11-19 DIAGNOSIS — O99334 Smoking (tobacco) complicating childbirth: Secondary | ICD-10-CM | POA: Diagnosis not present

## 2021-11-19 DIAGNOSIS — O43193 Other malformation of placenta, third trimester: Secondary | ICD-10-CM | POA: Diagnosis not present

## 2021-11-19 DIAGNOSIS — Z789 Other specified health status: Secondary | ICD-10-CM | POA: Diagnosis not present

## 2021-11-19 DIAGNOSIS — O24420 Gestational diabetes mellitus in childbirth, diet controlled: Secondary | ICD-10-CM | POA: Diagnosis not present

## 2021-11-19 DIAGNOSIS — Z881 Allergy status to other antibiotic agents status: Secondary | ICD-10-CM | POA: Diagnosis not present

## 2021-11-19 DIAGNOSIS — D069 Carcinoma in situ of cervix, unspecified: Secondary | ICD-10-CM | POA: Diagnosis not present

## 2021-11-19 DIAGNOSIS — Z880 Allergy status to penicillin: Secondary | ICD-10-CM | POA: Diagnosis not present

## 2021-11-19 DIAGNOSIS — N879 Dysplasia of cervix uteri, unspecified: Secondary | ICD-10-CM | POA: Diagnosis not present

## 2021-11-19 DIAGNOSIS — O99214 Obesity complicating childbirth: Secondary | ICD-10-CM | POA: Diagnosis not present

## 2021-11-19 DIAGNOSIS — O35BXX0 Maternal care for other (suspected) fetal abnormality and damage, fetal cardiac anomalies, not applicable or unspecified: Secondary | ICD-10-CM | POA: Diagnosis not present

## 2021-11-19 DIAGNOSIS — F17210 Nicotine dependence, cigarettes, uncomplicated: Secondary | ICD-10-CM | POA: Diagnosis not present

## 2021-11-19 DIAGNOSIS — E669 Obesity, unspecified: Secondary | ICD-10-CM | POA: Diagnosis not present

## 2021-11-19 DIAGNOSIS — Z3A39 39 weeks gestation of pregnancy: Secondary | ICD-10-CM | POA: Diagnosis not present

## 2021-11-19 DIAGNOSIS — O24410 Gestational diabetes mellitus in pregnancy, diet controlled: Secondary | ICD-10-CM | POA: Diagnosis not present

## 2021-11-19 DIAGNOSIS — Z885 Allergy status to narcotic agent status: Secondary | ICD-10-CM | POA: Diagnosis not present

## 2021-11-28 ENCOUNTER — Other Ambulatory Visit: Payer: Self-pay | Admitting: Obstetrics and Gynecology

## 2021-11-28 DIAGNOSIS — O1205 Gestational edema, complicating the puerperium: Secondary | ICD-10-CM | POA: Diagnosis not present

## 2021-11-28 DIAGNOSIS — O165 Unspecified maternal hypertension, complicating the puerperium: Secondary | ICD-10-CM | POA: Diagnosis not present

## 2021-11-28 DIAGNOSIS — G8918 Other acute postprocedural pain: Secondary | ICD-10-CM | POA: Diagnosis not present

## 2021-11-28 DIAGNOSIS — O99893 Other specified diseases and conditions complicating puerperium: Secondary | ICD-10-CM | POA: Diagnosis not present

## 2021-11-28 NOTE — Patient Instructions (Signed)
Hi Brooke Aguirre glad you are recuperating okay and that your son is recuperating also-please call us if needed and I will follow up with you in June.  Brooke Aguirre was given information about Medicaid Managed Care team care coordination services as a part of their [facility] Medicaid benefit. Brooke Aguirre verbally consented to engagement with the [facility] Managed Care team.   If you are experiencing a medical emergency, please call 911 or report to your local emergency department or urgent care.   If you have a non-emergency medical problem during routine business hours, please contact your provider's office and ask to speak with a nurse.   For questions related to your [facility] health plan, please call: [phone] or go here:[email or web address]  If you would like to schedule transportation through your [facility] plan, please call the following number at least 2 days in advance of your appointment: [phone].  You can also use the MTM portal or MTM mobile app to manage your rides. For the portal, please go to [email or web address]  Call the [facility] Crisis Line at [phone], at any time, 24 hours a day, 7 days a week. If you are in danger or need immediate medical attention call 911.  If you would like help to quit smoking, call 1-800-QUIT-NOW (705-784-6105) OR Espaol: 1-855-Djelo-Ya (1-660-630-1601) o para ms informacin haga clic aqu or Text READY to 093-235 to register via text  Brooke Aguirre - following are the goals we discussed in your visit today:   Goals Addressed    Goal: Establish Plan of Care for High Risk Pregnancy   Start Date: 09/23/2021  Expected End Date: 12/24/2021  Priority: High  Note:   Timeframe:  Short-Term Goal Priority:  High Start Date:       09/23/21                      Expected End Date:  ongoing                     Follow Up Date 12/30/21   - schedule appointment for flu shot - schedule appointment for  vaccines needed due to my age or health - schedule recommended health tests (blood work, mammogram, colonoscopy, pap test) - schedule and keep appointment for annual check-up    Why is this important?   Screening tests can find diseases early when they are easier to treat.  Your doctor or nurse will talk with you about which tests are important for you.  Getting shots for common diseases like the flu and shingles will help prevent them.   11/28/21:  Patient delivered by C-section 11/19/21-follow up appointments scheduled   Patient verbalizes understanding of instructions and care plan provided today and agrees to view in MyChart. Active MyChart status and patient understanding of how to access instructions and care plan via MyChart confirmed with patient.     The Managed Medicaid care management team will reach out to the patient again over the next 30 business  days.  The  Patient  has been provided with contact information for the Managed Medicaid care management team and has been advised to call with any health related questions or concerns.   Kathi Der RN, BSN [place]  [facility] Care Management Coordinator - Managed Medicaid High Risk [phone]   Following is a copy of your plan of care:  Care Plan : RN Care Manager Plan of Care  Updates made by Aiyannah Fayad, Calvert Cantor,  RN since 11/28/2021 12:00 AM     Problem: High Risk Pregnancy   Priority: High     Goal: Self-Management Plan Developed   Start Date: 09/23/2021  Expected End Date: 03/01/2022  Recent Progress: On track  Priority: High  Note:   Current Barriers:  Obstetric case management-delivered 11/19/21 by C-section-full term.  Baby had open heart surgery within 48 hours of delivery due to cardiac anomalies and doing well per patient.  Patient no longer required to check blood sugars.  Patient is checking blood pressure once a day now due to increased blood pressure since delivery-has follow up appointments  scheduled.  BP 140/90 today-discussed pre eclampsia symptoms.  RNCM Clinical Goal(s):  Patient will verbalize understanding of plan for management of pre eclampsia as evidenced by patient report verbalize basic understanding of pre eclampsia  disease process and self health management plan as evidenced by patient report take all medications exactly as prescribed and will call provider for medication related questions as evidenced by patient report attend all scheduled medical appointments as evidenced by patient report continue to work with RN Care Manager to address care management and care coordination needs related to  GDM as evidenced by adherence to CM Team Scheduled appointments through collaboration with RN Care manager, provider, and care team.  Patient will check blood pressure every day and call for readings outside of parameters.  Interventions: Inter-disciplinary care team collaboration (see longitudinal plan of care) Evaluation of current treatment plan related to  self management and patient's adherence to plan as established by provider  Gestational Diabetes Placental Abnormality  Recommended adequate rest     and Reviewed prescribed diet and encouraged optimizing nutrition during pregnancy  Pre-eclampsia Discussed signs and symptoms      Patient Goals/Self-Care Activities: Take all medications as prescribed Attend all scheduled provider appointments Call pharmacy for medication refills 3-7 days in advance of running out of medications Perform all self care activities independently  Perform IADL's (shopping, preparing meals, housekeeping, managing finances) independently Call provider office for new concerns or questions   Follow Up Plan:  The patient has been provided with contact information for the care management team and has been advised to call with any health related questions or concerns.  The care management team will reach out to the patient again over the next  30 days.

## 2021-11-28 NOTE — Patient Outreach (Signed)
Medicaid Managed Care   Nurse Care Manager Note  11/28/2021 Name:  Brooke Aguirre MRN:  [id number] DOB:  27-Jul-1995  Brooke Aguirre is an 26 y.o. year old female who is a primary patient of [facility] Coordination team was consulted for assistance with:    Obstetrics healthcare management needs  Brooke Aguirre was given information about Medicaid Managed Care Coordination team services today. Brooke Aguirre Patient agreed to services and verbal consent obtained.  Engaged with patient by telephone for follow up visit in response to provider referral for case management and/or care coordination services.   Assessments/Interventions:  Review of past medical history, allergies, medications, health status, including review of consultants reports, laboratory and other test data, was performed as part of comprehensive evaluation and provision of chronic care management services.  SDOH (Social Determinants of Health) assessments and interventions performed: SDOH Interventions    Flowsheet Row Most Recent Value  SDOH Interventions   Physical Activity Interventions Intervention Not Indicated  [patient has c-section 11/19/21]  Stress Interventions Other (Comment)  [patient deliverd by c-section 11/19/21-baby is in the PICU at [facility] open heart surgery less than 48 hours after being born]     Care Plan  Allergies  Allergen Reactions   Kiwi Extract Anaphylaxis   Amoxicillin Hives, Rash and Other (See Comments)    Blisters Has patient had a PCN reaction causing immediate rash, facial/tongue/throat swelling, SOB or lightheadedness with hypotension: No Has patient had a PCN reaction causing severe rash involving mucus membranes or skin necrosis: Yes Has patient had a PCN reaction that required hospitalization: No Has patient had a PCN reaction occurring within the last 10 years: Unknown If all of the above answers are "NO", then may proceed with  Cephalosporin use.    Ciprofloxacin Hives    blisters   Penicillins Rash and Other (See Comments)    Has patient had a PCN reaction causing immediate rash, facial/tongue/throat swelling, SOB or lightheadedness with hypotension:no Has patient had a PCN reaction causing severe rash involving mucus membranes or skin necrosis: yes Has patient had a PCN reaction that required hospitalization no Has patient had a PCN reaction occurring within the last 10 years: yes If all of the above answers are "NO", then may proceed with Cephalosporin use.    Tramadol Itching and Hives   Cranberry Rash   Latex Rash and Hives   Sulfa Antibiotics Rash and Other (See Comments)    BLISTERS   Medications Reviewed Today     Reviewed by Gayla Medicus, RN (Registered Nurse) on 11/28/21 at 25  Med List Status: <None>   Medication Order Taking? Sig Documenting Provider Last Dose Status Informant  Accu-Chek Softclix Lancets lancets [id number] No 1 each by Other route 4 (four) times daily. Use as instructed Malachy Mood, MD Taking Active   Blood Glucose Monitoring Suppl (ACCU-CHEK GUIDE) w/Device KIT [id number] No 1 kit by Does not apply route as directed. Malachy Mood, MD Taking Active   glucose blood (ACCU-CHEK GUIDE) test strip [id number] No Use as instructed Malachy Mood, MD Taking Active   ondansetron (ZOFRAN ODT) 4 MG disintegrating tablet [id number] No Take 1 tablet (4 mg total) by mouth every 8 (eight) hours as needed.  Patient not taking: Reported on 10/22/2021   Menshew, Dannielle Karvonen, PA-C Not Taking Active   polyethylene glycol powder (MIRALAX) 17 GM/SCOOP powder [id number] No Take 9 g by mouth daily.  Patient not taking: Reported on 10/22/2021  Naaman Plummer, MD Not Taking Active Self  Prenatal Vit-Fe Fumarate-FA (MULTIVITAMIN-PRENATAL) 27-0.8 MG TABS tablet [id number] No Take 1 tablet by mouth daily at 12 noon. 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   Obesity affecting pregnancy in second trimester 06/26/2021   Diet controlled gestational diabetes mellitus (GDM) in second trimester 06/26/2021   Gestational diabetes mellitus (GDM), antepartum 05/22/2021   Contact with or exposure to varicella 05/07/2021   Supervision of high-risk pregnancy 04/12/2021   Obesity affecting pregnancy 04/12/2021   Left nephrolithiasis 03/30/2019   Nephrolithiasis 03/29/2019   Status post LEEP (loop electrosurgical excision procedure) of cervix 05/03/2018   Dysplasia of cervix, 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   Tobacco user 02/25/2018   Heart murmur 05/03/2012   URI, acute 05/03/2012   Pyelonephritis 12/02/2011   Conditions to be addressed/monitored per PCP order:   obstetric healthcare management needs S/P C-section 11/19/21, increased blood pressure  Care Plan : RN Care Manager Plan of Care  Updates made by Gayla Medicus, RN since 11/28/2021 12:00 AM     Problem: High Risk Pregnancy   Priority: High     Goal: Self-Management Plan Developed   Start Date: 09/23/2021  Expected End Date: 03/01/2022  Recent Progress: On track  Priority: High  Note:   Current Barriers:  Obstetric case management-delivered 11/19/21 by C-section-full term.  Baby had open heart surgery within 48 hours of delivery due to cardiac anomalies and doing well per patient.  Patient no longer required to check blood sugars.  Patient is checking blood pressure once a day now due to increased blood pressure since delivery-has follow up appointments scheduled.  BP 140/90 today-discussed pre eclampsia symptoms.  RNCM Clinical Goal(s):  Patient will verbalize understanding of plan for management of pre eclampsia as evidenced  by patient report verbalize basic understanding of pre eclampsia  disease process and self health management plan as evidenced by patient report take all medications exactly as prescribed and will call provider for medication related questions as evidenced by patient report attend all scheduled medical appointments as evidenced by patient report continue to work with RN Care Manager to address care management and care coordination needs related to  GDM as evidenced by adherence to CM Team Scheduled appointments through collaboration with RN Care manager, provider, and care team.  Patient will check blood pressure every day and call for readings outside of parameters.  Interventions: Inter-disciplinary care team collaboration (see longitudinal plan of care) Evaluation of current treatment plan related to  self management and patient's adherence to plan as established by provider  Gestational Diabetes Placental Abnormality  Recommended adequate rest     and Reviewed prescribed diet and encouraged optimizing nutrition during pregnancy  Pre-eclampsia Discussed signs and symptoms      Patient Goals/Self-Care Activities: Take all medications as prescribed Attend all scheduled provider appointments Call pharmacy for medication refills 3-7 days in advance of running out of medications Perform all self care activities independently  Perform IADL's (shopping, preparing meals, housekeeping, managing finances) independently Call provider office for new concerns or questions   Follow Up Plan:  The patient has been provided with contact information for the care  management team and has been advised to call with any health related questions or concerns.  The care management team will reach out to the patient again over the next 30 days.    Goal: Establish Plan of Care for High Risk Pregnancy   Start Date: 09/23/2021  Expected End Date: 12/24/2021  Priority: High  Note:   Timeframe:  Short-Term  Goal Priority:  High Start Date:       09/23/21                      Expected End Date:  ongoing                     Follow Up Date 12/30/21   - schedule appointment for flu shot - schedule appointment for vaccines needed due to my age or health - schedule recommended health tests (blood work, mammogram, colonoscopy, pap test) - schedule and keep appointment for annual check-up    Why is this important?   Screening tests can find diseases early when they are easier to treat.  Your doctor or nurse will talk with you about which tests are important for you.  Getting shots for common diseases like the flu and shingles will help prevent them.   11/28/21:  Patient delivered by C-section 11/19/21-follow up appointments scheduled   Follow Up:  Patient agrees to Care Plan and Follow-up.  Plan: The Managed Medicaid care management team will reach out to the patient again over the next 30 days. and The  Patient has been provided with contact information for the Managed Medicaid care management team and has been advised to call with any health related questions or concerns.  Date/time of next scheduled RN care management/care coordination outreach:  12/30/21 at [phone].

## 2021-12-12 DIAGNOSIS — Z419 Encounter for procedure for purposes other than remedying health state, unspecified: Secondary | ICD-10-CM | POA: Diagnosis not present

## 2021-12-30 ENCOUNTER — Other Ambulatory Visit: Payer: Self-pay | Admitting: Obstetrics and Gynecology

## 2021-12-30 NOTE — Patient Instructions (Signed)
Hi Ms. Drenning, thanks for speaking with me-glad you and baby are doing well-have a great week!  Ms. Miyazaki was given information about Medicaid Managed Care team care coordination services as a part of their [facility] Medicaid benefit. Murlean Hark verbally consented to engagement with the [facility] Managed Care team.   If you are experiencing a medical emergency, please call 911 or report to your local emergency department or urgent care.   If you have a non-emergency medical problem during routine business hours, please contact your provider's office and ask to speak with a nurse.   For questions related to your [facility] health plan, please call: [phone] or go here:[email or web address]  If you would like to schedule transportation through your [facility] plan, please call the following number at least 2 days in advance of your appointment: [phone].  You can also use the MTM portal or MTM mobile app to manage your rides. For the portal, please go to [email or web address]  Call the [facility] Crisis Line at [phone], at any time, 24 hours a day, 7 days a week. If you are in danger or need immediate medical attention call 911.  If you would like help to quit smoking, call 1-800-QUIT-NOW (904-342-3637) OR Espaol: 1-855-Djelo-Ya (0-093-818-2993) o para ms informacin haga clic aqu or Text READY to 716-967 to register via text  Ms. Vear Clock - following are the goals we discussed in your visit today:   Goals Addressed    Goal: Establish Plan of Care for High Risk Pregnancy   Start Date: 09/23/2021  Expected End Date: 04/01/2022  Priority: High  Note:   Timeframe:  Short-Term Goal  Priority:  High Start Date:       09/23/21                      Expected End Date:  ongoing                     Follow Up Date 02/05/22   - schedule appointment for flu shot - schedule appointment for vaccines needed due to my age or health - schedule  recommended health tests (blood work, mammogram, colonoscopy, pap test) - schedule and keep appointment for annual check-up    Why is this important?   Screening tests can find diseases early when they are easier to treat.  Your doctor or nurse will talk with you about which tests are important for you.  Getting shots for common diseases like the flu and shingles will help prevent them.   12/30/21:  6 week check up scheduled with OB next week.   Patient verbalizes understanding of instructions and care plan provided today and agrees to view in MyChart. Active MyChart status and patient understanding of how to access instructions and care plan via MyChart confirmed with patient.     The Managed Medicaid care management team will reach out to the patient again over the next 30 business  days.  The  Patient has been provided with contact information for the Managed Medicaid care management team and has been advised to call with any health related questions or concerns.   Kathi Der RN, BSN [place]  [facility] Care Management Coordinator - Managed Medicaid High Risk [phone]   Following is a copy of your plan of care:  Care Plan : RN Care Manager Plan of Care  Updates made by Danie Chandler, RN since 12/30/2021 12:00 AM     Problem:  High Risk Pregnancy   Priority: High     Goal: Self-Management Plan Developed   Start Date: 09/23/2021  Expected End Date: 03/01/2022  Recent Progress: On track  Priority: High  Note:   Current Barriers:  Obstetric case management-delivered 11/19/21 by C-section-full term.  Baby had open heart surgery within 48 hours of delivery due to cardiac anomalies and doing well per patient.  Patient no longer required to check blood sugars.  Patient is checking blood pressure once a day now due to increased blood pressure since delivery-has follow up appointments scheduled.   12/30/21: Patient has 6 week post partum visit scheduled for next week.   Baby was able to come home from the hospital at 6 weeks and doing okay.  Patient denies any feelings of post partum depression.  Continues to check her BP once a day, 120s-140s/80s.  Denies any headaches, visual changes or epigastric pain.  Smokes 1/2 ppd, has decreased from 2ppd-praise given.   RNCM Clinical Goal(s):  Patient will verbalize understanding of plan for management of pre eclampsia as evidenced by patient report verbalize basic understanding of pre eclampsia  disease process and self health management plan as evidenced by patient report take all medications exactly as prescribed and will call provider for medication related questions as evidenced by patient report attend all scheduled medical appointments as evidenced by patient report continue to work with RN Care Manager to address care management and care coordination needs related to  GDM as evidenced by adherence to CM Team Scheduled appointments through collaboration with RN Care manager, provider, and care team.  Patient will check blood pressure every day and call for readings outside of parameters.  Interventions: Inter-disciplinary care team collaboration (see longitudinal plan of care) Evaluation of current treatment plan related to  self management and patient's adherence to plan as established by provider  Gestational Diabetes Placental Abnormality  Recommended adequate rest     and Reviewed prescribed diet and encouraged optimizing nutrition during pregnancy  Pre-eclampsia Discussed signs and symptoms      Patient Goals/Self-Care Activities: Take all medications as prescribed Attend all scheduled provider appointments Call pharmacy for medication refills 3-7 days in advance of running out of medications Perform all self care activities independently  Perform IADL's (shopping, preparing meals, housekeeping, managing finances) independently Call provider office for new concerns or questions   Follow Up Plan:  The  patient has been provided with contact information for the care management team and has been advised to call with any health related questions or concerns.  The care management team will reach out to the patient again over the next 30 business  days.

## 2021-12-30 NOTE — Patient Outreach (Signed)
Medicaid Managed Care   Nurse Care Manager Note  12/30/2021 Name:  Brooke Aguirre MRN:  [id number] DOB:  25-Jun-1996  Brooke Aguirre is an 26 y.o. year old female who is a primary patient of [facility] Coordination team was consulted for assistance with:    Obstetrics healthcare management needs  [name] was given information about Medicaid Managed Care Coordination team services today. Brooke Aguirre Patient agreed to services and verbal consent obtained.  Engaged with patient by telephone for follow up visit in response to provider referral for case management and/or care coordination services.   Assessments/Interventions:  Review of past medical history, allergies, medications, health status, including review of consultants reports, laboratory and other test data, was performed as part of comprehensive evaluation and provision of chronic care management services.  SDOH (Social Determinants of Health) assessments and interventions performed: SDOH Interventions    Flowsheet Row Most Recent Value  SDOH Interventions   Financial Strain Interventions Intervention Not Indicated  Social Connections Interventions Intervention Not Indicated     Care Plan  Allergies  Allergen Reactions   Kiwi Extract Anaphylaxis   Amoxicillin Hives, Rash and Other (See Comments)    Blisters Has patient had a PCN reaction causing immediate rash, facial/tongue/throat swelling, SOB or lightheadedness with hypotension: No Has patient had a PCN reaction causing severe rash involving mucus membranes or skin necrosis: Yes Has patient had a PCN reaction that required hospitalization: No Has patient had a PCN reaction occurring within the last 10 years: Unknown If all of the above answers are "NO", then may proceed with Cephalosporin use.    Ciprofloxacin Hives    blisters   Penicillins Rash and Other (See Comments)    Has patient had a PCN reaction causing  immediate rash, facial/tongue/throat swelling, SOB or lightheadedness with hypotension:no Has patient had a PCN reaction causing severe rash involving mucus membranes or skin necrosis: yes Has patient had a PCN reaction that required hospitalization no Has patient had a PCN reaction occurring within the last 10 years: yes If all of the above answers are "NO", then may proceed with Cephalosporin use.    Tramadol Itching and Hives   Cranberry Rash   Latex Rash and Hives   Sulfa Antibiotics Rash and Other (See Comments)    BLISTERS   Medications Reviewed Today     Reviewed by Gayla Medicus, RN (Registered Nurse) on 12/30/21 at 26  Med List Status: <None>   Medication Order Taking? Sig Documenting Provider Last Dose Status Informant  Accu-Chek Softclix Lancets lancets [id number] No 1 each by Other route 4 (four) times daily. Use as instructed  Patient not taking: Reported on 12/30/2021   Malachy Mood, MD Not Taking Active   Blood Glucose Monitoring Suppl (ACCU-CHEK GUIDE) w/Device KIT [id number] No 1 kit by Does not apply route as directed.  Patient not taking: Reported on 12/30/2021   Malachy Mood, MD Not Taking Active   glucose blood (ACCU-CHEK GUIDE) test strip [id number] No Use as instructed  Patient not taking: Reported on 12/30/2021   Malachy Mood, MD Not Taking Active   ondansetron (ZOFRAN ODT) 4 MG disintegrating tablet [id number]  Take 1 tablet (4 mg total) by mouth every 8 (eight) hours as needed.  Patient not taking: Reported on 10/22/2021   Menshew, Dannielle Karvonen, PA-C  Active   polyethylene glycol powder (MIRALAX) 17 GM/SCOOP powder [id number]  Take 9 g by mouth daily.  Patient not taking:  Reported on 10/22/2021   Naaman Plummer, MD  Active Self  Prenatal Vit-Fe Fumarate-FA (MULTIVITAMIN-PRENATAL) 27-0.8 MG TABS tablet [id number]  Take 1 tablet by mouth daily at 12 noon. [provider]  Active     Discontinued 11/13/20 0527            Patient Active  Problem List   Diagnosis Date Noted   Non-reassuring electronic fetal monitoring tracing 10/10/2021   Decreased fetal movement 10/09/2021   Supervision of other high risk pregnancies, third trimester 38/25/0539   Single umbilical artery 76/73/4193   Obesity affecting pregnancy in second trimester 06/26/2021   Diet controlled gestational diabetes mellitus (GDM) in second trimester 06/26/2021   Gestational diabetes mellitus (GDM), antepartum 05/22/2021   Contact with or exposure to varicella 05/07/2021   Supervision of high-risk pregnancy 04/12/2021   Obesity affecting pregnancy 04/12/2021   Left nephrolithiasis 03/30/2019   Nephrolithiasis 03/29/2019   Status post LEEP (loop electrosurgical excision procedure) of cervix 05/03/2018   Dysplasia of cervix, 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   Tobacco user 02/25/2018   Heart murmur 05/03/2012   URI, acute 05/03/2012   Pyelonephritis 12/02/2011   Conditions to be addressed/monitored per PCP order:   obstetric healthcare management needs, gestational hypertension  Care Plan : RN Care Manager Plan of Care  Updates made by Gayla Medicus, RN since 12/30/2021 12:00 AM     Problem: High Risk Pregnancy   Priority: High     Goal: Self-Management Plan Developed   Start Date: 09/23/2021  Expected End Date: 03/01/2022  Recent Progress: On track  Priority: High  Note:   Current Barriers:  Obstetric case management-delivered 11/19/21 by C-section-full term.  Baby had open heart surgery within 48 hours of delivery due to cardiac anomalies and doing well per patient.  Patient no longer required to check blood sugars.  Patient is checking blood pressure once a day now due to increased blood pressure since delivery-has follow up appointments scheduled.   12/30/21: Patient has 6 week post partum visit scheduled for next week.  Baby was able to come home from the hospital at 6 weeks and doing okay.  Patient denies any feelings of post partum depression.  Continues  to check her BP once a day, 120s-140s/80s.  Denies any headaches, visual changes or epigastric pain.  Smokes 1/2 ppd, has decreased from 2ppd-praise given.   RNCM Clinical Goal(s):  Patient will verbalize understanding of plan for management of pre eclampsia as evidenced by patient report verbalize basic understanding of pre eclampsia  disease process and self health management plan as evidenced by patient report take all medications exactly as prescribed and will call provider for medication related questions as evidenced by patient report attend all scheduled medical appointments as evidenced by patient report continue to work with RN Care Manager to address care management and care coordination needs related to  GDM as evidenced by adherence to CM Team Scheduled appointments through collaboration with RN Care manager, provider, and care team.  Patient will check blood pressure every day and call for readings outside of parameters.  Interventions: Inter-disciplinary care team collaboration (see longitudinal plan of care) Evaluation of current treatment plan related to  self management and patient's adherence to plan as established by provider  Gestational Diabetes Placental Abnormality  Recommended adequate rest     and Reviewed prescribed diet and encouraged optimizing nutrition during pregnancy  Pre-eclampsia Discussed signs and symptoms      Patient Goals/Self-Care Activities: Take all medications as  prescribed Attend all scheduled provider appointments Call pharmacy for medication refills 3-7 days in advance of running out of medications Perform all self care activities independently  Perform IADL's (shopping, preparing meals, housekeeping, managing finances) independently Call provider office for new concerns or questions   Follow Up Plan:  The patient has been provided with contact information for the care management team and has been advised to call with any health related  questions or concerns.  The care management team will reach out to the patient again over the next 30 business  days.    Goal: Establish Plan of Care for High Risk Pregnancy   Start Date: 09/23/2021  Expected End Date: 04/01/2022  Priority: High  Note:   Timeframe:  Short-Term Goal  Priority:  High Start Date:       09/23/21                      Expected End Date:  ongoing                     Follow Up Date 02/05/22   - schedule appointment for flu shot - schedule appointment for vaccines needed due to my age or health - schedule recommended health tests (blood work, mammogram, colonoscopy, pap test) - schedule and keep appointment for annual check-up    Why is this important?   Screening tests can find diseases early when they are easier to treat.  Your doctor or nurse will talk with you about which tests are important for you.  Getting shots for common diseases like the flu and shingles will help prevent them.   12/30/21:  6 week check up scheduled with OB next week.   Follow Up:  Patient agrees to Care Plan and Follow-up.  Plan: The Managed Medicaid care management team will reach out to the patient again over the next 30 business  days. and The  Patient has been provided with contact information for the Managed Medicaid care management team and has been advised to call with any health related questions or concerns.  Date/time of next scheduled RN care management/care coordination outreach:  02/05/22 at 1230.

## 2022-01-01 DIAGNOSIS — Z1331 Encounter for screening for depression: Secondary | ICD-10-CM | POA: Diagnosis not present

## 2022-01-11 DIAGNOSIS — Z419 Encounter for procedure for purposes other than remedying health state, unspecified: Secondary | ICD-10-CM | POA: Diagnosis not present

## 2022-02-05 ENCOUNTER — Other Ambulatory Visit: Payer: Self-pay | Admitting: Obstetrics and Gynecology

## 2022-02-05 NOTE — Patient Instructions (Signed)
Hi Ms. Kelsay, I am sorry I missed you today, I hope you are doing okay- as a part of your Medicaid benefit, you are eligible for care management and care coordination services at no cost or copay. I was unable to reach you by phone today but would be happy to help you with your health related needs. Please feel free to call me at [phone].  A member of the Managed Medicaid care management team will reach out to you again over the next 30 business  days.   Kathi Der RN, BSN [place]  [employer] - Managed Medicaid High Risk [phone]

## 2022-02-05 NOTE — Patient Outreach (Signed)
Care Coordination  02/05/2022  Brooke Aguirre 18-Oct-1995 [id number]   Medicaid Managed Care   Unsuccessful Outreach Note  02/05/2022 Name: Brooke Aguirre MRN: [id number] DOB: October 26, 1995  Referred by: [facility] Reason for referral : High Risk Managed Medicaid (Unsuccessful telephone outreach)  An unsuccessful telephone outreach was attempted today. The patient was referred to the case management team for assistance with care management and care coordination.   Follow Up Plan: The care management team will reach out to the patient again over the next 30 business  days.   Kathi Der RN, BSN [place]  [employer] - Managed Medicaid High Risk [phone]

## 2022-02-11 DIAGNOSIS — Z419 Encounter for procedure for purposes other than remedying health state, unspecified: Secondary | ICD-10-CM | POA: Diagnosis not present

## 2022-03-13 ENCOUNTER — Other Ambulatory Visit: Payer: Self-pay | Admitting: Obstetrics and Gynecology

## 2022-03-13 DIAGNOSIS — O24410 Gestational diabetes mellitus in pregnancy, diet controlled: Secondary | ICD-10-CM

## 2022-03-13 NOTE — Patient Instructions (Signed)
Hey Brooke Aguirre, great to catch up with you-I am glad you and baby are doing well.  Brooke Aguirre was given information about Medicaid Managed Care team care coordination services as a part of their [facility] Medicaid benefit. Brooke Aguirre verbally consented to engagement with the [facility] Managed Care team.   If you are experiencing a medical emergency, please call 911 or report to your local emergency department or urgent care.   If you have a non-emergency medical problem during routine business hours, please contact your provider's office and ask to speak with a nurse.   For questions related to your [facility] health plan, please call: [phone] or go here:[email or web address]  If you would like to schedule transportation through your [facility] plan, please call the following number at least 2 days in advance of your appointment: [phone].  You can also use the MTM portal or MTM mobile app to manage your rides. For the portal, please go to [email or web address]  Call the [facility] Crisis Line at [phone], at any time, 24 hours a day, 7 days a week. If you are in danger or need immediate medical attention call 911.  If you would like help to quit smoking, call 1-800-QUIT-NOW (908-727-5855) OR Espaol: 1-855-Djelo-Ya (4-627-035-0093) o para ms informacin haga clic aqu or Text READY to 818-299 to register via text  Brooke Aguirre - following are the goals we discussed in your visit today:   Goals Addressed    Timeframe:  Short-Term Goal Priority:  High Start Date:       09/23/21                      Expected End Date:  ongoing                     Follow Up Date 04/16/22   - schedule appointment for flu shot - schedule appointment for vaccines needed due to my age or health - schedule recommended health tests (blood work, mammogram, colonoscopy, pap test) - schedule and keep appointment for annual check-up    Why is this important?    Screening tests can find diseases early when they are easier to treat.  Your doctor or nurse will talk with you about which tests are important for you.  Getting shots for common diseases like the flu and shingles will help prevent them.   03/13/22:  Postpartum exam completed 01/01/22.  To schedule an appt with PCP for BP check and GYN for BTL.  Patient verbalizes understanding of instructions and care plan provided today and agrees to view in MyChart. Active MyChart status and patient understanding of how to access instructions and care plan via MyChart confirmed with patient.     The Managed Medicaid care management team will reach out to the patient again over the next 30 business  days.  The  Patient  has been provided with contact information for the Managed Medicaid care management team and has been advised to call with any health related questions or concerns.   Brooke Der RN, BSN [place]  [facility] Care Management Coordinator - Managed Medicaid High Risk [phone]   Following is a copy of your plan of care:  Care Plan : RN Care Manager Plan of Care  Updates made by Danie Chandler, RN since 03/13/2022 12:00 AM     Problem: High Risk Pregnancy   Priority: High     Goal: Self-Management Plan Developed   Start  Date: 09/23/2021  Expected End Date: 06/13/2022  Recent Progress: On track  Priority: High  Note:   Current Barriers:  Obstetric case management-delivered 11/19/21 by C-section-full term.  Baby had open heart surgery within 48 hours of delivery due to cardiac anomalies and doing well per patient.  Patient no longer required to check blood sugars.  03/13/22:  Patient states baby is doing well.  Patient checks BP occasionally, does not recall systolic readings, diastolic readings upper 80s per patients-asymptomatic at this time.  Patient to see PCP for f/u regarding BP, needs resources and will refer.  Post partum exam completed 01/01/22, plans on scheduling  an appt for BTL.   Continues to smoke up to 1ppd.  RNCM Clinical Goal(s):  Patient will verbalize understanding of plan for management of pre eclampsia as evidenced by patient report verbalize basic understanding of pre eclampsia  disease process and self health management plan as evidenced by patient report take all medications exactly as prescribed and will call provider for medication related questions as evidenced by patient report attend all scheduled medical appointments as evidenced by patient report continue to work with RN Care Manager to address care management and care coordination needs related to  GDM as evidenced by adherence to CM Team Scheduled appointments through collaboration with RN Care manager, provider, and care team.  Patient will schedule an appt with PCP.  Interventions: Inter-disciplinary care team collaboration (see longitudinal plan of care) Evaluation of current treatment plan related to  self management and patient's adherence to plan as established by provider Collaborated with Care Guide for PCP resources Care Guide referral for PCP  Gestational Diabetes Placental Abnormality  Recommended adequate rest     and Reviewed prescribed diet and encouraged optimizing nutrition during pregnancy  Pre-eclampsia Discussed signs and symptoms      Patient Goals/Self-Care Activities: Take all medications as prescribed Attend all scheduled provider appointments Call pharmacy for medication refills 3-7 days in advance of running out of medications Perform all self care activities independently  Perform IADL's (shopping, preparing meals, housekeeping, managing finances) independently Call provider office for new concerns or questions   Follow Up Plan:  The patient has been provided with contact information for the care management team and has been advised to call with any health related questions or concerns.  The care management team will reach out to the patient again  over the next 30 business  days.

## 2022-03-13 NOTE — Patient Outreach (Signed)
Medicaid Managed Care   Nurse Care Manager Note  03/13/2022 Name:  Brooke Aguirre MRN:  [id number] DOB:  12-19-1995  Brooke Aguirre is an 26 y.o. year old female who is a primary patient of [facility] Coordination team was consulted for assistance with:    Healthcare management needs, labile BP, tobacco use  Ms. Pfefferle was given information about Medicaid Managed Care Coordination team services today. Brooke Aguirre Patient agreed to services and verbal consent obtained.  Engaged with patient by telephone for follow up visit in response to provider referral for case management and/or care coordination services.   Assessments/Interventions:  Review of past medical history, allergies, medications, health status, including review of consultants reports, laboratory and other test data, was performed as part of comprehensive evaluation and provision of chronic care management services.  SDOH (Social Determinants of Health) assessments and interventions performed: SDOH Interventions    Flowsheet Row Most Recent Value  SDOH Interventions   Food Insecurity Interventions Intervention Not Indicated      Care Plan  Allergies  Allergen Reactions   Kiwi Extract Anaphylaxis   Amoxicillin Hives, Rash and Other (See Comments)    Blisters Has patient had a PCN reaction causing immediate rash, facial/tongue/throat swelling, SOB or lightheadedness with hypotension: No Has patient had a PCN reaction causing severe rash involving mucus membranes or skin necrosis: Yes Has patient had a PCN reaction that required hospitalization: No Has patient had a PCN reaction occurring within the last 10 years: Unknown If all of the above answers are "NO", then may proceed with Cephalosporin use.    Ciprofloxacin Hives    blisters   Penicillins Rash and Other (See Comments)    Has patient had a PCN reaction causing immediate rash, facial/tongue/throat swelling, SOB  or lightheadedness with hypotension:no Has patient had a PCN reaction causing severe rash involving mucus membranes or skin necrosis: yes Has patient had a PCN reaction that required hospitalization no Has patient had a PCN reaction occurring within the last 10 years: yes If all of the above answers are "NO", then may proceed with Cephalosporin use.    Tramadol Itching and Hives   Cranberry Rash   Latex Rash and Hives   Sulfa Antibiotics Rash and Other (See Comments)    BLISTERS   Medications Reviewed Today     Reviewed by Gayla Medicus, RN (Registered Nurse) on 03/13/22 at 1531  Med List Status: <None>   Medication Order Taking? Sig Documenting Provider Last Dose Status Informant  Accu-Chek Softclix Lancets lancets [id number]  1 each by Other route 4 (four) times daily. Use as instructed  Patient not taking: Reported on 12/30/2021   Malachy Mood, MD  Active   Blood Glucose Monitoring Suppl (ACCU-CHEK GUIDE) w/Device KIT [id number]  1 kit by Does not apply route as directed.  Patient not taking: Reported on 12/30/2021   Malachy Mood, MD  Active   glucose blood (ACCU-CHEK GUIDE) test strip [id number]  Use as instructed  Patient not taking: Reported on 12/30/2021   Malachy Mood, MD  Active   ondansetron (ZOFRAN ODT) 4 MG disintegrating tablet [id number]  Take 1 tablet (4 mg total) by mouth every 8 (eight) hours as needed.  Patient not taking: Reported on 10/22/2021   Menshew, Dannielle Karvonen, PA-C  Active   polyethylene glycol powder (MIRALAX) 17 GM/SCOOP powder [id number]  Take 9 g by mouth daily.  Patient not taking: Reported on 10/22/2021   Cheri Fowler,  Vista Lawman, MD  Active Self  Prenatal Vit-Fe Fumarate-FA (MULTIVITAMIN-PRENATAL) 27-0.8 MG TABS tablet [id number] No Take 1 tablet by mouth daily at 12 noon.  Patient not taking: Reported on 03/13/2022   [provider] Not Taking Active     Discontinued 11/13/20 0527            Patient Active Problem List   Diagnosis  Date Noted   Non-reassuring electronic fetal monitoring tracing 10/10/2021   Decreased fetal movement 10/09/2021   Supervision of other high risk pregnancies, third trimester 78/58/8502   Single umbilical artery 77/41/2878   Obesity affecting pregnancy in second trimester 06/26/2021   Diet controlled gestational diabetes mellitus (GDM) in second trimester 06/26/2021   Gestational diabetes mellitus (GDM), antepartum 05/22/2021   Contact with or exposure to varicella 05/07/2021   Supervision of high-risk pregnancy 04/12/2021   Obesity affecting pregnancy 04/12/2021   Left nephrolithiasis 03/30/2019   Nephrolithiasis 03/29/2019   Status post LEEP (loop electrosurgical excision procedure) of cervix 05/03/2018   Dysplasia of cervix, 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   Tobacco user 02/25/2018   Heart murmur 05/03/2012   URI, acute 05/03/2012   Pyelonephritis 12/02/2011   Conditions to be addressed/monitored per PCP order:  Healthcare management needs, labile BP, tobacco use  Care Plan : RN Care Manager Plan of Care  Updates made by Gayla Medicus, RN since 03/13/2022 12:00 AM     Problem: High Risk Pregnancy   Priority: High     Goal: Self-Management Plan Developed   Start Date: 09/23/2021  Expected End Date: 06/13/2022  Recent Progress: On track  Priority: High  Note:   Current Barriers:  Obstetric case management-delivered 11/19/21 by C-section-full term.  Baby had open heart surgery within 48 hours of delivery due to cardiac anomalies and doing well per patient.  Patient no longer required to check blood sugars.  03/13/22:  Patient states baby is doing well.  Patient checks BP occasionally, does not recall systolic readings, diastolic readings upper 67E per patients-asymptomatic at this time.  Patient to see PCP for f/u regarding BP, needs resources and will refer.  Post partum exam completed 01/01/22, plans on scheduling an appt for BTL.   Continues to smoke up to 1ppd.  RNCM Clinical  Goal(s):  Patient will verbalize understanding of plan for management of pre eclampsia as evidenced by patient report verbalize basic understanding of pre eclampsia  disease process and self health management plan as evidenced by patient report take all medications exactly as prescribed and will call provider for medication related questions as evidenced by patient report attend all scheduled medical appointments as evidenced by patient report continue to work with RN Care Manager to address care management and care coordination needs related to  GDM as evidenced by adherence to CM Team Scheduled appointments through collaboration with RN Care manager, provider, and care team.  Patient will schedule an appt with PCP.  Interventions: Inter-disciplinary care team collaboration (see longitudinal plan of care) Evaluation of current treatment plan related to  self management and patient's adherence to plan as established by provider Collaborated with Care Guide for PCP resources Care Guide referral for PCP  Gestational Diabetes Placental Abnormality  Recommended adequate rest     and Reviewed prescribed diet and encouraged optimizing nutrition during pregnancy  Pre-eclampsia Discussed signs and symptoms      Patient Goals/Self-Care Activities: Take all medications as prescribed Attend all scheduled provider appointments Call pharmacy for medication refills 3-7 days in advance of running  out of medications Perform all self care activities independently  Perform IADL's (shopping, preparing meals, housekeeping, managing finances) independently Call provider office for new concerns or questions   Follow Up Plan:  The patient has been provided with contact information for the care management team and has been advised to call with any health related questions or concerns.  The care management team will reach out to the patient again over the next 30 business  days.    Goal: Establish Plan of Care  for High Risk Pregnancy   Priority: High  Note:   Timeframe:  Short-Term Goal Priority:  High Start Date:       09/23/21                      Expected End Date:  ongoing                     Follow Up Date 04/16/22   - schedule appointment for flu shot - schedule appointment for vaccines needed due to my age or health - schedule recommended health tests (blood work, mammogram, colonoscopy, pap test) - schedule and keep appointment for annual check-up    Why is this important?   Screening tests can find diseases early when they are easier to treat.  Your doctor or nurse will talk with you about which tests are important for you.  Getting shots for common diseases like the flu and shingles will help prevent them.   03/13/22:  Postpartum exam completed 01/01/22.  To schedule an appt with PCP for BP check and GYN for BTL.   Follow Up:  Patient agrees to Care Plan and Follow-up.  Plan: The Managed Medicaid care management team will reach out to the patient again over the next 30 business  days. and The  Patient has been provided with contact information for the Managed Medicaid care management team and has been advised to call with any health related questions or concerns.  Date/time of next scheduled RN care management/care coordination outreach:  04/16/22 at 1230.

## 2022-03-14 DIAGNOSIS — Z419 Encounter for procedure for purposes other than remedying health state, unspecified: Secondary | ICD-10-CM | POA: Diagnosis not present

## 2022-03-19 ENCOUNTER — Telehealth: Payer: Self-pay

## 2022-03-19 NOTE — Telephone Encounter (Signed)
   Telephone encounter was:  Unsuccessful.  03/19/2022 Name: Brooke Aguirre MRN: [id number] DOB: [birth year]  Unsuccessful outbound call made today to assist with:   [employer] Managed Medicaid PCP list.  Outreach Attempt:  1st Attempt  A HIPAA compliant voice message was left requesting a return call.  Instructed patient to call back at [phone].  Latifah Padin [facility]  [facility] Population Health Community Resource Care Guide   [email or web address]  ?? [phone]   Website: [email or web address]  [email or web address]  "We don't say no, we SHOW how!"         The [facility] Health Department

## 2022-03-21 ENCOUNTER — Telehealth: Payer: Self-pay

## 2022-03-21 NOTE — Telephone Encounter (Signed)
   Telephone encounter was:  Successful.  03/21/2022 Name: Brooke Aguirre MRN: [id number] DOB: Jun 14, 1996  Brooke Aguirre is a 26 y.o. year old female who is a primary care patient of [facility] The community resource team was consulted for assistance with  [facility] Medicaid PCP list.  Care guide performed the following interventions: Spoke with patient, verified email address sent [facility] Managed Medicaid PCP list. [name] has my name and number to call if she does not receive the email.   Follow Up Plan:  No further follow up planned at this time. The patient has been provided with needed resources.  Shakema Surita [facility]  [facility] Population Health Community Resource Care Guide   [email or web address]  ?? [phone]   Website: [email or web address]  [email or web address]  "We don't say no, we SHOW how!"         The [facility] Health Department

## 2022-04-13 DIAGNOSIS — Z419 Encounter for procedure for purposes other than remedying health state, unspecified: Secondary | ICD-10-CM | POA: Diagnosis not present

## 2022-04-16 ENCOUNTER — Other Ambulatory Visit: Payer: Self-pay | Admitting: Obstetrics and Gynecology

## 2022-04-16 NOTE — Patient Instructions (Signed)
Hi Ms. Shiffer, glad your son is doing well-have a nice afternoon!  Ms. Morden was given information about Medicaid Managed Care team care coordination services as a part of their [facility] Medicaid benefit. [name] verbally consented to engagement with the [facility] Managed Care team.   If you are experiencing a medical emergency, please call 911 or report to your local emergency department or urgent care.   If you have a non-emergency medical problem during routine business hours, please contact your provider's office and ask to speak with a nurse.   For questions related to your [facility] health plan, please call: [phone] or go here:[email or web address]  If you would like to schedule transportation through your [facility] plan, please call the following number at least 2 days in advance of your appointment: [phone].  You can also use the MTM portal or MTM mobile app to manage your rides. For the portal, please go to [email or web address].  Call the [place] at [phone], at any time, 24 hours a day, 7 days a week. If you are in danger or need immediate medical attention call 911.  If you would like help to quit smoking, call 1-800-QUIT-NOW 2696572238) OR Espaol: 1-855-Djelo-Ya (6-606-301-6010) o para ms informacin haga clic aqu or Text READY to 200-400 to register via text  Ms. Hardin Negus - following are the goals we discussed in your visit today:   Goals Addressed    Timeframe:  Short-Term Goal Priority:  High Start Date:       09/23/21                      Expected End Date:  ongoing                     Follow Up Date 05/20/22   - schedule appointment for flu shot - schedule appointment for vaccines needed due to my age or health - schedule recommended health tests (blood work, mammogram, colonoscopy, pap test) - schedule and keep appointment for annual check-up    Why is this important?   Screening tests can  find diseases early when they are easier to treat.  Your doctor or nurse will talk with you about which tests are important for you.  Getting shots for common diseases like the flu and shingles will help prevent them.   04/16/22:  Patient to schedule BTL .  Patient verbalizes understanding of instructions and care plan provided today and agrees to view in [place]. Active MyChart status and patient understanding of how to access instructions and care plan via MyChart confirmed with patient.     The Managed Medicaid care management team will reach out to the patient again over the next 30  business  days.  The  Patient  has been provided with contact information for the Managed Medicaid care management team and has been advised to call with any health related questions or concerns.   Aida Raider RN, BSN [place] Management Coordinator - Managed Medicaid High Risk [phone]   Following is a copy of your plan of care:  Care Plan : [place] of Care  Updates made by Gayla Medicus, RN since 04/16/2022 12:00 AM     Problem: High Risk Pregnancy   Priority: High     Goal: Self-Management Plan Developed   Start Date: 09/23/2021  Expected End Date: 06/13/2022  Recent Progress: On track  Priority: High  Note:  Current Barriers:  Obstetric case management-delivered 11/19/21 by C-section-full term.  Baby had open heart surgery within 48 hours of delivery due to cardiac anomalies and doing well per patient.  Patient no longer required to check blood sugars.  04/16/22: Patient has received PCP resources.  Issues with insurance-provided with [place] Medicaid Ombudsman phone number.  Smoking less than 1ppd now-provided with 1800-QUITNOW number.  BP WNL per patient.  Plans on scheduling BTL.  RNCM Clinical Goal(s):  Patient will verbalize understanding of plan for management of pre eclampsia as evidenced by patient report verbalize basic understanding of pre  eclampsia  disease process and self health management plan as evidenced by patient report take all medications exactly as prescribed and will call provider for medication related questions as evidenced by patient report attend all scheduled medical appointments as evidenced by patient report continue to work with RN Care Manager to address care management and care coordination needs related to  GDM as evidenced by adherence to CM Team Scheduled appointments through collaboration with RN Care manager, provider, and care team.  Patient will schedule an appt with PCP.  Interventions: Inter-disciplinary care team collaboration (see longitudinal plan of care) Evaluation of current treatment plan related to  self management and patient's adherence to plan as established by provider Collaborated with Care Guide for PCP resources Care Guide referral for PCP-completed. Patient provided with [facility] Medicaid Ombudsman phone number for support.  Gestational Diabetes Placental Abnormality  Recommended adequate rest     and Reviewed prescribed diet and encouraged optimizing nutrition during pregnancy  Pre-eclampsia Discussed signs and symptoms      Patient Goals/Self-Care Activities: Take all medications as prescribed Attend all scheduled provider appointments Call pharmacy for medication refills 3-7 days in advance of running out of medications Perform all self care activities independently  Perform IADL's (shopping, preparing meals, housekeeping, managing finances) independently Call provider office for new concerns or questions   Follow Up Plan:  The patient has been provided with contact information for the care management team and has been advised to call with any health related questions or concerns.  The care management team will reach out to the patient again over the next 30 business  days.

## 2022-04-16 NOTE — Patient Outreach (Signed)
Medicaid Managed Care   Nurse Care Manager Note  04/16/2022 Name:  Brooke Aguirre MRN:  [id number] DOB:  07-27-1995  Brooke Aguirre is an 26 y.o. year old female who is a primary patient of [place] Coordination team was consulted for assistance with:    Healthcare management needs, tobacco use, h/o increased BP  Brooke Aguirre was given information about Medicaid Managed Care Coordination team services today. Brooke Aguirre Patient agreed to services and verbal consent obtained.  Engaged with patient by telephone for follow up visit in response to provider referral for case management and/or care coordination services.   Assessments/Interventions:  Review of past medical history, allergies, medications, health status, including review of consultants reports, laboratory and other test data, was performed as part of comprehensive evaluation and provision of chronic care management services.  SDOH (Social Determinants of Health) assessments and interventions performed: SDOH Interventions    Flowsheet Row Patient Outreach Telephone from 04/16/2022 in [place] Patient Outreach Telephone from 03/13/2022 in [place] Patient Outreach Telephone from 12/30/2021 in [place] Patient Outreach Telephone from 11/28/2021 in [place] Patient Outreach Telephone from 10/24/2021 in [place] Patient Outreach Telephone from 09/23/2021 in [place] Coordination  SDOH Interventions        Food Insecurity Interventions -- Intervention Not Indicated -- -- -- Intervention Not Indicated  Housing Interventions -- -- -- -- Intervention Not Indicated --  Utilities Interventions Intervention Not Indicated -- -- -- -- --  Alcohol Usage  Interventions Intervention Not Indicated (Score <7) -- -- -- -- --  Financial Strain Interventions -- -- Intervention Not Indicated -- -- --  Physical Activity Interventions -- -- -- Intervention Not Indicated  [patient has c-section 11/19/21] -- --  Stress Interventions -- -- -- Other (Comment)  [patient deliverd by c-section 11/19/21-baby is in the PICU at [facility] open heart surgery less than 48 hours after being born] -- --  Social Connections Interventions -- -- Intervention Not Indicated -- -- --     Care Plan  Allergies  Allergen Reactions   Kiwi Extract Anaphylaxis   Amoxicillin Hives, Rash and Other (See Comments)    Blisters Has patient had a PCN reaction causing immediate rash, facial/tongue/throat swelling, SOB or lightheadedness with hypotension: No Has patient had a PCN reaction causing severe rash involving mucus membranes or skin necrosis: Yes Has patient had a PCN reaction that required hospitalization: No Has patient had a PCN reaction occurring within the last 10 years: Unknown If all of the above answers are "NO", then may proceed with Cephalosporin use.    Ciprofloxacin Hives    blisters   Penicillins Rash and Other (See Comments)    Has patient had a PCN reaction causing immediate rash, facial/tongue/throat swelling, SOB or lightheadedness with hypotension:no Has patient had a PCN reaction causing severe rash involving mucus membranes or skin necrosis: yes Has patient had a PCN reaction that required hospitalization no Has patient had a PCN reaction occurring within the last 10 years: yes If all of the above answers are "NO", then may proceed with Cephalosporin use.    Tramadol Itching and Hives   Cranberry Rash   Latex Rash and Hives   Sulfa Antibiotics Rash and Other (See Comments)    BLISTERS   Medications Reviewed Today     Reviewed by  Gayla Medicus, RN (Registered Nurse) on 04/16/22 at 1300  Med List Status: <None>   Medication Order Taking? Sig  Documenting Provider Last Dose Status Informant  Accu-Chek Softclix Lancets lancets [id number] No 1 each by Other route 4 (four) times daily. Use as instructed  Patient not taking: Reported on 12/30/2021   Malachy Mood, MD Not Taking Active   Blood Glucose Monitoring Suppl (ACCU-CHEK GUIDE) w/Device KIT [id number] No 1 kit by Does not apply route as directed.  Patient not taking: Reported on 12/30/2021   Malachy Mood, MD Not Taking Active   glucose blood (ACCU-CHEK GUIDE) test strip [id number] No Use as instructed  Patient not taking: Reported on 12/30/2021   Malachy Mood, MD Not Taking Active   ondansetron (ZOFRAN ODT) 4 MG disintegrating tablet [id number] No Take 1 tablet (4 mg total) by mouth every 8 (eight) hours as needed.  Patient not taking: Reported on 10/22/2021   Menshew, Dannielle Karvonen, PA-C Not Taking Active   polyethylene glycol powder (MIRALAX) 17 GM/SCOOP powder [id number] No Take 9 g by mouth daily.  Patient not taking: Reported on 10/22/2021   Naaman Plummer, MD Not Taking Active Self  Prenatal Vit-Fe Fumarate-FA (MULTIVITAMIN-PRENATAL) 27-0.8 MG TABS tablet [id number] No Take 1 tablet by mouth daily at 12 noon.  Patient not taking: Reported on 03/13/2022   [provider] Not Taking Active     Discontinued 11/13/20 0527            Patient Active Problem List   Diagnosis Date Noted   Non-reassuring electronic fetal monitoring tracing 10/10/2021   Decreased fetal movement 10/09/2021   Supervision of other high risk pregnancies, third trimester 83/25/4982   Single umbilical artery 64/15/8309   Obesity affecting pregnancy in second trimester 06/26/2021   Diet controlled gestational diabetes mellitus (GDM) in second trimester 06/26/2021   Gestational diabetes mellitus (GDM), antepartum 05/22/2021   Contact with or exposure to varicella 05/07/2021   Supervision of high-risk pregnancy 04/12/2021   Obesity affecting pregnancy 04/12/2021   Left  nephrolithiasis 03/30/2019   Nephrolithiasis 03/29/2019   Status post LEEP (loop electrosurgical excision procedure) of cervix 05/03/2018   Dysplasia of cervix, 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   Tobacco user 02/25/2018   Heart murmur 05/03/2012   URI, acute 05/03/2012   Pyelonephritis 12/02/2011   Conditions to be addressed/monitored per PCP order:  Healthcare management needs, tobacco use, h/o increased BP  Care Plan : RN Care Manager Plan of Care  Updates made by Gayla Medicus, RN since 04/16/2022 12:00 AM     Problem: High Risk Pregnancy   Priority: High     Goal: Self-Management Plan Developed   Start Date: 09/23/2021  Expected End Date: 06/13/2022  Recent Progress: On track  Priority: High  Note:   Current Barriers:  Obstetric case management-delivered 11/19/21 by C-section-full term.  Baby had open heart surgery within 48 hours of delivery due to cardiac anomalies and doing well per patient.  Patient no longer required to check blood sugars.  04/16/22: Patient has received PCP resources.  Issues with insurance-provided with [place] Medicaid Ombudsman phone number.  Smoking less than 1ppd now-provided with [phone] number.  BP WNL per patient.  Plans on scheduling BTL.  RNCM Clinical Goal(s):  Patient will verbalize understanding of plan for management of pre eclampsia as evidenced by patient report verbalize basic understanding of pre eclampsia  disease process and self health management plan as evidenced by patient report take all medications exactly as prescribed  and will call provider for medication related questions as evidenced by patient report attend all scheduled medical appointments as evidenced by patient report continue to work with RN Care Manager to address care management and care coordination needs related to  GDM as evidenced by adherence to CM Team Scheduled appointments through collaboration with RN Care manager, provider, and care team.  Patient will schedule an appt  with PCP.  Interventions: Inter-disciplinary care team collaboration (see longitudinal plan of care) Evaluation of current treatment plan related to  self management and patient's adherence to plan as established by provider Collaborated with Care Guide for PCP resources Care Guide referral for PCP-completed. Patient provided with [facility] Medicaid Ombudsman phone number for support.  Gestational Diabetes Placental Abnormality  Recommended adequate rest     and Reviewed prescribed diet and encouraged optimizing nutrition during pregnancy  Pre-eclampsia Discussed signs and symptoms      Patient Goals/Self-Care Activities: Take all medications as prescribed Attend all scheduled provider appointments Call pharmacy for medication refills 3-7 days in advance of running out of medications Perform all self care activities independently  Perform IADL's (shopping, preparing meals, housekeeping, managing finances) independently Call provider office for new concerns or questions   Follow Up Plan:  The patient has been provided with contact information for the care management team and has been advised to call with any health related questions or concerns.  The care management team will reach out to the patient again over the next 30 business  days.    Goal: Establish Plan of Care for High Risk Pregnancy   Priority: High  Note:   Timeframe:  Short-Term Goal Priority:  High Start Date:       09/23/21                      Expected End Date:  ongoing                     Follow Up Date 05/20/22   - schedule appointment for flu shot - schedule appointment for vaccines needed due to my age or health - schedule recommended health tests (blood work, mammogram, colonoscopy, pap test) - schedule and keep appointment for annual check-up    Why is this important?   Screening tests can find diseases early when they are easier to treat.  Your doctor or nurse will talk with you about which tests are important  for you.  Getting shots for common diseases like the flu and shingles will help prevent them.   04/16/22:  Patient to schedule BTL .   Follow Up:  Patient agrees to Care Plan and Follow-up.  Plan: The Managed Medicaid care management team will reach out to the patient again over the next 30 business  days. and The  Patient has been provided with contact information for the Managed Medicaid care management team and has been advised to call with any health related questions or concerns.  Date/time of next scheduled RN care management/care coordination outreach: 05/20/22 at 1030.

## 2022-04-19 ENCOUNTER — Telehealth: Payer: Medicaid Other | Admitting: Physician Assistant

## 2022-04-19 DIAGNOSIS — J029 Acute pharyngitis, unspecified: Secondary | ICD-10-CM | POA: Diagnosis not present

## 2022-04-19 NOTE — Progress Notes (Signed)
E-Visit for Sore Throat   We are sorry that you are not feeling well.  Here is how we plan to help!   Your symptoms indicate a likely viral infection (Pharyngitis).   Pharyngitis is inflammation in the back of the throat which can cause a sore throat, scratchiness and sometimes difficulty swallowing.   Pharyngitis is typically caused by a respiratory virus and will just run its course.  Please keep in mind that your symptoms could last up to 10 days.  For throat pain, we recommend over the counter oral pain relief medications such as acetaminophen or aspirin, or anti-inflammatory medications such as ibuprofen or naproxen sodium.  Topical treatments such as oral throat lozenges or sprays may be used as needed.  Avoid close contact with loved ones, especially the very young and elderly.  Remember to wash your hands thoroughly throughout the day as this is the number one way to prevent the spread of infection and wipe down door knobs and counters with disinfectant.   After careful review of your answers, I would not recommend an antibiotic for your condition.  Antibiotics should not be used to treat conditions that we suspect are caused by viruses like the virus that causes the common cold or flu. However, some people can have Strep with atypical symptoms. You may need formal testing in clinic or office to confirm if your symptoms continue or worsen.   Providers prescribe antibiotics to treat infections caused by bacteria. Antibiotics are very powerful in treating bacterial infections when they are used properly.  To maintain their effectiveness, they should be used only when necessary.  Overuse of antibiotics has resulted in the development of super bugs that are resistant to treatment!     Home Care: Only take medications as instructed by your medical team. Do not drink alcohol while taking these medications. A steam or ultrasonic humidifier can help congestion.  You can place a towel over your head and  breathe in the steam from hot water coming from a faucet. Avoid close contacts especially the very young and the elderly. Cover your mouth when you cough or sneeze. Always remember to wash your hands.   Get Help Right Away If: You develop worsening fever or throat pain. You develop a severe head ache or visual changes. Your symptoms persist after you have completed your treatment plan.   Make sure you Understand these instructions. Will watch your condition. Will get help right away if you are not doing well or get worse.     Thank you for choosing an e-visit.   Your e-visit answers were reviewed by a board certified advanced clinical practitioner to complete your personal care plan. Depending upon the condition, your plan could have included both over the counter or prescription medications.   Please review your pharmacy choice. Make sure the pharmacy is open so you can pick up prescription now. If there is a problem, you may contact your provider through MyChart messaging and have the prescription routed to another pharmacy.  Your safety is important to us. If you have drug allergies check your prescription carefully.    For the next 24 hours you can use MyChart to ask questions about today's visit, request a non-urgent call back, or ask for a work or school excuse. You will get an email in the next two days asking about your experience. I hope that your e-visit has been valuable and will speed your recovery.  

## 2022-04-19 NOTE — Progress Notes (Signed)
I have spent 5 minutes in review of e-visit questionnaire, review and updating patient chart, medical decision making and response to patient.   Dafna Romo Cody Jajuan Skoog, PA-C    

## 2022-04-25 ENCOUNTER — Telehealth: Payer: Self-pay | Admitting: Family Medicine

## 2022-04-25 DIAGNOSIS — J40 Bronchitis, not specified as acute or chronic: Secondary | ICD-10-CM

## 2022-04-25 MED ORDER — AZITHROMYCIN 250 MG PO TABS: ORAL_TABLET | ORAL | 0 refills | Status: AC

## 2022-04-25 MED ORDER — PREDNISONE 20 MG PO TABS: 40.0000 mg | ORAL_TABLET | Freq: Every day | ORAL | 0 refills | Status: AC

## 2022-04-25 MED ORDER — BENZONATATE 200 MG PO CAPS: 200.0000 mg | ORAL_CAPSULE | Freq: Two times a day (BID) | ORAL | 0 refills | Status: DC | PRN

## 2022-04-25 MED ORDER — AZITHROMYCIN 250 MG PO TABS: ORAL_TABLET | ORAL | 0 refills | Status: DC

## 2022-04-25 MED ORDER — PREDNISONE 20 MG PO TABS: 40.0000 mg | ORAL_TABLET | Freq: Every day | ORAL | 0 refills | Status: DC

## 2022-04-25 NOTE — Progress Notes (Signed)
We are sorry that you are not feeling well.  Here is how we plan to help!  Based on your presentation I believe you most likely have A cough due to bacteria.  When patients have a fever and a productive cough with a change in color or increased sputum production, we are concerned about bacterial bronchitis.  If left untreated it can progress to pneumonia.  If your symptoms do not improve with your treatment plan it is important that you contact your provider.   I have prescribed Azithromyin 250 mg: two tablets now and then one tablet daily for 4 additonal days    In addition you may use A prescription cough medication called Tessalon Perles 100mg . You may take 1-2 capsules every 8 hours as needed for your cough.  Prednisone for 5 days.  From your responses in the eVisit questionnaire you describe inflammation in the upper respiratory tract which is causing a significant cough.  This is commonly called Bronchitis and has four common causes:   Allergies Viral Infections Acid Reflux Bacterial Infection Allergies, viruses and acid reflux are treated by controlling symptoms or eliminating the cause. An example might be a cough caused by taking certain blood pressure medications. You stop the cough by changing the medication. Another example might be a cough caused by acid reflux. Controlling the reflux helps control the cough.  USE OF BRONCHODILATOR ("RESCUE") INHALERS: There is a risk from using your bronchodilator too frequently.  The risk is that over-reliance on a medication which only relaxes the muscles surrounding the breathing tubes can reduce the effectiveness of medications prescribed to reduce swelling and congestion of the tubes themselves.  Although you feel brief relief from the bronchodilator inhaler, your asthma may actually be worsening with the tubes becoming more swollen and filled with mucus.  This can delay other crucial treatments, such as oral steroid medications. If you need to use  a bronchodilator inhaler daily, several times per day, you should discuss this with your provider.  There are probably better treatments that could be used to keep your asthma under control.     HOME CARE Only take medications as instructed by your medical team. Complete the entire course of an antibiotic. Drink plenty of fluids and get plenty of rest. Avoid close contacts especially the very young and the elderly Cover your mouth if you cough or cough into your sleeve. Always remember to wash your hands A steam or ultrasonic humidifier can help congestion.   GET HELP RIGHT AWAY IF: You develop worsening fever. You become short of breath You cough up blood. Your symptoms persist after you have completed your treatment plan MAKE SURE YOU  Understand these instructions. Will watch your condition. Will get help right away if you are not doing well or get worse.    Thank you for choosing an e-visit.  Your e-visit answers were reviewed by a board certified advanced clinical practitioner to complete your personal care plan. Depending upon the condition, your plan could have included both over the counter or prescription medications.  Please review your pharmacy choice. Make sure the pharmacy is open so you can pick up prescription now. If there is a problem, you may contact your provider through and have the prescription routed to another pharmacy.  Your safety is important to [employer]. If you have drug allergies check your prescription carefully.   For the next 24 hours you can use MyChart to ask questions about today's visit, request a non-urgent  call back, or ask for a work or school excuse. You will get an email in the next two days asking about your experience. I hope that your e-visit has been valuable and will speed your recovery.

## 2022-05-14 DIAGNOSIS — Z419 Encounter for procedure for purposes other than remedying health state, unspecified: Secondary | ICD-10-CM | POA: Diagnosis not present

## 2022-05-20 ENCOUNTER — Other Ambulatory Visit: Payer: Self-pay | Admitting: Obstetrics and Gynecology

## 2022-05-20 NOTE — Patient Outreach (Signed)
  Medicaid Managed Care   Unsuccessful Attempt Note   05/20/2022 Name: Brooke Aguirre MRN: [id number] DOB: 03-17-1996  Referred by: [place] Reason for referral : High Risk Managed Medicaid (Unsuccessful telephone outreach)  An unsuccessful telephone outreach was attempted today. The patient was referred to the case management team for assistance with care management and care coordination.    Follow Up Plan: The Managed Medicaid care management team will reach out to the patient again over the next 30 business  days.   Aida Raider RN, BSN [place]  [facility] - Managed Medicaid High Risk [phone]

## 2022-05-20 NOTE — Patient Instructions (Signed)
Hi Ms. Bihm, sorry to have missed you today- as a part of your Medicaid benefit, you are eligible for care management and care coordination services at no cost or copay. I was unable to reach you by phone today but would be happy to help you with your health related needs. Please feel free to call me at [phone]  A member of the Managed Medicaid care management team will reach out to you again over the next 30 business  days.   Aida Raider RN, BSN [place]  [facility] - Managed Medicaid High Risk [phone]

## 2022-05-30 ENCOUNTER — Telehealth: Payer: Medicaid Other | Admitting: Physician Assistant

## 2022-05-30 DIAGNOSIS — B9689 Other specified bacterial agents as the cause of diseases classified elsewhere: Secondary | ICD-10-CM | POA: Diagnosis not present

## 2022-05-30 DIAGNOSIS — J208 Acute bronchitis due to other specified organisms: Secondary | ICD-10-CM

## 2022-05-30 MED ORDER — DOXYCYCLINE HYCLATE 100 MG PO TABS: 100.0000 mg | ORAL_TABLET | Freq: Two times a day (BID) | ORAL | 0 refills | Status: DC

## 2022-05-30 MED ORDER — PREDNISONE 10 MG (21) PO TBPK: ORAL_TABLET | ORAL | 0 refills | Status: DC

## 2022-05-30 NOTE — Progress Notes (Signed)
We are sorry that you are not feeling well.  Here is how we plan to help!  Based on your presentation I believe you most likely have A cough due to bacteria.  When patients have a fever and a productive cough with a change in color or increased sputum production, we are concerned about bacterial bronchitis.  If left untreated it can progress to pneumonia.  If your symptoms do not improve with your treatment plan it is important that you contact your provider.   I have prescribed Doxycycline 100 mg twice a day for 7 days     In addition you may use A non-prescription cough medication called Mucinex DM: take 2 tablets every 12 hours.  Prednisone 10 mg daily for 6 days (see taper instructions below)  Directions for 6 day taper: Day 1: 2 tablets before breakfast, 1 after both lunch & dinner and 2 at bedtime Day 2: 1 tab before breakfast, 1 after both lunch & dinner and 2 at bedtime Day 3: 1 tab at each meal & 1 at bedtime Day 4: 1 tab at breakfast, 1 at lunch, 1 at bedtime Day 5: 1 tab at breakfast & 1 tab at bedtime Day 6: 1 tab at breakfast  From your responses in the eVisit questionnaire you describe inflammation in the upper respiratory tract which is causing a significant cough.  This is commonly called Bronchitis and has four common causes:   Allergies Viral Infections Acid Reflux Bacterial Infection Allergies, viruses and acid reflux are treated by controlling symptoms or eliminating the cause. An example might be a cough caused by taking certain blood pressure medications. You stop the cough by changing the medication. Another example might be a cough caused by acid reflux. Controlling the reflux helps control the cough.  USE OF BRONCHODILATOR ("RESCUE") INHALERS: There is a risk from using your bronchodilator too frequently.  The risk is that over-reliance on a medication which only relaxes the muscles surrounding the breathing tubes can reduce the effectiveness of medications prescribed  to reduce swelling and congestion of the tubes themselves.  Although you feel brief relief from the bronchodilator inhaler, your asthma may actually be worsening with the tubes becoming more swollen and filled with mucus.  This can delay other crucial treatments, such as oral steroid medications. If you need to use a bronchodilator inhaler daily, several times per day, you should discuss this with your provider.  There are probably better treatments that could be used to keep your asthma under control.     HOME CARE Only take medications as instructed by your medical team. Complete the entire course of an antibiotic. Drink plenty of fluids and get plenty of rest. Avoid close contacts especially the very young and the elderly Cover your mouth if you cough or cough into your sleeve. Always remember to wash your hands A steam or ultrasonic humidifier can help congestion.   GET HELP RIGHT AWAY IF: You develop worsening fever. You become short of breath You cough up blood. Your symptoms persist after you have completed your treatment plan MAKE SURE YOU  Understand these instructions. Will watch your condition. Will get help right away if you are not doing well or get worse.    Thank you for choosing an e-visit.  Your e-visit answers were reviewed by a board certified advanced clinical practitioner to complete your personal care plan. Depending upon the condition, your plan could have included both over the counter or prescription medications.  Please review your  pharmacy choice. Make sure the pharmacy is open so you can pick up prescription now. If there is a problem, you may contact your provider through [employer] and have the prescription routed to another pharmacy.  Your safety is important to [place]. If you have drug allergies check your prescription carefully.   For the next 24 hours you can use MyChart to ask questions about today's visit, request a non-urgent call back, or ask for a  work or school excuse. You will get an email in the next two days asking about your experience. I hope that your e-visit has been valuable and will speed your recovery.  I have spent 5 minutes in review of e-visit questionnaire, review and updating patient chart, medical decision making and response to patient.   Mar Daring, PA-C

## 2022-06-09 ENCOUNTER — Ambulatory Visit
Admission: EM | Admit: 2022-06-09 | Discharge: 2022-06-09 | Disposition: A | Discharge status: Home / Self Care | Payer: Medicaid Other | Attending: Emergency Medicine | Admitting: Emergency Medicine

## 2022-06-09 ENCOUNTER — Encounter: Payer: Self-pay | Admitting: Emergency Medicine

## 2022-06-09 ENCOUNTER — Other Ambulatory Visit: Payer: Self-pay

## 2022-06-09 DIAGNOSIS — J209 Acute bronchitis, unspecified: Secondary | ICD-10-CM | POA: Insufficient documentation

## 2022-06-09 DIAGNOSIS — Z1152 Encounter for screening for COVID-19: Secondary | ICD-10-CM | POA: Diagnosis not present

## 2022-06-09 DIAGNOSIS — Z3202 Encounter for pregnancy test, result negative: Secondary | ICD-10-CM | POA: Diagnosis not present

## 2022-06-09 DIAGNOSIS — R051 Acute cough: Secondary | ICD-10-CM | POA: Insufficient documentation

## 2022-06-09 LAB — POCT URINE PREGNANCY: Preg Test, Ur: NEGATIVE

## 2022-06-09 MED ORDER — AZITHROMYCIN 250 MG PO TABS: 250.0000 mg | ORAL_TABLET | Freq: Every day | ORAL | 0 refills | Status: DC

## 2022-06-09 MED ORDER — ALBUTEROL SULFATE HFA 108 (90 BASE) MCG/ACT IN AERS: 1.0000 | INHALATION_SPRAY | Freq: Four times a day (QID) | RESPIRATORY_TRACT | 0 refills | Status: DC | PRN

## 2022-06-09 MED ORDER — PREDNISONE 10 MG PO TABS: 40.0000 mg | ORAL_TABLET | Freq: Every day | ORAL | 0 refills | Status: AC

## 2022-06-09 NOTE — ED Provider Notes (Signed)
Roderic Palau    CSN: [id number] Arrival date & time: 06/09/22  1918      History   Chief Complaint Chief Complaint  Patient presents with   Cough    RSV test - Entered by patient    HPI RHILYN BATTLE is a 26 y.o. female.  Patient presents with nonproductive cough x 1 month which has gotten worse in the past week.  She denies fever, sore throat, shortness of breath, chest pain, or other symptoms.  Her infant is positive for RSV and patient request RSV testing.  Patient had an e-visit on 05/30/2022; diagnosed with bronchitis; treated with doxycycline and prednisone.  Patient had an e-visit on 04/25/2022; diagnosed with bronchitis; treated with Zithromax, prednisone, Tessalon Perles.  She had an e-visit on 04/19/2022; diagnosed with viral pharyngitis; treated symptomatically.  Current everyday smoker.   The history is provided by the patient and medical records.    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    Hypothyroidism    Kidney stone    Thyroid goiter     Patient Active Problem List   Diagnosis Date Noted   Non-reassuring electronic fetal monitoring tracing 10/10/2021   Decreased fetal movement 10/09/2021   Supervision of other high risk pregnancies, third trimester 82/50/0370   Single umbilical artery 48/88/9169   Obesity affecting pregnancy in second trimester 06/26/2021   Diet controlled gestational diabetes mellitus (GDM) in second trimester 06/26/2021   Gestational diabetes mellitus (GDM), antepartum 05/22/2021   Contact with or exposure to varicella 05/07/2021   Supervision of high-risk pregnancy 04/12/2021   Obesity affecting pregnancy 04/12/2021   Left nephrolithiasis 03/30/2019    Nephrolithiasis 03/29/2019   Status post LEEP (loop electrosurgical excision procedure) of cervix 05/03/2018   Dysplasia of cervix, 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   Tobacco user 02/25/2018   Heart murmur 05/03/2012   URI, acute 05/03/2012   Pyelonephritis 12/02/2011    Past Surgical History:  Procedure Laterality Date   EXTRACORPOREAL SHOCK WAVE LITHOTRIPSY Left 04/14/2019   Procedure: EXTRACORPOREAL SHOCK WAVE LITHOTRIPSY (ESWL);  Surgeon: Abbie Sons, MD;  Location: [facility] OR[facility];  Service: Urology;  Laterality: Left;   EXTRACORPOREAL SHOCK WAVE LITHOTRIPSY Right 05/05/2019   Procedure: EXTRACORPOREAL SHOCK WAVE LITHOTRIPSY (ESWL);  Surgeon: Abbie Sons, MD;  Location: [facility];  Service: Urology;  Laterality: Right;   LEEP N/A 05/03/2018   Procedure: LOOP ELECTROSURGICAL EXCISION PROCEDURE (LEEP);  Surgeon: Brayton Mars, MD;  Location: [facility];  Service: Gynecology;  Laterality: N/A;   REMOVAL OF DRUG DELIVERY IMPLANT Left 12/15/2014   Procedure: REMOVAL OF DRUG DELIVERY IMPLANT;  Surgeon: Gae Dry, MD;  Location: [facility] OR[facility];  Service: Gynecology;  Laterality: Left;    OB History     Gravida  3   Para  2   Term  2   Preterm  0   AB  0   Living  2      SAB  0   IAB  0   Ectopic  0   Multiple  0   Live Births  2  Home Medications    Prior to Admission medications   Medication Sig Start Date End Date Taking? Authorizing Provider  albuterol (VENTOLIN HFA) 108 (90 Base) MCG/ACT inhaler Inhale 1-2 puffs into the lungs every 6 (six) hours as needed. 06/09/22  Yes Sharion Balloon, NP  azithromycin (ZITHROMAX) 250 MG tablet Take 1 tablet (250 mg total) by mouth daily. Take first 2 tablets together, then 1 every day until finished. 06/09/22  Yes Sharion Balloon, NP  predniSONE (DELTASONE) 10 MG tablet Take 4 tablets (40 mg total) by mouth daily for 5 days. 06/09/22 06/14/22 Yes Sharion Balloon, NP  Accu-Chek Softclix Lancets lancets 1 each by  Other route 4 (four) times daily. Use as instructed Patient not taking: Reported on 12/30/2021 05/22/21   Malachy Mood, MD  Blood Glucose Monitoring Suppl (ACCU-CHEK GUIDE) w/Device KIT 1 kit by Does not apply route as directed. Patient not taking: Reported on 12/30/2021 05/22/21   Malachy Mood, MD  glucose blood (ACCU-CHEK GUIDE) test strip Use as instructed Patient not taking: Reported on 12/30/2021 05/22/21   Malachy Mood, MD  ondansetron (ZOFRAN ODT) 4 MG disintegrating tablet Take 1 tablet (4 mg total) by mouth every 8 (eight) hours as needed. Patient not taking: Reported on 10/22/2021 05/16/21   Menshew, Dannielle Karvonen, PA-C  polyethylene glycol powder (MIRALAX) 17 GM/SCOOP powder Take 9 g by mouth daily. Patient not taking: Reported on 10/22/2021 05/30/21   Naaman Plummer, MD  Prenatal Vit-Fe Fumarate-FA (MULTIVITAMIN-PRENATAL) 27-0.8 MG TABS tablet Take 1 tablet by mouth daily at 12 noon. Patient not taking: Reported on 03/13/2022    [provider]  promethazine (PHENERGAN) 25 MG tablet Take 1 tablet (25 mg total) by mouth every 8 (eight) hours as needed for nausea or vomiting. 10/18/20 11/13/20  Couture, Cortni S, PA-C    Family History Family History  Problem Relation Age of Onset   Diabetes Mother    Hepatitis C Mother    Hepatitis C Father    Diabetes Maternal Grandmother    Breast cancer Neg Hx    Ovarian cancer Neg Hx    Colon cancer Neg Hx     Social History Social History   Tobacco Use   Smoking status: Every Day    Packs/day: 0.50    Years: 10.00    Total pack years: 5.00    Types: Cigarettes   Smokeless tobacco: Never  Vaping Use   Vaping Use: Never used  Substance Use Topics   Alcohol use: Not Currently    Comment: occasional    Drug use: No     Allergies   Kiwi extract, Amoxicillin, Ciprofloxacin, Penicillins, Tramadol, Cranberry, Latex, and Sulfa antibiotics   Review of Systems Review of Systems  Constitutional:  Negative for chills  and fever.  HENT:  Negative for ear pain and sore throat.   Respiratory:  Positive for cough. Negative for shortness of breath.   Cardiovascular:  Negative for chest pain and palpitations.  Gastrointestinal:  Negative for diarrhea and vomiting.  Skin:  Negative for color change and rash.  All other systems reviewed and are negative.    Physical Exam Triage Vital Signs ED Triage Vitals  Enc Vitals Group     BP      Pulse      Resp      Temp      Temp src      SpO2      Weight      Height  Head Circumference      Peak Flow      Pain Score      Pain Loc      Pain Edu?      Excl. in [place]?    No data found.  Updated Vital Signs BP 131/84 (BP Location: Left Arm) Comment (BP Location): large cuff  Pulse 94   Temp 97.9 F (36.6 C)   Resp 18   LMP 04/21/2022   SpO2 96%   Visual Acuity Right Eye Distance:   Left Eye Distance:   Bilateral Distance:    Right Eye Near:   Left Eye Near:    Bilateral Near:     Physical Exam Vitals and nursing note reviewed.  Constitutional:      General: She is not in acute distress.    Appearance: She is well-developed. She is not ill-appearing.  HENT:     Right Ear: Tympanic membrane normal.     Left Ear: Tympanic membrane normal.     Nose: Nose normal.     Mouth/Throat:     Mouth: Mucous membranes are moist.     Pharynx: Oropharynx is clear.  Cardiovascular:     Rate and Rhythm: Normal rate and regular rhythm.     Heart sounds: Normal heart sounds.  Pulmonary:     Effort: Pulmonary effort is normal. No respiratory distress.     Breath sounds: Wheezing present.     Comments: Scattered expiratory wheezes.  Musculoskeletal:     Cervical back: Neck supple.  Skin:    General: Skin is warm and dry.  Neurological:     Mental Status: She is alert.  Psychiatric:        Mood and Affect: Mood normal.        Behavior: Behavior normal.      UC Treatments / Results  Labs (all labs ordered are listed, but only abnormal  results are displayed) Labs Reviewed  RESP PANEL BY RT-PCR (RSV, FLU A&B, COVID)  RVPGX2  POCT URINE PREGNANCY    EKG   Radiology No results found.  Procedures Procedures (including critical care time)  Medications Ordered in UC Medications - No data to display  Initial Impression / Assessment and Plan / UC Course  I have reviewed the triage vital signs and the nursing notes.  Pertinent labs & imaging results that were available during my care of the patient were reviewed by me and considered in my medical decision making (see chart for details).    Cough, acute bronchitis, negative pregnancy test.  Treating with albuterol inhaler, prednisone, Zithromax.  COVID, Flu, RSV pending.  Discussed symptomatic treatment including Tylenol or ibuprofen, rest, hydration.  Instructed patient to follow up with her PCP for a recheck in 2-3 days.  She agrees to plan of care.   Final Clinical Impressions(s) / UC Diagnoses   Final diagnoses:  Acute cough  Acute bronchitis, unspecified organism  Negative pregnancy test     Discharge Instructions      Use the albuterol inhaler as directed.  Take the prednisone and Zithromax as directed.  Follow up with your primary care provider in the next 2-3 days.        ED Prescriptions     Medication Sig Dispense Auth. Provider   albuterol (VENTOLIN HFA) 108 (90 Base) MCG/ACT inhaler Inhale 1-2 puffs into the lungs every 6 (six) hours as needed. 18 g Sharion Balloon, NP   predniSONE (DELTASONE) 10 MG tablet Take 4 tablets (40 mg  total) by mouth daily for 5 days. 20 tablet Sharion Balloon, NP   azithromycin (ZITHROMAX) 250 MG tablet Take 1 tablet (250 mg total) by mouth daily. Take first 2 tablets together, then 1 every day until finished. 6 tablet Sharion Balloon, NP      PDMP not reviewed this encounter.   Sharion Balloon, NP 06/09/22 2007

## 2022-06-09 NOTE — Discharge Instructions (Addendum)
Use the albuterol inhaler as directed.  Take the prednisone and Zithromax as directed.  Follow up with your primary care provider in the next 2-3 days.

## 2022-06-09 NOTE — ED Triage Notes (Signed)
Cough has worsened over this past week.  Complains of a cough for over a month.  Patient has a patient in the ICU after surgery, but tested positive for RSV  Has had doxy and prednisone after a virtual visit.

## 2022-06-10 LAB — RESP PANEL BY RT-PCR (RSV, FLU A&B, COVID)  RVPGX2
Influenza A by PCR: NEGATIVE
Influenza B by PCR: NEGATIVE
Resp Syncytial Virus by PCR: NEGATIVE
SARS Coronavirus 2 by RT PCR: NEGATIVE

## 2022-06-13 DIAGNOSIS — Z419 Encounter for procedure for purposes other than remedying health state, unspecified: Secondary | ICD-10-CM | POA: Diagnosis not present

## 2022-06-20 ENCOUNTER — Other Ambulatory Visit: Payer: Medicaid Other | Admitting: Obstetrics and Gynecology

## 2022-06-20 NOTE — Patient Instructions (Signed)
Hi Ms. Kueker, I am very sorry I have missed you today- as a part of your Medicaid benefit, you are eligible for care management and care coordination services at no cost or copay. I was unable to reach you by phone today but would be happy to help you with your health related needs. Please feel free to call me at [phone].  A member of the Managed Medicaid care management team will reach out to you again over the next 30 business  days.   Kathi Der RN, BSN [place]  [employer] - Managed Medicaid High Risk [phone]

## 2022-06-20 NOTE — Patient Outreach (Signed)
  Medicaid Managed Care   Unsuccessful Attempt Note   06/20/2022 Name: Brooke Aguirre MRN: [id number] DOB: Nov 21, 1995  Referred by: [facility] Reason for referral : High Risk Managed Medicaid (Unsuccessful telephone outreach)   A second unsuccessful telephone outreach was attempted today. The patient was referred to the case management team for assistance with care management and care coordination.    Follow Up Plan: The Managed Medicaid care management team will reach out to the patient again over the next 30 business  days. and The  Patient has been provided with contact information for the Managed Medicaid care management team and has been advised to call with any health related questions or concerns.    Kathi Der RN, BSN   [employer] - Managed Medicaid High Risk [phone].

## 2022-07-04 ENCOUNTER — Telehealth: Payer: Medicaid Other | Admitting: Physician Assistant

## 2022-07-04 DIAGNOSIS — J208 Acute bronchitis due to other specified organisms: Secondary | ICD-10-CM | POA: Diagnosis not present

## 2022-07-04 MED ORDER — PREDNISONE 10 MG (21) PO TBPK: ORAL_TABLET | ORAL | 0 refills | Status: DC

## 2022-07-04 NOTE — Progress Notes (Signed)
We are sorry that you are not feeling well.  Here is how we plan to help!  Based on your presentation I believe you most likely have A cough due to a virus.  This is called viral bronchitis and is best treated by rest, plenty of fluids and control of the cough.  You may use Ibuprofen or Tylenol as directed to help your symptoms.     In addition you may use A non-prescription cough medication called Robitussin DAC. Take 2 teaspoons every 8 hours or Delsym: take 2 teaspoons every 12 hours. and A non-prescription cough medication called Mucinex DM: take 2 tablets every 12 hours.  Prednisone 10 mg daily for 6 days (see taper instructions below)  Directions for 6 day taper: Day 1: 2 tablets before breakfast, 1 after both lunch & dinner and 2 at bedtime Day 2: 1 tab before breakfast, 1 after both lunch & dinner and 2 at bedtime Day 3: 1 tab at each meal & 1 at bedtime Day 4: 1 tab at breakfast, 1 at lunch, 1 at bedtime Day 5: 1 tab at breakfast & 1 tab at bedtime Day 6: 1 tab at breakfast  From your responses in the eVisit questionnaire you describe inflammation in the upper respiratory tract which is causing a significant cough.  This is commonly called Bronchitis and has four common causes:   Allergies Viral Infections Acid Reflux Bacterial Infection Allergies, viruses and acid reflux are treated by controlling symptoms or eliminating the cause. An example might be a cough caused by taking certain blood pressure medications. You stop the cough by changing the medication. Another example might be a cough caused by acid reflux. Controlling the reflux helps control the cough.  USE OF BRONCHODILATOR ("RESCUE") INHALERS: There is a risk from using your bronchodilator too frequently.  The risk is that over-reliance on a medication which only relaxes the muscles surrounding the breathing tubes can reduce the effectiveness of medications prescribed to reduce swelling and congestion of the tubes  themselves.  Although you feel brief relief from the bronchodilator inhaler, your asthma may actually be worsening with the tubes becoming more swollen and filled with mucus.  This can delay other crucial treatments, such as oral steroid medications. If you need to use a bronchodilator inhaler daily, several times per day, you should discuss this with your provider.  There are probably better treatments that could be used to keep your asthma under control.     HOME CARE Only take medications as instructed by your medical team. Complete the entire course of an antibiotic. Drink plenty of fluids and get plenty of rest. Avoid close contacts especially the very young and the elderly Cover your mouth if you cough or cough into your sleeve. Always remember to wash your hands A steam or ultrasonic humidifier can help congestion.   GET HELP RIGHT AWAY IF: You develop worsening fever. You become short of breath You cough up blood. Your symptoms persist after you have completed your treatment plan MAKE SURE YOU  Understand these instructions. Will watch your condition. Will get help right away if you are not doing well or get worse.    Thank you for choosing an e-visit.  Your e-visit answers were reviewed by a board certified advanced clinical practitioner to complete your personal care plan. Depending upon the condition, your plan could have included both over the counter or prescription medications.  Please review your pharmacy choice. Make sure the pharmacy is open so you can  pick up prescription now. If there is a problem, you may contact your provider through [employer] and have the prescription routed to another pharmacy.  Your safety is important to [place]. If you have drug allergies check your prescription carefully.   For the next 24 hours you can use MyChart to ask questions about today's visit, request a non-urgent call back, or ask for a work or school excuse. You will get an email  in the next two days asking about your experience. I hope that your e-visit has been valuable and will speed your recovery.  I have spent 5 minutes in review of e-visit questionnaire, review and updating patient chart, medical decision making and response to patient.   Margaretann Loveless, PA-C

## 2022-07-14 DIAGNOSIS — Z419 Encounter for procedure for purposes other than remedying health state, unspecified: Secondary | ICD-10-CM | POA: Diagnosis not present

## 2022-07-17 DIAGNOSIS — Z85820 Personal history of malignant melanoma of skin: Secondary | ICD-10-CM | POA: Diagnosis not present

## 2022-07-17 DIAGNOSIS — E118 Type 2 diabetes mellitus with unspecified complications: Secondary | ICD-10-CM | POA: Diagnosis not present

## 2022-07-17 DIAGNOSIS — Z6841 Body Mass Index (BMI) 40.0 and over, adult: Secondary | ICD-10-CM | POA: Diagnosis not present

## 2022-07-27 ENCOUNTER — Telehealth: Payer: Medicaid Other | Admitting: Physician Assistant

## 2022-07-27 DIAGNOSIS — A084 Viral intestinal infection, unspecified: Secondary | ICD-10-CM | POA: Diagnosis not present

## 2022-07-27 MED ORDER — ONDANSETRON 4 MG PO TBDP: 4.0000 mg | ORAL_TABLET | Freq: Three times a day (TID) | ORAL | 0 refills | Status: DC | PRN

## 2022-07-27 NOTE — Progress Notes (Signed)
E-Visit for Nausea and Vomiting   We are sorry that you are not feeling well. Here is how we plan to help!  Based on what you have shared with me it looks like you have a Virus that is irritating your GI tract.  Vomiting is the forceful emptying of a portion of the stomach's content through the mouth.  Although nausea and vomiting can make you feel miserable, it's important to remember that these are not diseases, but rather symptoms of an underlying illness.  When we treat short term symptoms, we always caution that any symptoms that persist should be fully evaluated in a medical office.  I have prescribed a medication that will help alleviate your symptoms and allow you to stay hydrated:  Zofran 4 mg 1 tablet every 8 hours as needed for nausea and vomiting   Imodium AD 2mg  can be picked up OTC for diarrhea.  HOME CARE: Drink clear liquids.  This is very important! Dehydration (the lack of fluid) can lead to a serious complication.  Start off with 1 tablespoon every 5 minutes for 8 hours. You may begin eating bland foods after 8 hours without vomiting.  Start with saltine crackers, white bread, rice, mashed potatoes, applesauce. After 48 hours on a bland diet, you may resume a normal diet. Try to go to sleep.  Sleep often empties the stomach and relieves the need to vomit.  GET HELP RIGHT AWAY IF:  Your symptoms do not improve or worsen within 2 days after treatment. You have a fever for over 3 days. You cannot keep down fluids after trying the medication.  MAKE SURE YOU:  Understand these instructions. Will watch your condition. Will get help right away if you are not doing well or get worse.    Thank you for choosing an e-visit.  Your e-visit answers were reviewed by a board certified advanced clinical practitioner to complete your personal care plan. Depending upon the condition, your plan could have included both over the counter or prescription medications.  Please review  your pharmacy choice. Make sure the pharmacy is open so you can pick up prescription now. If there is a problem, you may contact your provider through [employer] and have the prescription routed to another pharmacy.  Your safety is important to [place]. If you have drug allergies check your prescription carefully.   For the next 24 hours you can use MyChart to ask questions about today's visit, request a non-urgent call back, or ask for a work or school excuse. You will get an email in the next two days asking about your experience. I hope that your e-visit has been valuable and will speed your recovery.  I have spent 5 minutes in review of e-visit questionnaire, review and updating patient chart, medical decision making and response to patient.   Mar Daring, PA-C

## 2022-07-31 ENCOUNTER — Other Ambulatory Visit: Payer: Medicaid Other | Admitting: Obstetrics and Gynecology

## 2022-07-31 ENCOUNTER — Encounter: Payer: Self-pay | Admitting: Obstetrics and Gynecology

## 2022-07-31 ENCOUNTER — Ambulatory Visit: Payer: Medicaid Other | Admitting: Obstetrics and Gynecology

## 2022-07-31 NOTE — Patient Outreach (Signed)
Care Coordination  07/31/2022  Brooke Aguirre 27-Jan-1996 [id number]  RNCM called patient at scheduled time-patient answered and asked that I call back in about an hour as she was at an appointment.   RNCM called back as requested-no answer, left message.  Aida Raider RN, BSN [place]  [employer] - Managed Medicaid High Risk [phone]

## 2022-08-11 DIAGNOSIS — F411 Generalized anxiety disorder: Secondary | ICD-10-CM | POA: Diagnosis not present

## 2022-08-11 DIAGNOSIS — F4323 Adjustment disorder with mixed anxiety and depressed mood: Secondary | ICD-10-CM | POA: Diagnosis not present

## 2022-08-14 DIAGNOSIS — Z419 Encounter for procedure for purposes other than remedying health state, unspecified: Secondary | ICD-10-CM | POA: Diagnosis not present

## 2022-08-20 DIAGNOSIS — Z85820 Personal history of malignant melanoma of skin: Secondary | ICD-10-CM | POA: Diagnosis not present

## 2022-08-20 DIAGNOSIS — D229 Melanocytic nevi, unspecified: Secondary | ICD-10-CM | POA: Diagnosis not present

## 2022-08-25 DIAGNOSIS — F411 Generalized anxiety disorder: Secondary | ICD-10-CM | POA: Diagnosis not present

## 2022-08-25 DIAGNOSIS — F4323 Adjustment disorder with mixed anxiety and depressed mood: Secondary | ICD-10-CM | POA: Diagnosis not present

## 2022-08-27 ENCOUNTER — Other Ambulatory Visit: Payer: Medicaid Other | Admitting: Obstetrics and Gynecology

## 2022-08-27 NOTE — Patient Instructions (Signed)
  Hi Ms. Shreeve, I hope you are doing okay - as a part of your Medicaid benefit, you are eligible for care management and care coordination services at no cost or copay. I was unable to reach you by phone today but would be happy to help you with your health related needs. Please feel free to call me at [phone]  A member of the Managed Medicaid care management team will reach out to you again over the next 30 business days.   Aida Raider RN, BSN [place]  [employer] - Managed Medicaid High Risk [phone]

## 2022-08-27 NOTE — Patient Outreach (Signed)
  Medicaid Managed Care   Unsuccessful Attempt Note   08/27/2022 Name: Brooke Aguirre MRN: [id number] DOB: 1996/02/07  Referred by: [place] Reason for referral : High Risk Managed Medicaid (Unsuccessful telephone outreach)   An unsuccessful telephone outreach was attempted today. The patient was referred to the case management team for assistance with care management and care coordination.    Follow Up Plan: The Managed Medicaid care management team will reach out to the patient again over the next 30 business  days. and The  Patient has been provided with contact information for the Managed Medicaid care management team and has been advised to call with any health related questions or concerns.    Aida Raider RN, BSN [place]  [employer] - Managed Medicaid High Risk [phone]

## 2022-08-29 ENCOUNTER — Telehealth: Payer: Medicaid Other | Admitting: Physician Assistant

## 2022-08-29 DIAGNOSIS — W57XXXA Bitten or stung by nonvenomous insect and other nonvenomous arthropods, initial encounter: Secondary | ICD-10-CM | POA: Diagnosis not present

## 2022-08-29 DIAGNOSIS — R21 Rash and other nonspecific skin eruption: Secondary | ICD-10-CM

## 2022-08-29 NOTE — Progress Notes (Signed)
I have spent 5 minutes in review of e-visit questionnaire, review and updating patient chart, medical decision making and response to patient.   Shamonique Battiste Cody Austine Kelsay, PA-C    

## 2022-08-29 NOTE — Progress Notes (Signed)
E Visit for Rash  We are sorry that you are not feeling well. Here is how we plan to help!  The area of questions does seem like a local histamine response from an insect/mite bite itself. For now, I would just keep the area clean and dry. Avoid picking at the area. Apply topical cortisone cream OTC or Benadryl cream OTC. Monitor overnight. Let us know if not resolving on its own or any new/worsening symptoms.    HOME CARE:  Use hydrocortisone cream. Take an antihistamine like Benadryl for widespread rashes that itch.  The adult dose of Benadryl is 25-50 mg by mouth 4 times daily. Caution:  This type of medication may cause sleepiness.  Do not drink alcohol, drive, or operate dangerous machinery while taking antihistamines.  Do not take these medications if you have prostate enlargement.  Read package instructions thoroughly on all medications that you take.  GET HELP RIGHT AWAY IF:  Symptoms don't go away after treatment. Severe itching that persists. If you rash spreads or swells. If you rash begins to smell. If it blisters and opens or develops a yellow-brown crust. You develop a fever. You have a sore throat. You become short of breath.  MAKE SURE YOU:  Understand these instructions. Will watch your condition. Will get help right away if you are not doing well or get worse.  Thank you for choosing an e-visit.  Your e-visit answers were reviewed by a board certified advanced clinical practitioner to complete your personal care plan. Depending upon the condition, your plan could have included both over the counter or prescription medications.  Please review your pharmacy choice. Make sure the pharmacy is open so you can pick up prescription now. If there is a problem, you may contact your provider through [employer] and have the prescription routed to another pharmacy.  Your safety is important to [place]. If you have drug allergies check your prescription carefully.   For the  next 24 hours you can use MyChart to ask questions about today's visit, request a non-urgent call back, or ask for a work or school excuse. You will get an email in the next two days asking about your experience. I hope that your e-visit has been valuable and will speed your recovery.

## 2022-09-12 ENCOUNTER — Telehealth: Payer: Medicaid Other | Admitting: Nurse Practitioner

## 2022-09-12 DIAGNOSIS — Z419 Encounter for procedure for purposes other than remedying health state, unspecified: Secondary | ICD-10-CM | POA: Diagnosis not present

## 2022-09-12 DIAGNOSIS — J020 Streptococcal pharyngitis: Secondary | ICD-10-CM | POA: Diagnosis not present

## 2022-09-12 MED ORDER — AZITHROMYCIN 250 MG PO TABS: ORAL_TABLET | ORAL | 0 refills | Status: AC

## 2022-09-12 NOTE — Progress Notes (Signed)
E-Visit for Sore Throat - Strep Symptoms  We are sorry that you are not feeling well.  Here is how we plan to help!  Based on what you have shared with me it is likely that you have strep pharyngitis.  Strep pharyngitis is inflammation and infection in the back of the throat.  This is an infection cause by bacteria and is treated with antibiotics.  I have prescribed:  Meds ordered this encounter  Medications   azithromycin (ZITHROMAX) 250 MG tablet    Sig: Take 2 tablets on day 1, then 1 tablet daily on days 2 through 5    Dispense:  6 tablet    Refill:  0    . For throat pain, we recommend over the counter oral pain relief medications such as acetaminophen or aspirin, or anti-inflammatory medications such as ibuprofen or naproxen sodium. Topical treatments such as oral throat lozenges or sprays may be used as needed. Strep infections are not as easily transmitted as other respiratory infections, however we still recommend that you avoid close contact with loved ones, especially the very young and elderly.  Remember to wash your hands thoroughly throughout the day as this is the number one way to prevent the spread of infection and wipe down door knobs and counters with disinfectant.   Home Care: Only take medications as instructed by your medical team. Complete the entire course of an antibiotic. Do not take these medications with alcohol. A steam or ultrasonic humidifier can help congestion.  You can place a towel over your head and breathe in the steam from hot water coming from a faucet. Avoid close contacts especially the very young and the elderly. Cover your mouth when you cough or sneeze. Always remember to wash your hands.  Get Help Right Away If: You develop worsening fever or sinus pain. You develop a severe head ache or visual changes. Your symptoms persist after you have completed your treatment plan.  Make sure you Understand these instructions. Will watch your  condition. Will get help right away if you are not doing well or get worse.   Thank you for choosing an e-visit.  Your e-visit answers were reviewed by a board certified advanced clinical practitioner to complete your personal care plan. Depending upon the condition, your plan could have included both over the counter or prescription medications.  Please review your pharmacy choice. Make sure the pharmacy is open so you can pick up prescription now. If there is a problem, you may contact your provider through [employer] and have the prescription routed to another pharmacy.  Your safety is important to [place]. If you have drug allergies check your prescription carefully.   For the next 24 hours you can use MyChart to ask questions about today's visit, request a non-urgent call back, or ask for a work or school excuse. You will get an email in the next two days asking about your experience. I hope that your e-visit has been valuable and will speed your recovery.  I spent approximately 5 minutes reviewing the patient's history, current symptoms and coordinating their care today.

## 2022-09-23 ENCOUNTER — Telehealth: Payer: Medicaid Other | Admitting: Physician Assistant

## 2022-09-23 DIAGNOSIS — N3000 Acute cystitis without hematuria: Secondary | ICD-10-CM | POA: Diagnosis not present

## 2022-09-23 MED ORDER — FLUCONAZOLE 150 MG PO TABS: 150.0000 mg | ORAL_TABLET | Freq: Every day | ORAL | 0 refills | Status: DC

## 2022-09-23 NOTE — Progress Notes (Signed)
E-Visit for Vaginal Symptoms  We are sorry that you are not feeling well. Here is how we plan to help! Based on what you shared with me it looks like you: May have a yeast vaginosis  Vaginosis is an inflammation of the vagina that can result in discharge, itching and pain. The cause is usually a change in the normal balance of vaginal bacteria or an infection. Vaginosis can also result from reduced estrogen levels after menopause.  The most common causes of vaginosis are:   Bacterial vaginosis which results from an overgrowth of one on several organisms that are normally present in your vagina.   Yeast infections which are caused by a naturally occurring fungus called candida.   Vaginal atrophy (atrophic vaginosis) which results from the thinning of the vagina from reduced estrogen levels after menopause.   Trichomoniasis which is caused by a parasite and is commonly transmitted by sexual intercourse.  Factors that increase your risk of developing vaginosis include: Medications, such as antibiotics and steroids Uncontrolled diabetes Use of hygiene products such as bubble bath, vaginal spray or vaginal deodorant Douching Wearing damp or tight-fitting clothing Using an intrauterine device (IUD) for birth control Hormonal changes, such as those associated with pregnancy, birth control pills or menopause Sexual activity Having a sexually transmitted infection  Your treatment plan is A single Diflucan (fluconazole) '150mg'$  tablet once.  I have electronically sent this prescription into the pharmacy that you have chosen.  Be sure to take all of the medication as directed. Stop taking any medication if you develop a rash, tongue swelling or shortness of breath. Mothers who are breast feeding should consider pumping and discarding their breast milk while on these antibiotics. However, there is no consensus that infant exposure at these doses would be harmful.  Remember that medication creams can  weaken latex condoms. . If symptoms persist please follow up with an in person evaluation and urinalysis.   HOME CARE:  Good hygiene may prevent some types of vaginosis from recurring and may relieve some symptoms:  Avoid baths, hot tubs and whirlpool spas. Rinse soap from your outer genital area after a shower, and dry the area well to prevent irritation. Don't use scented or harsh soaps, such as those with deodorant or antibacterial action. Avoid irritants. These include scented tampons and pads. Wipe from front to back after using the toilet. Doing so avoids spreading fecal bacteria to your vagina.  Other things that may help prevent vaginosis include:  Don't douche. Your vagina doesn't require cleansing other than normal bathing. Repetitive douching disrupts the normal organisms that reside in the vagina and can actually increase your risk of vaginal infection. Douching won't clear up a vaginal infection. Use a latex condom. Both female and female latex condoms may help you avoid infections spread by sexual contact. Wear cotton underwear. Also wear pantyhose with a cotton crotch. If you feel comfortable without it, skip wearing underwear to bed. Yeast thrives in [employer] Your symptoms should improve in the next day or two.  GET HELP RIGHT AWAY IF:  You have pain in your lower abdomen ( pelvic area or over your ovaries) You develop nausea or vomiting You develop a fever Your discharge changes or worsens You have persistent pain with intercourse You develop shortness of breath, a rapid pulse, or you faint.  These symptoms could be signs of problems or infections that need to be evaluated by a medical provider now.  MAKE SURE YOU   Understand these instructions.  Will watch your condition. Will get help right away if you are not doing well or get worse.  Thank you for choosing an e-visit.  Your e-visit answers were reviewed by a board certified advanced clinical  practitioner to complete your personal care plan. Depending upon the condition, your plan could have included both over the counter or prescription medications.  Please review your pharmacy choice. Make sure the pharmacy is open so you can pick up prescription now. If there is a problem, you may contact your provider through [employer] and have the prescription routed to another pharmacy.  Your safety is important to [place]. If you have drug allergies check your prescription carefully.   For the next 24 hours you can use MyChart to ask questions about today's visit, request a non-urgent call back, or ask for a work or school excuse. You will get an email in the next two days asking about your experience. I hope that your e-visit has been valuable and will speed your recovery.   I have spent 5 minutes in review of e-visit questionnaire, review and updating patient chart, medical decision making and response to patient.   Lenise Arena Ward, PA-C

## 2022-09-25 ENCOUNTER — Other Ambulatory Visit: Payer: Medicaid Other | Admitting: Obstetrics and Gynecology

## 2022-09-25 NOTE — Patient Outreach (Signed)
  Medicaid Managed Care   Unsuccessful Attempt Note   09/25/2022 Name: NAUREEN BENTON MRN: [id number] DOB: 09/12/1995  Referred by: [place] Reason for referral : High Risk Managed Medicaid (Unsuccessful telephone outreach)  A second unsuccessful telephone outreach was attempted today. The patient was referred to the case management team for assistance with care management and care coordination.    Follow Up Plan: The Managed Medicaid care management team will reach out to the patient again over the next 30 business  days. and The  Patient has been provided with contact information for the Managed Medicaid care management team and has been advised to call with any health related questions or concerns.   Aida Raider RN, BSN [place]  [facility]ator - Managed Medicaid High Risk [phone]

## 2022-09-25 NOTE — Patient Instructions (Signed)
Hi Ms. Schwiebert, I hope you are doing well- as a part of your Medicaid benefit, you are eligible for care management and care coordination services at no cost or copay. I was unable to reach you by phone today but would be happy to help you with your health related needs. Please feel free to call me at [phone].  A member of the Managed Medicaid care management team will reach out to you again over the next 30 business  days.   Aida Raider RN, BSN [place]  [facility] - Managed Medicaid High Risk [phone]

## 2022-09-29 DIAGNOSIS — F411 Generalized anxiety disorder: Secondary | ICD-10-CM | POA: Diagnosis not present

## 2022-09-29 DIAGNOSIS — F4323 Adjustment disorder with mixed anxiety and depressed mood: Secondary | ICD-10-CM | POA: Diagnosis not present

## 2022-09-30 ENCOUNTER — Telehealth: Payer: Self-pay

## 2022-09-30 NOTE — Telephone Encounter (Signed)
..   Medicaid Managed Care   Unsuccessful Outreach Note  09/30/2022 Name: Brooke Aguirre MRN: [id number] DOB: 15-Jan-1996  Referred by: [place] Reason for referral : Appointment (I called the patient today to reschedule her missed phone appointments with the MM RNCM. I left my name and number on her VM.)   Third unsuccessful telephone outreach was attempted today. The patient was referred to the case management team for assistance with care management and care coordination. The patient's primary care provider has been notified of our unsuccessful attempts to make or maintain contact with the patient. The care management team is pleased to engage with this patient at any time in the future should he/she be interested in assistance from the care management team.   Follow Up Plan: We have been unable to make contact with the patient for follow up. The care management team is available to follow up with the patient after provider conversation with the patient regarding recommendation for care management engagement and subsequent re-referral to the care management team.  A HIPAA compliant phone message was left for the patient providing contact information and requesting a return call.   [place]

## 2022-10-07 ENCOUNTER — Other Ambulatory Visit: Payer: Medicaid Other | Admitting: Obstetrics and Gynecology

## 2022-10-07 NOTE — Patient Outreach (Cosign Needed)
Care Coordination  10/07/2022  Brooke Aguirre 1995/08/15 [id number]   Medicaid Managed Care   Unsuccessful Outreach Note  10/07/2022 Name: Brooke Aguirre MRN: [id number] DOB: 1996/04/22  Referred by: [place] Reason for referral : High Risk Managed Medicaid (Unsuccessful telephone outreach)  Third unsuccessful telephone outreach was attempted. . The patient was referred to the case management team for assistance with care management and care coordination. The patient's primary care provider has been notified of our unsuccessful attempts to make or maintain contact with the patient. The care management team is pleased to engage with this patient at any time in the future should he/she be interested in assistance from the care management team.   Follow Up Plan: The patient has been provided with contact information for the care management team and has been advised to call with any health related questions or concerns.  We have been unable to make contact with the patient for follow up. The care management team is available to follow up with the patient after provider conversation with the patient regarding recommendation for care management engagement and subsequent re-referral to the care management team.   Aida Raider RN, BSN [place] Management Coordinator - Managed [place] High Risk [phone]

## 2022-10-07 NOTE — Patient Instructions (Signed)
Visit Information  [name]  - as a part of your Medicaid benefit, you are eligible for care management and care coordination services at no cost or copay. We have been  unable to reach you by phone  but would be happy to help you with your health related needs. Please feel free to call me at [phone].  A member of the Managed Medicaid care management team will reach out to you again over the next 30 business  days.   Aida Raider RN, BSN [place]  [facility] - Managed Medicaid High Risk [phone].

## 2022-10-08 ENCOUNTER — Encounter: Payer: Self-pay | Admitting: Emergency Medicine

## 2022-10-08 ENCOUNTER — Emergency Department
Admission: EM | Admit: 2022-10-08 | Discharge: 2022-10-08 | Disposition: A | Discharge status: Home / Self Care | Payer: Medicaid Other | Attending: Emergency Medicine | Admitting: Emergency Medicine

## 2022-10-08 DIAGNOSIS — E039 Hypothyroidism, unspecified: Secondary | ICD-10-CM | POA: Diagnosis not present

## 2022-10-08 DIAGNOSIS — N764 Abscess of vulva: Secondary | ICD-10-CM | POA: Diagnosis not present

## 2022-10-08 DIAGNOSIS — N189 Chronic kidney disease, unspecified: Secondary | ICD-10-CM | POA: Insufficient documentation

## 2022-10-08 DIAGNOSIS — N762 Acute vulvitis: Secondary | ICD-10-CM | POA: Insufficient documentation

## 2022-10-08 MED ORDER — LIDOCAINE HCL (PF) 1 % IJ SOLN
5.0000 mL | Freq: Once | INTRAMUSCULAR | Status: AC
  Administered 2022-10-08: 5 mL via INTRADERMAL
  Filled 2022-10-08: qty 5

## 2022-10-08 MED ORDER — DOXYCYCLINE HYCLATE 100 MG PO TABS
100.0000 mg | ORAL_TABLET | Freq: Once | ORAL | Status: AC
  Administered 2022-10-08: 100 mg via ORAL
  Filled 2022-10-08: qty 1

## 2022-10-08 MED ORDER — ONDANSETRON 4 MG PO TBDP: 4.0000 mg | ORAL_TABLET | Freq: Four times a day (QID) | ORAL | 0 refills | Status: DC | PRN

## 2022-10-08 MED ORDER — DOXYCYCLINE MONOHYDRATE 100 MG PO TABS: 100.0000 mg | ORAL_TABLET | Freq: Two times a day (BID) | ORAL | 0 refills | Status: DC

## 2022-10-08 NOTE — Discharge Instructions (Signed)
You may alternate Tylenol 1000 mg every 6 hours as needed for pain, fever and Ibuprofen 800 mg every 6-8 hours as needed for pain, fever.  Please take Ibuprofen with food.  Do not take more than 4000 mg of Tylenol (acetaminophen) in a 24 hour period.  

## 2022-10-08 NOTE — ED Provider Notes (Signed)
[facility] Provider Note    Event Date/Time   First MD Initiated Contact with Patient 10/08/22 [phone]     (approximate)   History   Abscess   HPI  Brooke Aguirre is a 27 y.o. female with history of hypothyroidism who presents to the emergency department with an abscess to the left labia that has been present for about a week.  She tried to lance it herself at home and only got a small amount of blood and no pus.  States it has gotten bigger since that time.  No fevers.   History provided by patient.    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    Hypothyroidism    Kidney stone    Thyroid goiter     Past Surgical History:  Procedure Laterality Date   EXTRACORPOREAL SHOCK WAVE LITHOTRIPSY Left 04/14/2019   Procedure: EXTRACORPOREAL SHOCK WAVE LITHOTRIPSY (ESWL);  Surgeon: Abbie Sons, MD;  Location: [facility] ORS;  Service: Urology;  Laterality: Left;   EXTRACORPOREAL SHOCK WAVE LITHOTRIPSY Right 05/05/2019   Procedure: EXTRACORPOREAL SHOCK WAVE LITHOTRIPSY (ESWL);  Surgeon: Abbie Sons, MD;  Location: [facility] ORS;  Service: Urology;  Laterality: Right;   LEEP N/A 05/03/2018   Procedure: LOOP ELECTROSURGICAL EXCISION PROCEDURE (LEEP);  Surgeon: Brayton Mars, MD;  Location: [facility] ORS;  Service: Gynecology;  Laterality: N/A;   REMOVAL OF DRUG DELIVERY IMPLANT Left 12/15/2014   Procedure: REMOVAL OF DRUG DELIVERY IMPLANT;  Surgeon: Gae Dry, MD;  Location: [facility] ORS;  Service: Gynecology;  Laterality: Left;    MEDICATIONS:  Prior to Admission medications   Medication Sig Start Date End Date Taking? Authorizing Provider  Accu-Chek Softclix Lancets lancets 1 each by Other  route 4 (four) times daily. Use as instructed Patient not taking: Reported on 12/30/2021 05/22/21   Malachy Mood, MD  albuterol (VENTOLIN HFA) 108 (90 Base) MCG/ACT inhaler Inhale 1-2 puffs into the lungs every 6 (six) hours as needed. 06/09/22   Sharion Balloon, NP  Blood Glucose Monitoring Suppl (ACCU-CHEK GUIDE) w/Device KIT 1 kit by Does not apply route as directed. Patient not taking: Reported on 12/30/2021 05/22/21   Malachy Mood, MD  fluconazole (DIFLUCAN) 150 MG tablet Take 1 tablet (150 mg total) by mouth daily. 09/23/22   Codi Kertz, Lenise Arena, PA-C  glucose blood (ACCU-CHEK GUIDE) test strip Use as instructed Patient not taking: Reported on 12/30/2021 05/22/21   Malachy Mood, MD  ondansetron (ZOFRAN-ODT) 4 MG disintegrating tablet Take 1 tablet (4 mg total) by mouth every 8 (eight) hours as needed. 07/27/22   Mar Daring, PA-C  polyethylene glycol powder (MIRALAX) 17 GM/SCOOP powder Take 9 g by mouth daily. Patient not taking: Reported on 10/22/2021 05/30/21   Naaman Plummer, MD  Prenatal Vit-Fe Fumarate-FA (MULTIVITAMIN-PRENATAL) 27-0.8 MG TABS tablet Take 1 tablet by mouth daily at 12 noon. Patient not taking: Reported on 03/13/2022    [provider]  promethazine (PHENERGAN) 25 MG tablet Take 1 tablet (25 mg total) by mouth every 8 (eight) hours as needed for nausea or vomiting. 10/18/20 11/13/20  Rodney Booze, PA-C    Physical  Exam   Triage Vital Signs: ED Triage Vitals [10/08/22 0259]  Enc Vitals Group     BP 120/80     Pulse Rate 94     Resp 18     Temp 97.7 F (36.5 C)     Temp Source Oral     SpO2 98 %     Weight 210 lb (95.3 kg)     Height 4\' 11"  (1.499 m)     Head Circumference      Peak Flow      Pain Score 7     Pain Loc      Pain Edu?      Excl. in [place]?     Most recent vital signs: Vitals:   10/08/22 0259  BP: 120/80  Pulse: 94  Resp: 18  Temp: 97.7 F (36.5 C)  SpO2: 98%    CONSTITUTIONAL: Alert, responds appropriately to  questions. Well-appearing; well-nourished HEAD: Normocephalic, atraumatic EYES: Conjunctivae clear, pupils appear equal, sclera nonicteric ENT: normal nose; moist mucous membranes NECK: Supple, normal ROM CARD: RRR; S1 and S2 appreciated RESP: Normal chest excursion without splinting or tachypnea; breath sounds clear and equal bilaterally; no wheezes, no rhonchi, no rales, no hypoxia or respiratory distress, speaking full sentences ABD/GI: Non-distended; soft, non-tender, no rebound, no guarding, no peritoneal signs GU: Patient left labia majora is swollen with some mild induration but no significant erythema or warmth.  There is 1 small central area that is slightly fluctuant. BACK: The back appears normal EXT: Normal ROM in all joints; no deformity noted, no edema SKIN: Normal color for age and race; warm; no rash on exposed skin NEURO: Moves all extremities equally, normal speech PSYCH: The patient's mood and manner are appropriate.   ED Results / Procedures / Treatments   LABS: (all labs ordered are listed, but only abnormal results are displayed) Labs Reviewed - No data to display   EKG:  RADIOLOGY: My personal review and interpretation of imaging:    I have personally reviewed all radiology reports.   No results found.   PROCEDURES:  Critical Care performed: No   INCISION AND DRAINAGE Performed by: Cyril Mourning Armandina Iman Consent: Verbal consent obtained. Risks and benefits: risks, benefits and alternatives were discussed Type: abscess  Body area: LEFT LABIA  Anesthesia: local infiltration  Incision was made with a scalpel.  Local anesthetic: lidocaine 1% without epinephrine  Anesthetic total: 4 ml  Complexity: complex Blunt dissection to break up loculations  Drainage: purulent  Drainage amount: small  Packing material: none  Patient tolerance: Patient tolerated the procedure well with no immediate complications.     Procedures    IMPRESSION / MDM /  ASSESSMENT AND PLAN / ED COURSE  I reviewed the triage vital signs and the nursing notes.    Patient here with small abscess and possible surrounding cellulitis to the left labia majora.     DIFFERENTIAL DIAGNOSIS (includes but not limited to):   Labial abscess, labial cellulitis, no signs of necrotizing fasciitis, sepsis   Patient's presentation is most consistent with acute complicated illness / injury requiring diagnostic workup.   PLAN: Will perform incision and drainage.  I think she will need to be on antibiotics given she does have some signs of early cellulitis.  Otherwise well-appearing, afebrile, nontoxic.   MEDICATIONS GIVEN IN ED: Medications  lidocaine (PF) (XYLOCAINE) 1 % injection 5 mL (5 mLs Intradermal Given by Other 10/08/22 0321)  doxycycline (VIBRA-TABS) tablet 100 mg (100 mg Oral Given 10/08/22 0320)  ED COURSE: Patient tolerated I&D well.  Only small amount of return of purulent drainage.  Discussed with patient that this seems to be more cellulitis and will discharge on doxycycline.  She is comfortable with using Tylenol, Motrin over-the-counter and declines narcotic pain medication.  Discussed return precautions.  She has outpatient follow-up.   At this time, I do not feel there is any life-threatening condition present. I reviewed all nursing notes, vitals, pertinent previous records.  All lab and urine results, EKGs, imaging ordered have been independently reviewed and interpreted by myself.  I reviewed all available radiology reports from any imaging ordered this visit.  Based on my assessment, I feel the patient is safe to be discharged home without further emergent workup and can continue workup as an outpatient as needed. Discussed all findings, treatment plan as well as usual and customary return precautions.  They verbalize understanding and are comfortable with this plan.  Outpatient follow-up has been provided as needed.  All questions have been  answered.    CONSULTS:  none   OUTSIDE RECORDS REVIEWED: Reviewed last dermatology note on 08/20/2022.       FINAL CLINICAL IMPRESSION(S) / ED DIAGNOSES   Final diagnoses:  Labial abscess  Cellulitis of labia     Rx / DC Orders   ED Discharge Orders          Ordered    doxycycline (ADOXA) 100 MG tablet  2 times daily        10/08/22 0408    ondansetron (ZOFRAN-ODT) 4 MG disintegrating tablet  Every 6 hours PRN        10/08/22 0408             Note:  This document was prepared using Dragon voice recognition software and may include unintentional dictation errors.   Martell Mcfadyen, Delice Bison, DO 10/08/22 [phone]

## 2022-10-08 NOTE — ED Triage Notes (Signed)
Pt presents ambulatory to triage via POV with complaints of vaginal abscess that she developed 5-6 days ago. She notes tyring to lance it herself at home without success. A&Ox4 at this time. Denies fevers, discharge, N/V/D, CP or SOB.

## 2022-10-08 NOTE — ED Notes (Signed)
Pt Dc to home. Dc instructions reviewed with all questions answered. Pt voices understanding. Pt ambulatory out of dept with steady gait 

## 2022-11-26 ENCOUNTER — Telehealth: Payer: Medicaid Other | Admitting: Nurse Practitioner

## 2022-11-26 DIAGNOSIS — H1010 Acute atopic conjunctivitis, unspecified eye: Secondary | ICD-10-CM

## 2022-11-26 MED ORDER — OLOPATADINE HCL 0.1 % OP SOLN: 1.0000 [drp] | Freq: Two times a day (BID) | OPHTHALMIC | 12 refills | Status: DC

## 2022-11-26 NOTE — Progress Notes (Signed)
Brooke Aguirre,  It sounds like you are experiencing allergic conjunctivitis that was possibly initiated by the oral medication coming in contact with your eye.   We will prescribe some eye drops for allergies: Meds ordered this encounter  Medications   olopatadine (PATANOL) 0.1 % ophthalmic solution    Sig: Place 1 drop into both eyes 2 (two) times daily.    Dispense:  5 mL    Refill:  12     I recommend the following over the counter treatments: Allegra 60 mg twice daily     Home Care: Wash your hands often! Do not wear your contacts until you complete your treatment plan.  Get Help Right Away If: Your symptoms do not improve. You develop blurred or loss of vision. Your symptoms worsen (increased discharge, pain or redness)   Thank you for choosing an e-visit.  Your e-visit answers were reviewed by a board certified advanced clinical practitioner to complete your personal care plan. Depending upon the condition, your plan could have included both over the counter or prescription medications.  Please review your pharmacy choice. Make sure the pharmacy is open so you can pick up prescription now. If there is a problem, you may contact your provider through [employer] and have the prescription routed to another pharmacy.  Your safety is important to [place]. If you have drug allergies check your prescription carefully.   For the next 24 hours you can use MyChart to ask questions about today's visit, request a non-urgent call back, or ask for a work or school excuse. You will get an email in the next two days asking about your experience. I hope that your e-visit has been valuable and will speed your recovery.   I spent approximately 5 minutes reviewing the patient's history, current symptoms and coordinating their care today.

## 2022-12-12 ENCOUNTER — Telehealth: Payer: Medicaid Other | Admitting: Physician Assistant

## 2022-12-12 DIAGNOSIS — J208 Acute bronchitis due to other specified organisms: Secondary | ICD-10-CM

## 2022-12-12 DIAGNOSIS — J4531 Mild persistent asthma with (acute) exacerbation: Secondary | ICD-10-CM | POA: Diagnosis not present

## 2022-12-12 MED ORDER — PREDNISONE 20 MG PO TABS: 40.0000 mg | ORAL_TABLET | Freq: Every day | ORAL | 0 refills | Status: DC

## 2022-12-12 MED ORDER — BENZONATATE 100 MG PO CAPS: 100.0000 mg | ORAL_CAPSULE | Freq: Three times a day (TID) | ORAL | 0 refills | Status: DC | PRN

## 2022-12-12 NOTE — Progress Notes (Signed)
E-Visit for Cough  We are sorry that you are not feeling well.  Here is how we plan to help!  Based on your presentation I believe you most likely have A cough due to a virus.  This is called viral bronchitis and is best treated by rest, plenty of fluids and control of the cough.  You may use Ibuprofen or Tylenol as directed to help your symptoms.     In addition you may use A non-prescription cough medication called Mucinex DM: take 2 tablets every 12 hours. and A prescription cough medication called Tessalon Perles 100mg . You may take 1-2 capsules every 8 hours as needed for your cough.  Prednisone 20mg  Take 2 tablets (40mg ) daily for 5 days  From your responses in the eVisit questionnaire you describe inflammation in the upper respiratory tract which is causing a significant cough.  This is commonly called Bronchitis and has four common causes:   Allergies Viral Infections Acid Reflux Bacterial Infection Allergies, viruses and acid reflux are treated by controlling symptoms or eliminating the cause. An example might be a cough caused by taking certain blood pressure medications. You stop the cough by changing the medication. Another example might be a cough caused by acid reflux. Controlling the reflux helps control the cough.  USE OF BRONCHODILATOR ("RESCUE") INHALERS: There is a risk from using your bronchodilator too frequently.  The risk is that over-reliance on a medication which only relaxes the muscles surrounding the breathing tubes can reduce the effectiveness of medications prescribed to reduce swelling and congestion of the tubes themselves.  Although you feel brief relief from the bronchodilator inhaler, your asthma may actually be worsening with the tubes becoming more swollen and filled with mucus.  This can delay other crucial treatments, such as oral steroid medications. If you need to use a bronchodilator inhaler daily, several times per day, you should discuss this with your  provider.  There are probably better treatments that could be used to keep your asthma under control.     HOME CARE Only take medications as instructed by your medical team. Complete the entire course of an antibiotic. Drink plenty of fluids and get plenty of rest. Avoid close contacts especially the very young and the elderly Cover your mouth if you cough or cough into your sleeve. Always remember to wash your hands A steam or ultrasonic humidifier can help congestion.   GET HELP RIGHT AWAY IF: You develop worsening fever. You become short of breath You cough up blood. Your symptoms persist after you have completed your treatment plan MAKE SURE YOU  Understand these instructions. Will watch your condition. Will get help right away if you are not doing well or get worse.    Thank you for choosing an e-visit.  Your e-visit answers were reviewed by a board certified advanced clinical practitioner to complete your personal care plan. Depending upon the condition, your plan could have included both over the counter or prescription medications.  Please review your pharmacy choice. Make sure the pharmacy is open so you can pick up prescription now. If there is a problem, you may contact your provider through [employer] and have the prescription routed to another pharmacy.  Your safety is important to [place]. If you have drug allergies check your prescription carefully.   For the next 24 hours you can use MyChart to ask questions about today's visit, request a non-urgent call back, or ask for a work or school excuse. You will get an  email in the next two days asking about your experience. I hope that your e-visit has been valuable and will speed your recovery.  I have spent 5 minutes in review of e-visit questionnaire, review and updating patient chart, medical decision making and response to patient.   Margaretann Loveless, PA-C

## 2022-12-14 ENCOUNTER — Telehealth: Payer: Medicaid Other | Admitting: Physician Assistant

## 2022-12-14 ENCOUNTER — Other Ambulatory Visit: Payer: Self-pay

## 2022-12-14 ENCOUNTER — Ambulatory Visit
Admission: EM | Admit: 2022-12-14 | Discharge: 2022-12-14 | Disposition: A | Discharge status: Home / Self Care | Payer: Medicaid Other

## 2022-12-14 ENCOUNTER — Emergency Department
Admission: EM | Admit: 2022-12-14 | Discharge: 2022-12-14 | Disposition: A | Discharge status: Home / Self Care | Payer: Medicaid Other | Attending: Emergency Medicine | Admitting: Emergency Medicine

## 2022-12-14 DIAGNOSIS — Z20822 Contact with and (suspected) exposure to covid-19: Secondary | ICD-10-CM | POA: Diagnosis not present

## 2022-12-14 DIAGNOSIS — J101 Influenza due to other identified influenza virus with other respiratory manifestations: Secondary | ICD-10-CM | POA: Diagnosis not present

## 2022-12-14 DIAGNOSIS — R509 Fever, unspecified: Secondary | ICD-10-CM | POA: Diagnosis present

## 2022-12-14 DIAGNOSIS — R051 Acute cough: Secondary | ICD-10-CM

## 2022-12-14 LAB — GROUP A STREP BY PCR: Group A Strep by PCR: NOT DETECTED

## 2022-12-14 LAB — RESP PANEL BY RT-PCR (RSV, FLU A&B, COVID)  RVPGX2
Influenza A by PCR: POSITIVE — AB
Influenza B by PCR: NEGATIVE
Resp Syncytial Virus by PCR: NEGATIVE
SARS Coronavirus 2 by RT PCR: NEGATIVE

## 2022-12-14 NOTE — ED Provider Notes (Signed)
[facility] Provider Note    None    (approximate)   History   Fever   HPI  Brooke Aguirre is a 27 y.o. female  presents with URI symptoms.  Patient states that she was seen at urgent care where she was prescribed prednisone and Tessalon Perles.  Reports that she was not tested for anything and the prednisone was for bronchitis which she has had in the past.  She also presents with her daughter who has similar symptoms.     Physical Exam   Triage Vital Signs: ED Triage Vitals  Enc Vitals Group     BP 12/14/22 1613 (!) 154/92     Pulse Rate 12/14/22 1613 100     Resp 12/14/22 1613 16     Temp 12/14/22 1613 98.2 F (36.8 C)     Temp Source 12/14/22 1613 Oral     SpO2 12/14/22 1613 96 %     Weight 12/14/22 1614 209 lb 7 oz (95 kg)     Height 12/14/22 1614 4\' 11"  (1.499 m)     Head Circumference --      Peak Flow --      Pain Score 12/14/22 1614 0     Pain Loc --      Pain Edu? --      Excl. in GC? --     Most recent vital signs: Vitals:   12/14/22 1613  BP: (!) 154/92  Pulse: 100  Resp: 16  Temp: 98.2 F (36.8 C)  SpO2: 96%     General: Awake, no distress.  Able to talk in complete sentences without any shortness of breath. CV:  Good peripheral perfusion.  Regular rate and rhythm. Resp:  Normal effort.  Lungs with occasional expiratory wheeze. Abd:  No distention.  Other:     ED Results / Procedures / Treatments   Labs (all labs ordered are listed, but only abnormal results are displayed) Labs Reviewed  RESP PANEL BY RT-PCR (RSV, FLU A&B, COVID)  RVPGX2 - Abnormal; Notable for the following components:      Result Value   Influenza A by PCR POSITIVE (*)    All other components within normal limits  GROUP A STREP BY PCR       PROCEDURES:  Critical Care performed:   Procedures   MEDICATIONS ORDERED IN ED: Medications - No data to display   IMPRESSION / MDM / ASSESSMENT AND PLAN / ED COURSE  I reviewed the  triage vital signs and the nursing notes.   Differential diagnosis includes, but is not limited to, upper respiratory infection, COVID, influenza, bronchitis, seasonal allergies.  27 year old female presents to the ED with complaint of fever, cough, congestion and currently was prescribed Tessalon Perles and prednisone.  Patient was made aware that she tested positive for influenza A.  Patient was encouraged to take Tylenol or ibuprofen as needed and increase fluids to stay hydrated.  She is going to finish with the 1 day left on her prednisone and continue Tessalon Perles as needed.    Patient's presentation is most consistent with acute illness / injury with system symptoms.  FINAL CLINICAL IMPRESSION(S) / ED DIAGNOSES   Final diagnoses:  Influenza A     Rx / DC Orders   ED Discharge Orders     None        Note:  This document was prepared using Dragon voice recognition software and may include unintentional dictation errors.   Levada Schilling,  Kallie Locks, PA-C 12/14/22 1933    Concha Se, MD 12/15/22 [phone]

## 2022-12-14 NOTE — Discharge Instructions (Signed)
Follow up with your doctor if any continued problems Tylenol or ibuprofen for aches. Increase fluids

## 2022-12-14 NOTE — ED Provider Triage Note (Signed)
Emergency Medicine Provider Triage Evaluation Note  Brooke Aguirre , a 27 y.o. female  was evaluated in triage.  Pt complains of bodyaches, fever, cough for the past few days. Daughter with similar symptoms. E-visit on Friday and has been taking prednisone, tessalon, and tylenol without relief..   Physical Exam  BP (!) 154/92   Pulse 100   Temp 98.2 F (36.8 C) (Oral)   Resp 16   Ht 4\' 11"  (1.499 m)   Wt 95 kg   SpO2 96%   BMI 42.30 kg/m  Gen:   Awake, no distress   Resp:  Normal effort  MSK:   Moves extremities without difficulty  Other:    Medical Decision Making  Medically screening exam initiated at 4:16 PM.  Appropriate orders placed.  Brooke Aguirre was informed that the remainder of the evaluation will be completed by another provider, this initial triage assessment does not replace that evaluation, and the importance of remaining in the ED until their evaluation is complete.    Brooke Pester, FNP 12/14/22 1642

## 2022-12-14 NOTE — ED Triage Notes (Signed)
Pt to ED from urgent care for fever, cough and congestion. Pt is CAOx4, in no acute distress and ambulatory in triage. She had a virtual visit on Friday and was prescribed prednisone.

## 2022-12-14 NOTE — Progress Notes (Signed)
Because you have recently been prescribed tessalon and prednisone and are still experiencing symptoms, I feel your condition warrants further evaluation and I recommend that you be seen in a face to face visit.   NOTE: There will be NO CHARGE for this eVisit   If you are having a true medical emergency please call 911.      For an urgent face to face visit, [place] has eight urgent care centers for your convenience:   NEW!! [facility] Health Urgent Care Center at [facility] Get Driving Directions [phone] [address], [address] [place], [address]    [facility] at [facility] Get Driving Directions [phone] [address] [place], [place] [address]   [facility] Health Urgent Care Center [facility]) Get Driving Directions [phone] [address] [place], [place] [address]  [facility] Health Urgent Care Center [facility]) Get Driving Directions [phone] [address] [place],  [place]  [address]  [facility] Health Urgent Care Center [facility] - at [employer]  [phone] [place],  [place] [address]   [facility] Health Urgent Care at [facility] Get Driving Directions [phone] [address] [place], [place] [address]   [facility] Health Urgent Care at [facility] Get Driving Directions  [phone] [address] [place], [place] [address]   [facility] Health Urgent Care at [facility] Directions [phone] [address] [place], [place] [address]  Your MyChart E-visit questionnaire answers were reviewed by a board certified advanced clinical practitioner to complete your personal care plan based on your specific symptoms.  Thank you for using e-Visits.

## 2022-12-14 NOTE — ED Triage Notes (Signed)
Left before being seen by provider due to daughter's behavior.

## 2022-12-19 ENCOUNTER — Telehealth: Payer: Medicaid Other | Admitting: Family Medicine

## 2022-12-19 DIAGNOSIS — J019 Acute sinusitis, unspecified: Secondary | ICD-10-CM | POA: Diagnosis not present

## 2022-12-19 DIAGNOSIS — B9689 Other specified bacterial agents as the cause of diseases classified elsewhere: Secondary | ICD-10-CM | POA: Diagnosis not present

## 2022-12-19 MED ORDER — DOXYCYCLINE HYCLATE 100 MG PO TABS
100.0000 mg | ORAL_TABLET | Freq: Two times a day (BID) | ORAL | 0 refills | Status: AC
Start: 2022-12-19 — End: 2022-12-29

## 2022-12-19 NOTE — Progress Notes (Signed)

## 2023-01-12 ENCOUNTER — Encounter: Payer: Self-pay | Admitting: Emergency Medicine

## 2023-01-12 ENCOUNTER — Emergency Department: Payer: Medicaid Other

## 2023-01-12 ENCOUNTER — Emergency Department
Admission: EM | Admit: 2023-01-12 | Discharge: 2023-01-12 | Disposition: A | Discharge status: Home / Self Care | Payer: Medicaid Other | Attending: Emergency Medicine | Admitting: Emergency Medicine

## 2023-01-12 ENCOUNTER — Other Ambulatory Visit: Payer: Self-pay

## 2023-01-12 DIAGNOSIS — S2232XA Fracture of one rib, left side, initial encounter for closed fracture: Secondary | ICD-10-CM | POA: Insufficient documentation

## 2023-01-12 DIAGNOSIS — W07XXXA Fall from chair, initial encounter: Secondary | ICD-10-CM | POA: Insufficient documentation

## 2023-01-12 DIAGNOSIS — Y92008 Other place in unspecified non-institutional (private) residence as the place of occurrence of the external cause: Secondary | ICD-10-CM | POA: Diagnosis not present

## 2023-01-12 DIAGNOSIS — S20229A Contusion of unspecified back wall of thorax, initial encounter: Secondary | ICD-10-CM | POA: Insufficient documentation

## 2023-01-12 DIAGNOSIS — W19XXXA Unspecified fall, initial encounter: Secondary | ICD-10-CM

## 2023-01-12 DIAGNOSIS — S299XXA Unspecified injury of thorax, initial encounter: Secondary | ICD-10-CM | POA: Diagnosis present

## 2023-01-12 DIAGNOSIS — S2020XA Contusion of thorax, unspecified, initial encounter: Secondary | ICD-10-CM

## 2023-01-12 MED ORDER — MELOXICAM 15 MG PO TABS: 15.0000 mg | ORAL_TABLET | Freq: Every day | ORAL | 0 refills | Status: AC

## 2023-01-12 MED ORDER — BACLOFEN 10 MG PO TABS: 10.0000 mg | ORAL_TABLET | Freq: Three times a day (TID) | ORAL | 0 refills | Status: AC

## 2023-01-12 NOTE — ED Triage Notes (Signed)
Patient to ED via POV for a fall from Friday. Patient states she fell off porch while sitting in a chair. Patient c/o left sided rib/back pain. Denies LOC or blood thinners. Pain worse when taking deep breaths.

## 2023-01-12 NOTE — ED Notes (Signed)
Patient transported to X-ray 

## 2023-01-12 NOTE — ED Provider Notes (Signed)
[facility] Provider Note    Event Date/Time   First MD Initiated Contact with Patient 01/12/23 1829     (approximate)   History   Fall   HPI  Brooke Aguirre is a 27 y.o. female medical history presents emergency department complaining of rib pain from a fall 3 days ago.  Patient states she was sitting in a chair leaning back and fell off her porch about 3 feet landing on her back.  Complaining of pain with a deep breath.  States ribs are tender to palpation but she does hurt when she takes deep breath.  Patient is also taking Ozempic even though it is not prescribed to her.      Physical Exam   Triage Vital Signs: ED Triage Vitals [01/12/23 1818]  Enc Vitals Group     BP 119/81     Pulse Rate 92     Resp 18     Temp 98.7 F (37.1 C)     Temp Source Oral     SpO2 97 %     Weight 218 lb (98.9 kg)     Height 4\' 11"  (1.499 m)     Head Circumference      Peak Flow      Pain Score 9     Pain Loc      Pain Edu?      Excl. in GC?     Most recent vital signs: Vitals:   01/12/23 1818  BP: 119/81  Pulse: 92  Resp: 18  Temp: 98.7 F (37.1 C)  SpO2: 97%     General: Awake, no distress.   CV:  Good peripheral perfusion. regular rate and  rhythm Resp:  Normal effort. Lungs cta , ribs nontender Abd:  No distention.   Other:      ED Results / Procedures / Treatments   Labs (all labs ordered are listed, but only abnormal results are displayed) Labs Reviewed - No data to display   EKG     RADIOLOGY Left ribs with     PROCEDURES:   Procedures   MEDICATIONS ORDERED IN ED: Medications - No data to display   IMPRESSION / MDM / ASSESSMENT AND PLAN / ED COURSE  I reviewed the triage vital signs and the nursing notes.                              Differential diagnosis includes, but is not limited to, rib fracture, contusion, pneumothorax  Patient's presentation is most consistent with acute presentation with potential  threat to life or bodily function.   X-ray of the left ribs with chest shows a possible eighth rib fracture, this was independently reviewed and interpreted by me  I did explain findings to the patient.  Placed her on meloxicam and baclofen.  She is to take over-the-counter Tylenol if needed.  Follow-up with her regular doctor if not improved in 3 days.  Return if worsening.  Take a deep breath often to keep her lungs clear.  Patient is in agreement treatment plan.  Was discharged stable condition.      FINAL CLINICAL IMPRESSION(S) / ED DIAGNOSES   Final diagnoses:  Fall, initial encounter  Contusion of multiple sites of trunk, initial encounter  Closed fracture of one rib of left side, initial encounter     Rx / DC Orders   ED Discharge Orders  Ordered    baclofen (LIORESAL) 10 MG tablet  3 times daily        01/12/23 1922    meloxicam (MOBIC) 15 MG tablet  Daily        01/12/23 1922             Note:  This document was prepared using Dragon voice recognition software and may include unintentional dictation errors.    Faythe Ghee, PA-C 01/12/23 Rowland Lathe, MD 01/13/23 1048

## 2023-01-12 NOTE — Discharge Instructions (Signed)
Follow-up with your regular doctor if not improving to 3 days.  Return if worsening.  Take medication as prescribed.

## 2023-02-02 ENCOUNTER — Telehealth: Payer: Self-pay

## 2023-02-02 NOTE — Telephone Encounter (Signed)
LVM for patient to call back [phone], or to call PCP office to schedule follow up apt. AS, CMA  

## 2023-03-28 ENCOUNTER — Telehealth: Payer: Medicaid Other | Admitting: Physician Assistant

## 2023-03-28 DIAGNOSIS — H1031 Unspecified acute conjunctivitis, right eye: Secondary | ICD-10-CM

## 2023-03-28 MED ORDER — POLYMYXIN B-TRIMETHOPRIM 10000-0.1 UNIT/ML-% OP SOLN
OPHTHALMIC | 0 refills | Status: DC
Start: 2023-03-28 — End: 2023-12-01

## 2023-03-28 NOTE — Progress Notes (Signed)
I have spent 5 minutes in review of e-visit questionnaire, review and updating patient chart, medical decision making and response to patient.   Mia Milan Cody Jacklynn Dehaas, PA-C    

## 2023-03-28 NOTE — Progress Notes (Signed)

## 2023-03-30 ENCOUNTER — Telehealth: Payer: Self-pay

## 2023-03-30 NOTE — Telephone Encounter (Signed)
LVM for patient to call back [phone], or to call PCP office to schedule follow up apt. AS, CMA  

## 2023-05-28 ENCOUNTER — Telehealth: Payer: Self-pay | Admitting: Physician Assistant

## 2023-05-28 DIAGNOSIS — B9689 Other specified bacterial agents as the cause of diseases classified elsewhere: Secondary | ICD-10-CM

## 2023-05-28 DIAGNOSIS — J019 Acute sinusitis, unspecified: Secondary | ICD-10-CM

## 2023-05-28 MED ORDER — DOXYCYCLINE HYCLATE 100 MG PO TABS
100.0000 mg | ORAL_TABLET | Freq: Two times a day (BID) | ORAL | 0 refills | Status: DC
Start: 2023-05-28 — End: 2023-12-01

## 2023-05-28 MED ORDER — BENZONATATE 100 MG PO CAPS
100.0000 mg | ORAL_CAPSULE | Freq: Three times a day (TID) | ORAL | 0 refills | Status: DC | PRN
Start: 2023-05-28 — End: 2023-12-01

## 2023-05-28 NOTE — Progress Notes (Signed)
I have spent 5 minutes in review of e-visit questionnaire, review and updating patient chart, medical decision making and response to patient.   Mia Milan Cody Jacklynn Dehaas, PA-C    

## 2023-05-28 NOTE — Progress Notes (Signed)

## 2023-06-24 ENCOUNTER — Encounter: Payer: Self-pay | Admitting: Emergency Medicine

## 2023-06-24 ENCOUNTER — Emergency Department: Payer: Self-pay

## 2023-06-24 ENCOUNTER — Emergency Department
Admission: EM | Admit: 2023-06-24 | Discharge: 2023-06-25 | Disposition: A | Discharge status: Home / Self Care | Payer: Self-pay | Attending: Emergency Medicine | Admitting: Emergency Medicine

## 2023-06-24 ENCOUNTER — Other Ambulatory Visit: Payer: Self-pay

## 2023-06-24 DIAGNOSIS — E039 Hypothyroidism, unspecified: Secondary | ICD-10-CM | POA: Insufficient documentation

## 2023-06-24 DIAGNOSIS — M7989 Other specified soft tissue disorders: Secondary | ICD-10-CM

## 2023-06-24 DIAGNOSIS — R600 Localized edema: Secondary | ICD-10-CM | POA: Insufficient documentation

## 2023-06-24 DIAGNOSIS — N189 Chronic kidney disease, unspecified: Secondary | ICD-10-CM | POA: Insufficient documentation

## 2023-06-24 DIAGNOSIS — R7989 Other specified abnormal findings of blood chemistry: Secondary | ICD-10-CM | POA: Insufficient documentation

## 2023-06-24 LAB — CBC WITH DIFFERENTIAL/PLATELET
Abs Immature Granulocytes: 0.03 10*3/uL (ref 0.00–0.07)
Basophils Absolute: 0.1 10*3/uL (ref 0.0–0.1)
Basophils Relative: 1 %
Eosinophils Absolute: 0.1 10*3/uL (ref 0.0–0.5)
Eosinophils Relative: 1 %
HCT: 42.1 % (ref 36.0–46.0)
Hemoglobin: 15 g/dL (ref 12.0–15.0)
Immature Granulocytes: 0 %
Lymphocytes Relative: 32 %
Lymphs Abs: 3.3 10*3/uL (ref 0.7–4.0)
MCH: 32.5 pg (ref 26.0–34.0)
MCHC: 35.6 g/dL (ref 30.0–36.0)
MCV: 91.3 fL (ref 80.0–100.0)
Monocytes Absolute: 0.5 10*3/uL (ref 0.1–1.0)
Monocytes Relative: 5 %
Neutro Abs: 6.2 10*3/uL (ref 1.7–7.7)
Neutrophils Relative %: 61 %
Platelets: 239 10*3/uL (ref 150–400)
RBC: 4.61 MIL/uL (ref 3.87–5.11)
RDW: 12 % (ref 11.5–15.5)
WBC: 10.3 10*3/uL (ref 4.0–10.5)
nRBC: 0 % (ref 0.0–0.2)

## 2023-06-24 LAB — URINALYSIS, ROUTINE W REFLEX MICROSCOPIC
Bilirubin Urine: NEGATIVE
Glucose, UA: NEGATIVE mg/dL
Hgb urine dipstick: NEGATIVE
Ketones, ur: NEGATIVE mg/dL
Leukocytes,Ua: NEGATIVE
Nitrite: NEGATIVE
Protein, ur: NEGATIVE mg/dL
Specific Gravity, Urine: 1.004 — ABNORMAL LOW (ref 1.005–1.030)
pH: 6 (ref 5.0–8.0)

## 2023-06-24 LAB — BASIC METABOLIC PANEL
Anion gap: 8 (ref 5–15)
BUN: 10 mg/dL (ref 6–20)
CO2: 23 mmol/L (ref 22–32)
Calcium: 9.1 mg/dL (ref 8.9–10.3)
Chloride: 105 mmol/L (ref 98–111)
Creatinine, Ser: 0.69 mg/dL (ref 0.44–1.00)
GFR, Estimated: >60 mL/min (ref >60)
Glucose, Bld: 153 mg/dL — ABNORMAL HIGH (ref 70–99)
Potassium: 3.4 mmol/L — ABNORMAL LOW (ref 3.5–5.1)
Sodium: 136 mmol/L (ref 135–145)

## 2023-06-24 LAB — D-DIMER, QUANTITATIVE: D-Dimer, Quant: 0.79 ug{FEU}/mL — ABNORMAL HIGH (ref 0.00–0.50)

## 2023-06-24 LAB — POC URINE PREG, ED: Preg Test, Ur: NEGATIVE

## 2023-06-24 NOTE — ED Triage Notes (Signed)
Patient ambulatory to triage with complaints of right leg swelling and pain from foot to the knee. Patient denies swelling/warmth/redness in one area. States this has been occurring for 2 days, denies injury/trauma. States it hurts worse when up and moving around or to touch.

## 2023-06-25 NOTE — Discharge Instructions (Signed)
Wear TED hose while awake.  Elevate legs at night while asleep.  Return to the ER for worsening symptoms, persistent vomiting, difficulty breathing, chest pain or other concerns.

## 2023-06-25 NOTE — ED Provider Notes (Signed)
[facility] Provider Note    Event Date/Time   First MD Initiated Contact with Patient 06/25/23 0003     (approximate)   History   Leg Pain and Leg Swelling   HPI  Brooke Aguirre is a 27 y.o. female  who presents to the ED from home with a chief complaint of right leg swelling x 2 days. Denies fall/trauma/injury. Denies associated fever/chills, chest pain, shortness of breath, abdominal pain, nausea, vomiting or diarrhea. Denies recent travel. Hurts worse to touch and with movement.       Past Medical History   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    Hypothyroidism    Kidney stone    Thyroid goiter      Active Problem List   Patient Active Problem List   Diagnosis Date Noted   Non-reassuring electronic fetal monitoring tracing 10/10/2021   Decreased fetal movement 10/09/2021   Supervision of other high risk pregnancies, third trimester 09/20/2021   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   Obesity affecting pregnancy in second trimester 06/26/2021   Diet controlled gestational diabetes mellitus (GDM) in second trimester 06/26/2021   Gestational diabetes mellitus (GDM), antepartum 05/22/2021   Contact with or exposure to varicella 05/07/2021   Supervision of high-risk pregnancy 04/12/2021   Obesity affecting pregnancy 04/12/2021   Left nephrolithiasis 03/30/2019   Nephrolithiasis 03/29/2019   Status post LEEP (loop electrosurgical excision procedure) of cervix 05/03/2018   Dysplasia of cervix, 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   Tobacco user 02/25/2018   Heart murmur 05/03/2012   Pyelonephritis 12/02/2011     Past Surgical History   Past Surgical History:  Procedure  Laterality Date   EXTRACORPOREAL SHOCK WAVE LITHOTRIPSY Left 04/14/2019   Procedure: EXTRACORPOREAL SHOCK WAVE LITHOTRIPSY (ESWL);  Surgeon: Riki Altes, MD;  Location: [facility] ORS;  Service: Urology;  Laterality: Left;   EXTRACORPOREAL SHOCK WAVE LITHOTRIPSY Right 05/05/2019   Procedure: EXTRACORPOREAL SHOCK WAVE LITHOTRIPSY (ESWL);  Surgeon: Riki Altes, MD;  Location: [facility] ORS;  Service: Urology;  Laterality: Right;   LEEP N/A 05/03/2018   Procedure: LOOP ELECTROSURGICAL EXCISION PROCEDURE (LEEP);  Surgeon: Herold Harms, MD;  Location: [facility] ORS;  Service: Gynecology;  Laterality: N/A;   REMOVAL OF DRUG DELIVERY IMPLANT Left 12/15/2014   Procedure: REMOVAL OF DRUG DELIVERY IMPLANT;  Surgeon: Nadara Mustard, MD;  Location: [facility] ORS;  Service: Gynecology;  Laterality: Left;     Home Medications   Prior to Admission medications   Medication Sig Start Date End Date Taking? Authorizing Provider  albuterol (VENTOLIN HFA) 108 (90 Base) MCG/ACT inhaler Inhale 1-2 puffs into the lungs every 6 (six) hours as needed. 06/09/22   Mickie Bail, NP  benzonatate (TESSALON) 100 MG capsule Take 1 capsule (100 mg total) by mouth 3 (three) times daily as needed for cough. 05/28/23   Waldon Merl, PA-C  doxycycline (VIBRA-TABS) 100 MG tablet Take 1 tablet (100 mg total) by mouth 2 (two) times daily. 05/28/23   Waldon Merl, PA-C  olopatadine (PATANOL) 0.1 % ophthalmic solution Place 1 drop into both eyes 2 (two) times daily. 11/26/22   Jimmey Ralph,  Maralyn Sago, FNP  ondansetron (ZOFRAN-ODT) 4 MG disintegrating tablet Take 1 tablet (4 mg total) by mouth every 6 (six) hours as needed for nausea or vomiting. 10/08/22   Ward, Layla Maw, DO  predniSONE (DELTASONE) 20 MG tablet Take 2 tablets (40 mg total) by mouth daily with breakfast. 12/12/22   Margaretann Loveless, PA-C  trimethoprim-polymyxin b (POLYTRIM) ophthalmic solution Apply 1-2 drops into affected eye QID x 5 days. 03/28/23   Waldon Merl,  PA-C  promethazine (PHENERGAN) 25 MG tablet Take 1 tablet (25 mg total) by mouth every 8 (eight) hours as needed for nausea or vomiting. 10/18/20 11/13/20  Couture, Cortni S, PA-C     Allergies  Kiwi extract, Amoxicillin, Ciprofloxacin, Penicillins, Tramadol, Cranberry, Latex, and Sulfa antibiotics   Family History   Family History  Problem Relation Age of Onset   Diabetes Mother    Hepatitis C Mother    Hepatitis C Father    Diabetes Maternal Grandmother    Breast cancer Neg Hx    Ovarian cancer Neg Hx    Colon cancer Neg Hx      Physical Exam  Triage Vital Signs: ED Triage Vitals [06/24/23 2121]  Encounter Vitals Group     BP (!) 141/91     Systolic BP Percentile      Diastolic BP Percentile      Pulse Rate 87     Resp 18     Temp 97.8 F (36.6 C)     Temp Source Oral     SpO2 100 %     Weight 210 lb (95.3 kg)     Height 4\' 11"  (1.499 m)     Head Circumference      Peak Flow      Pain Score 5     Pain Loc      Pain Education      Exclude from Growth Chart     Updated Vital Signs: BP (!) 141/91   Pulse 87   Temp 97.8 F (36.6 C) (Oral)   Resp 18   Ht 4\' 11"  (1.499 m)   Wt 95.3 kg   LMP 05/07/2023 (Approximate)   SpO2 100%   Breastfeeding No   BMI 42.41 kg/m    General: Awake, no distress.  CV:  RRR. Good peripheral perfusion.  Resp:  Normal effort. CTAB. Abd:  Nontender. No distention.  Other:  RLE slightly more edematous than LLE but both with 1+ nonpitting edema. Right calf mild tender to palpation but supple. Symmetrically warm limbs bilaterally. 2+ femoral and distal pulses. Brisk, less than 5 second capillary refill.   ED Results / Procedures / Treatments  Labs (all labs ordered are listed, but only abnormal results are displayed) Labs Reviewed  BASIC METABOLIC PANEL - Abnormal; Notable for the following components:      Result Value   Potassium 3.4 (*)    Glucose, Bld 153 (*)    All other components within normal limits  D-DIMER,  QUANTITATIVE - Abnormal; Notable for the following components:   D-Dimer, Quant 0.79 (*)    All other components within normal limits  URINALYSIS, ROUTINE W REFLEX MICROSCOPIC - Abnormal; Notable for the following components:   Color, Urine STRAW (*)    APPearance CLEAR (*)    Specific Gravity, Urine 1.004 (*)    All other components within normal limits  CBC WITH DIFFERENTIAL/PLATELET  POC URINE PREG, ED     EKG  None   RADIOLOGY I have independently visualized  and interpreted patient's imaging study as well as noted the radiology interpretation:  [place]: No DVT  Official radiology report(s): US Venous Img Lower Unilateral Right  Result Date: 06/24/2023 CLINICAL DATA:  Right lower extremity swelling. EXAM: Right LOWER EXTREMITY VENOUS DOPPLER ULTRASOUND TECHNIQUE: Gray-scale sonography with compression, as well as color and duplex ultrasound, were performed to evaluate the deep venous system(s) from the level of the common femoral vein through the popliteal and proximal calf veins. COMPARISON:  Ultrasound dated 01/22/2017. FINDINGS: VENOUS Normal compressibility of the common femoral, superficial femoral, and popliteal veins, as well as the visualized calf veins. Visualized portions of profunda femoral vein and great saphenous vein unremarkable. No filling defects to suggest DVT on grayscale or color Doppler imaging. Doppler waveforms show normal direction of venous flow, normal respiratory plasticity and response to augmentation. Limited views of the contralateral common femoral vein are unremarkable. OTHER None. Limitations: none IMPRESSION: Negative. Electronically Signed   By: Elgie Collard M.D.   On: 06/24/2023 23:12     PROCEDURES:  Critical Care performed: No  Procedures   MEDICATIONS ORDERED IN ED: Medications - No data to display   IMPRESSION / MDM / ASSESSMENT AND PLAN / ED COURSE  I reviewed the triage vital signs and the nursing notes.                              27 year old female presenting with RLE swelling. Differential diagnosis includes but is not limited to contusion, DVT, peripheral edema, etc. I have personally reviewed patient's records and note an e-visit for sinusitis with her PCP on 05/28/2023.  Patient's presentation is most consistent with acute complicated illness / injury requiring diagnostic workup.  Laboratory results unremarkable except mildly elevated Ddimer. [place] negative for DVT. Will provide TED hose, advised elevation and close follow up with her PCP. Strict return precautions given. Patient verbalizes understanding and agrees with plan of care.  FINAL CLINICAL IMPRESSION(S) / ED DIAGNOSES   Final diagnoses:  Leg swelling     Rx / DC Orders   ED Discharge Orders     None        Note:  This document was prepared using Dragon voice recognition software and may include unintentional dictation errors.   Irean Hong, MD 06/25/23 [phone]

## 2023-09-16 ENCOUNTER — Emergency Department
Admission: EM | Admit: 2023-09-16 | Discharge: 2023-09-17 | Disposition: A | Discharge status: Home / Self Care | Payer: Self-pay | Attending: Emergency Medicine | Admitting: Emergency Medicine

## 2023-09-16 ENCOUNTER — Encounter: Payer: Self-pay | Admitting: *Deleted

## 2023-09-16 ENCOUNTER — Other Ambulatory Visit: Payer: Self-pay

## 2023-09-16 DIAGNOSIS — Z91040 Latex allergy status: Secondary | ICD-10-CM | POA: Insufficient documentation

## 2023-09-16 DIAGNOSIS — R1030 Lower abdominal pain, unspecified: Secondary | ICD-10-CM

## 2023-09-16 DIAGNOSIS — N189 Chronic kidney disease, unspecified: Secondary | ICD-10-CM | POA: Insufficient documentation

## 2023-09-16 DIAGNOSIS — K625 Hemorrhage of anus and rectum: Secondary | ICD-10-CM | POA: Insufficient documentation

## 2023-09-16 DIAGNOSIS — E039 Hypothyroidism, unspecified: Secondary | ICD-10-CM | POA: Insufficient documentation

## 2023-09-16 LAB — CBC
HCT: 42.1 % (ref 36.0–46.0)
Hemoglobin: 14.8 g/dL (ref 12.0–15.0)
MCH: 32.7 pg (ref 26.0–34.0)
MCHC: 35.2 g/dL (ref 30.0–36.0)
MCV: 93.1 fL (ref 80.0–100.0)
Platelets: 237 10*3/uL (ref 150–400)
RBC: 4.52 MIL/uL (ref 3.87–5.11)
RDW: 12.1 % (ref 11.5–15.5)
WBC: 10.2 10*3/uL (ref 4.0–10.5)
nRBC: 0 % (ref 0.0–0.2)

## 2023-09-16 LAB — URINALYSIS, ROUTINE W REFLEX MICROSCOPIC
Bilirubin Urine: NEGATIVE
Glucose, UA: NEGATIVE mg/dL
Hgb urine dipstick: NEGATIVE
Ketones, ur: NEGATIVE mg/dL
Leukocytes,Ua: NEGATIVE
Nitrite: NEGATIVE
Protein, ur: NEGATIVE mg/dL
Specific Gravity, Urine: 1.002 — ABNORMAL LOW (ref 1.005–1.030)
pH: 6 (ref 5.0–8.0)

## 2023-09-16 LAB — COMPREHENSIVE METABOLIC PANEL
ALT: 23 U/L (ref 0–44)
AST: 18 U/L (ref 15–41)
Albumin: 4.3 g/dL (ref 3.5–5.0)
Alkaline Phosphatase: 62 U/L (ref 38–126)
Anion gap: 8 (ref 5–15)
BUN: 13 mg/dL (ref 6–20)
CO2: 23 mmol/L (ref 22–32)
Calcium: 9.5 mg/dL (ref 8.9–10.3)
Chloride: 106 mmol/L (ref 98–111)
Creatinine, Ser: 0.75 mg/dL (ref 0.44–1.00)
GFR, Estimated: >60 mL/min (ref >60)
Glucose, Bld: 98 mg/dL (ref 70–99)
Potassium: 3.7 mmol/L (ref 3.5–5.1)
Sodium: 137 mmol/L (ref 135–145)
Total Bilirubin: 0.6 mg/dL (ref 0.0–1.2)
Total Protein: 7.3 g/dL (ref 6.5–8.1)

## 2023-09-16 LAB — LIPASE, BLOOD: Lipase: 33 U/L (ref 11–51)

## 2023-09-16 LAB — POC URINE PREG, ED: Preg Test, Ur: NEGATIVE

## 2023-09-16 NOTE — ED Triage Notes (Signed)
 Pt ambulatory to triage.  Pt reports abd  pain with cramps for 3 weeks.  Pt reports blood in stool today.   No hx hemorrhoids.   No vag bleeding.  Denies urinary sx.  Pt alert.

## 2023-09-16 NOTE — ED Provider Notes (Signed)
 [facility] Provider Note    Event Date/Time   First MD Initiated Contact with Patient 09/16/23 2339     (approximate)   History   Abdominal Pain   HPI {Remember to add pertinent medical, surgical, social, and/or OB history to HPI:1} Brooke Aguirre is a 28 y.o. female  ***       Past Medical History   Past Medical History:  Diagnosis Date  . Abnormal Pap smear of cervix   . Chronic kidney disease    CHRONIC  RIGHT PYELONEPHRITIS  . Family history of adverse reaction to anesthesia    PTS MOM WENT INTO [facility] AFTER HYSTERECTOMY  . Gestational diabetes   . Heart murmur   . History of lithotripsy   . HPV in female   . Hypothyroidism   . Kidney stone   . Thyroid goiter      Active Problem List   Patient Active Problem List   Diagnosis Date Noted  . Non-reassuring electronic fetal monitoring tracing 10/10/2021  . Decreased fetal movement 10/09/2021  . Supervision of other high risk pregnancies, third trimester 09/20/2021  . 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  . Obesity affecting pregnancy in second trimester 06/26/2021  . Diet controlled gestational diabetes mellitus (GDM) in second trimester 06/26/2021  . Gestational diabetes mellitus (GDM), antepartum 05/22/2021  . Contact with or exposure to varicella 05/07/2021  . Supervision of high-risk pregnancy 04/12/2021  . Obesity affecting pregnancy 04/12/2021  . Left nephrolithiasis 03/30/2019  . Nephrolithiasis 03/29/2019  . Status post LEEP (loop electrosurgical excision procedure) of cervix 05/03/2018  . 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 Tobacco user 02/25/2018  . Heart murmur 05/03/2012  . Pyelonephritis 12/02/2011     Past Surgical History   Past Surgical History:  Procedure Laterality Date  . EXTRACORPOREAL SHOCK WAVE LITHOTRIPSY Left 04/14/2019   Procedure: EXTRACORPOREAL SHOCK WAVE LITHOTRIPSY (ESWL);  Surgeon: Riki Altes, MD;  Location: [facility] ORS;   Service: Urology;  Laterality: Left;  . EXTRACORPOREAL SHOCK WAVE LITHOTRIPSY Right 05/05/2019   Procedure: EXTRACORPOREAL SHOCK WAVE LITHOTRIPSY (ESWL);  Surgeon: Riki Altes, MD;  Location: [facility] ORS;  Service: Urology;  Laterality: Right;  . LEEP N/A 05/03/2018   Procedure: LOOP ELECTROSURGICAL EXCISION PROCEDURE (LEEP);  Surgeon: Herold Harms, MD;  Location: [facility] ORS;  Service: Gynecology;  Laterality: N/A;  . REMOVAL OF DRUG DELIVERY IMPLANT Left 12/15/2014   Procedure: REMOVAL OF DRUG DELIVERY IMPLANT;  Surgeon: Nadara Mustard, MD;  Location: [facility] ORS;  Service: Gynecology;  Laterality: Left;     Home Medications   Prior to Admission medications   Medication Sig Start Date End Date Taking? Authorizing Provider  albuterol (VENTOLIN HFA) 108 (90 Base) MCG/ACT inhaler Inhale 1-2 puffs into the lungs every 6 (six) hours as needed. 06/09/22   Mickie Bail, NP  benzonatate (TESSALON) 100 MG capsule Take 1 capsule (100 mg total) by mouth 3 (three) times daily as needed for cough. 05/28/23   Waldon Merl, PA-C  doxycycline (VIBRA-TABS) 100 MG tablet Take 1 tablet (100 mg total) by mouth 2 (two) times daily. 05/28/23   Waldon Merl, PA-C  olopatadine (PATANOL) 0.1 % ophthalmic solution Place 1 drop into both eyes 2 (two) times daily. 11/26/22   Viviano Simas, FNP  ondansetron (ZOFRAN-ODT) 4 MG disintegrating tablet Take 1 tablet (4 mg total) by mouth every 6 (six) hours as needed for nausea or vomiting. 10/08/22   Ward, Layla Maw, DO  predniSONE (DELTASONE) 20 MG tablet Take 2 tablets (40 mg total) by mouth daily with breakfast. 12/12/22   Margaretann Loveless, PA-C  trimethoprim-polymyxin b (POLYTRIM) ophthalmic solution Apply 1-2 drops into affected eye QID x 5 days. 03/28/23   Waldon Merl, PA-C  promethazine (PHENERGAN) 25 MG tablet Take 1 tablet (25 mg total) by mouth every 8 (eight) hours as needed for nausea or vomiting. 10/18/20 11/13/20  Couture, Cortni S, PA-C      Allergies  Kiwi extract, Amoxicillin, Ciprofloxacin, Penicillins, Tramadol, Cranberry, Latex, and Sulfa antibiotics   Family History   Family History  Problem Relation Age of Onset  . Diabetes Mother   . Hepatitis C Mother   . Hepatitis C Father   . Diabetes Maternal Grandmother   . Breast cancer Neg Hx   . Ovarian cancer Neg Hx   . Colon cancer Neg Hx      Physical Exam  Triage Vital Signs: ED Triage Vitals  Encounter Vitals Group     BP 09/16/23 2123 (!) 144/99     Systolic BP Percentile --      Diastolic BP Percentile --      Pulse Rate 09/16/23 2123 92     Resp 09/16/23 2123 18     Temp 09/16/23 2123 97.8 F (36.6 C)     Temp Source 09/16/23 2123 Oral     SpO2 09/16/23 2123 100 %     Weight 09/16/23 2120 210 lb (95.3 kg)     Height 09/16/23 2120 4\' 11"  (1.499 m)     Head Circumference --      Peak Flow --      Pain Score 09/16/23 2120 3     Pain Loc --      Pain Education --      Exclude from Growth Chart --     Updated Vital Signs: BP (!) 144/99 (BP Location: Right Arm)   Pulse 92   Temp 97.8 F (36.6 C) (Oral)   Resp 18   Ht 4\' 11"  (1.499 m)   Wt 95.3 kg   LMP 09/02/2023 (Approximate)   SpO2 100%   BMI 42.41 kg/m   {Only need to document appropriate and relevant physical exam:1} General: Awake, no distress. *** CV:  Good peripheral perfusion. *** Resp:  Normal effort. *** Abd:  No distention. *** Other:  ***   ED Results / Procedures / Treatments  Labs (all labs ordered are listed, but only abnormal results are displayed) Labs Reviewed  URINALYSIS, ROUTINE W REFLEX MICROSCOPIC - Abnormal; Notable for the following components:      Result Value   Color, Urine COLORLESS (*)    APPearance CLEAR (*)    Specific Gravity, Urine 1.002 (*)    All other components within normal limits  POC URINE PREG, ED - Normal  LIPASE, BLOOD  COMPREHENSIVE METABOLIC PANEL  CBC     EKG  ***   RADIOLOGY *** {You MUST document your own  interpretation of imaging, as well as the fact that you reviewed the radiologist's report!:1}  Official radiology report(s): No results found.   PROCEDURES:  Critical Care performed: {CriticalCareYesNo:19197::"Yes, see critical care procedure note(s)","No"}  Procedures   MEDICATIONS ORDERED IN ED: Medications - No data to display   IMPRESSION / MDM / ASSESSMENT AND PLAN / ED COURSE  I reviewed the triage vital signs and the nursing notes.  Differential diagnosis includes, but is not limited to, ***  Patient's presentation is most consistent with {EM COPA:27473}  {If the patient is on the monitor, remove the brackets and asterisks on the sentence below and remember to document it as a Procedure as well. Otherwise delete the sentence below:1} {**The patient is on the cardiac monitor to evaluate for evidence of arrhythmia and/or significant heart rate changes.**}  {Remember to include, when applicable, any/all of the following data: independent review of imaging independent review of labs (comment specifically on pertinent positives and negatives) review of specific prior hospitalizations, PCP/specialist notes, etc. discuss meds given and prescribed document any discussion with consultants (including hospitalists) any clinical decision tools you used and why (PECARN, NEXUS, etc.) did you consider admitting the patient? document social determinants of health affecting patient's care (homelessness, inability to follow up in a timely fashion, etc) document any pre-existing conditions increasing risk on current visit (e.g. diabetes and HTN increasing danger of high-risk chest pain/ACS) describes what meds you gave (especially parenteral) and why any other interventions?:1}      FINAL CLINICAL IMPRESSION(S) / ED DIAGNOSES   Final diagnoses:  None     Rx / DC Orders   ED Discharge Orders     None        Note:  This document was prepared  using Dragon voice recognition software and may include unintentional dictation errors.

## 2023-09-17 ENCOUNTER — Emergency Department: Payer: Self-pay

## 2023-09-17 MED ORDER — IOHEXOL 300 MG/ML  SOLN: 100.0000 mL | Freq: Once | INTRAMUSCULAR | Status: DC | PRN

## 2023-09-17 MED ORDER — IOHEXOL 350 MG/ML SOLN
100.0000 mL | Freq: Once | INTRAVENOUS | Status: AC | PRN
  Administered 2023-09-17: 100 mL via INTRAVENOUS

## 2023-09-17 NOTE — Discharge Instructions (Signed)
 Please call the number provided to arrange an appointment with a GI specialist.  Avoid Ibuprofen and Aspirin products as well as alcohol.  Return to the ER for worsening symptoms, persistent vomiting, fainting or other concerns.

## 2023-12-01 ENCOUNTER — Telehealth: Payer: Self-pay | Admitting: Physician Assistant

## 2023-12-01 DIAGNOSIS — J208 Acute bronchitis due to other specified organisms: Secondary | ICD-10-CM

## 2023-12-01 DIAGNOSIS — B9689 Other specified bacterial agents as the cause of diseases classified elsewhere: Secondary | ICD-10-CM

## 2023-12-01 MED ORDER — BENZONATATE 100 MG PO CAPS
100.0000 mg | ORAL_CAPSULE | Freq: Three times a day (TID) | ORAL | 0 refills | Status: DC | PRN
Start: 2023-12-01 — End: 2024-05-19

## 2023-12-01 MED ORDER — AZITHROMYCIN 250 MG PO TABS
ORAL_TABLET | ORAL | 0 refills | Status: AC
Start: 2023-12-01 — End: 2023-12-06

## 2023-12-01 NOTE — Progress Notes (Signed)
 I have spent 5 minutes in review of e-visit questionnaire, review and updating patient chart, medical decision making and response to patient.   Piedad Climes, PA-C

## 2023-12-01 NOTE — Progress Notes (Signed)

## 2024-01-30 ENCOUNTER — Telehealth: Payer: Self-pay | Admitting: Nurse Practitioner

## 2024-01-30 DIAGNOSIS — R399 Unspecified symptoms and signs involving the genitourinary system: Secondary | ICD-10-CM

## 2024-01-30 MED ORDER — NITROFURANTOIN MONOHYD MACRO 100 MG PO CAPS
100.0000 mg | ORAL_CAPSULE | Freq: Two times a day (BID) | ORAL | 0 refills | Status: AC
Start: 2024-01-30 — End: 2024-02-04

## 2024-01-30 NOTE — Progress Notes (Signed)

## 2024-01-30 NOTE — Progress Notes (Signed)
 I have spent 5 minutes in review of e-visit questionnaire, review and updating patient chart, medical decision making and response to patient.   Claiborne Rigg, NP

## 2024-03-15 ENCOUNTER — Telehealth: Payer: Self-pay | Admitting: Physician Assistant

## 2024-03-15 DIAGNOSIS — L739 Follicular disorder, unspecified: Secondary | ICD-10-CM

## 2024-03-15 MED ORDER — DOXYCYCLINE HYCLATE 100 MG PO TABS
100.0000 mg | ORAL_TABLET | Freq: Two times a day (BID) | ORAL | 0 refills | Status: DC
Start: 2024-03-15 — End: 2024-05-23

## 2024-03-15 NOTE — Progress Notes (Signed)
 I have spent 5 minutes in review of e-visit questionnaire, review and updating patient chart, medical decision making and response to patient.   Elsie Velma Lunger, PA-C

## 2024-03-15 NOTE — Progress Notes (Signed)
E Visit for Rash  We are sorry that you are not feeling well. Here is how we plan to help!   Based upon what you have shared with me it looks like you have a bacterial follicultits.  Folliculitis is inflammation of the hair follicles that can be caused by a superficial infection of the skin and is treated with an antibiotic. I have prescribed: and Doxycycline 100 mg twice per day for 7 days     HOME CARE:  Take cool showers and avoid direct sunlight. Apply cool compress or wet dressings. Take a bath in an oatmeal bath.  Sprinkle content of one Aveeno packet under running faucet with comfortably warm water.  Bathe for 15-20 minutes, 1-2 times daily.  Pat dry with a towel. Do not rub the rash. Use hydrocortisone cream. Take an antihistamine like Benadryl for widespread rashes that itch.  The adult dose of Benadryl is 25-50 mg by mouth 4 times daily. Caution:  This type of medication may cause sleepiness.  Do not drink alcohol, drive, or operate dangerous machinery while taking antihistamines.  Do not take these medications if you have prostate enlargement.  Read package instructions thoroughly on all medications that you take.  GET HELP RIGHT AWAY IF:  Symptoms don't go away after treatment. Severe itching that persists. If you rash spreads or swells. If you rash begins to smell. If it blisters and opens or develops a yellow-brown crust. You develop a fever. You have a sore throat. You become short of breath.  MAKE SURE YOU:  Understand these instructions. Will watch your condition. Will get help right away if you are not doing well or get worse.  Thank you for choosing an e-visit.  Your e-visit answers were reviewed by a board certified advanced clinical practitioner to complete your personal care plan. Depending upon the condition, your plan could have included both over the counter or prescription medications.  Please review your pharmacy choice. Make sure the pharmacy is open  so you can pick up prescription now. If there is a problem, you may contact your provider through [employer] and have the prescription routed to another pharmacy.  Your safety is important to [place]. If you have drug allergies check your prescription carefully.   For the next 24 hours you can use MyChart to ask questions about today's visit, request a non-urgent call back, or ask for a work or school excuse. You will get an email in the next two days asking about your experience. I hope that your e-visit has been valuable and will speed your recovery.

## 2024-04-07 ENCOUNTER — Other Ambulatory Visit: Payer: Self-pay

## 2024-04-07 ENCOUNTER — Emergency Department: Payer: Self-pay

## 2024-04-07 ENCOUNTER — Encounter: Payer: Self-pay | Admitting: Emergency Medicine

## 2024-04-07 ENCOUNTER — Emergency Department
Admission: EM | Admit: 2024-04-07 | Discharge: 2024-04-07 | Disposition: A | Discharge status: Home / Self Care | Payer: Self-pay | Attending: Emergency Medicine | Admitting: Emergency Medicine

## 2024-04-07 DIAGNOSIS — M67432 Ganglion, left wrist: Secondary | ICD-10-CM | POA: Insufficient documentation

## 2024-04-07 NOTE — ED Triage Notes (Signed)
 Pt presents to the ED via POV with complaints of L wrist pain and a small lump on the posterior side of her wrist. Lump is palpable/moveable. Rates the pain 1/10. No meds taken PTA. A&Ox4 at this time. Denies CP or SOB.

## 2024-04-07 NOTE — Discharge Instructions (Signed)
 You were evaluated in the ED for a small cystlike lump to your left breast.  Your x-ray is normal.  Your physical exam findings are reassuring.  Please review education on ganglion cyst and Raynaud's phenomenon as we discussed.  Consider wearing a wrist brace for comfort.  Follow-up with orthopedic for further management.  Please go to the following website to schedule new (and existing) patient appointments:   [email or web address]   The following is a list of primary care offices in the area who are accepting new patients at this time.  Please reach out to one of them directly and let them know you would like to schedule an appointment to follow up on an Emergency Department visit, and/or to establish a new primary care provider (PCP).  There are likely other primary care clinics in the are who are accepting new patients, but this is an excellent place to start:  [facility] Lead physician: Dr Jon Eva [address] [place], [place] [address] [phone]  [facility] Lead Physician: Dr Dorette Loron [address], [place], [place] [address] [phone]  [facility]  Lead Physician: Dr Duwaine Louder [address] [place], [place] [address] [phone]  [facility] Lead Physician: Dr Marolyn Officer [address] [place], [place] [address] [phone]  [facility] Medicine at [facility] Lead Physician: Dr Leita Adie [address] [place], [place], [place] [address] [phone]

## 2024-04-07 NOTE — ED Notes (Signed)
 PT in no acute distress prior to discharge. Discharged instructions reviewed and pt stated that they understand directions. Pt has all belongings with them at time of discharge.

## 2024-04-07 NOTE — ED Provider Notes (Signed)
 [facility] Emergency Department Provider Note     Event Date/Time   First MD Initiated Contact with Patient 04/07/24 2112     (approximate)   History   Wrist Pain   HPI  Brooke Aguirre is a 28 y.o. female with no pertinent past medical history presents to the ED for evaluation of a small nontender lump on her left wrist. patient reports she noticed this lump 2 days ago.  Denies injury or falls.  Sensation remains intact.     Physical Exam   Triage Vital Signs: ED Triage Vitals  Encounter Vitals Group     BP 04/07/24 2052 133/83     Girls Systolic BP Percentile --      Girls Diastolic BP Percentile --      Boys Systolic BP Percentile --      Boys Diastolic BP Percentile --      Pulse Rate 04/07/24 2052 96     Resp 04/07/24 2052 16     Temp 04/07/24 2052 98.3 F (36.8 C)     Temp Source 04/07/24 2052 Oral     SpO2 04/07/24 2052 96 %     Weight 04/07/24 2054 210 lb (95.3 kg)     Height 04/07/24 2054 4' 11 (1.499 m)     Head Circumference --      Peak Flow --      Pain Score 04/07/24 2054 1     Pain Loc --      Pain Education --      Exclude from Growth Chart --     Most recent vital signs: Vitals:   04/07/24 2052  BP: 133/83  Pulse: 96  Resp: 16  Temp: 98.3 F (36.8 C)  SpO2: 96%    General Awake, no distress.  CV:  Good peripheral perfusion.  RESP:  Normal effort.  ABD:  No distention.  Other:  Left wrist reveals small (pinpoint sized) well-circumscribed, mobile lump underneath dorsal aspect of wrist.  Deep.  Nontender to palpation.  No skin changes including erythema or warmth.  F ROM without difficulty.  Radial pulses 2+ bilaterally.  Good capillary refill.   ED Results / Procedures / Treatments   Labs (all labs ordered are listed, but only abnormal results are displayed) Labs Reviewed - No data to display  RADIOLOGY  I personally viewed and evaluated these images as part of my medical decision making, as well as  reviewing the written report by the radiologist.  ED Provider Interpretation: Normal left wrist x-ray  DG Wrist Complete Left Result Date: 04/07/2024 CLINICAL DATA:  Left wrist pain with a lump on the posterior side of the wrist. EXAM: LEFT WRIST - COMPLETE 3+ VIEW COMPARISON:  None Available. FINDINGS: Left wrist appears intact. No evidence of acute fracture or subluxation. No focal bone lesion or bone destruction. Bone cortex and trabecular architecture appear intact. No radiopaque soft tissue foreign bodies. IMPRESSION: No acute bony abnormalities.  Soft tissues are unremarkable. Electronically Signed   By: Elsie Gravely M.D.   On: 04/07/2024 21:18    PROCEDURES:  Critical Care performed: No  Procedures   MEDICATIONS ORDERED IN ED: Medications - No data to display   IMPRESSION / MDM / ASSESSMENT AND PLAN / ED COURSE  I reviewed the triage vital signs and the nursing notes.  28 y.o. female presents to the emergency department for evaluation and treatment of small lump to left wrist. See HPI for further details.   Differential diagnosis includes, but is not limited to ganglion cyst, lipoma, epidermoid cyst, abscess  Patient's presentation is most consistent with acute, uncomplicated illness.  Patient is alert and oriented.  She is hemodynamic stable.  Physical exam findings are stated above.  Reassuring x-rays of the left wrist.  Advised to follow-up with orthopedic for further evaluation.  In the interim advised OTC wrist brace for comfort.  Patient stable condition for discharge home.  ED return precaution discussed.  FINAL CLINICAL IMPRESSION(S) / ED DIAGNOSES   Final diagnoses:  Ganglion of left wrist   Rx / DC Orders   ED Discharge Orders     None      Note:  This document was prepared using Dragon voice recognition software and may include unintentional dictation errors.    Margrette, Rickardo Brinegar A, PA-C 04/07/24 2129    Levander Slate,  MD 04/07/24 [phone]

## 2024-05-19 ENCOUNTER — Telehealth: Payer: Self-pay | Admitting: Emergency Medicine

## 2024-05-19 DIAGNOSIS — J208 Acute bronchitis due to other specified organisms: Secondary | ICD-10-CM

## 2024-05-19 DIAGNOSIS — B9689 Other specified bacterial agents as the cause of diseases classified elsewhere: Secondary | ICD-10-CM

## 2024-05-19 MED ORDER — PREDNISONE 20 MG PO TABS: 20.0000 mg | ORAL_TABLET | Freq: Two times a day (BID) | ORAL | 0 refills | Status: DC

## 2024-05-19 MED ORDER — BENZONATATE 100 MG PO CAPS: 100.0000 mg | ORAL_CAPSULE | Freq: Three times a day (TID) | ORAL | 0 refills | Status: DC | PRN

## 2024-05-19 NOTE — Progress Notes (Signed)
 We are sorry that you are not feeling well.  Here is how we plan to help!  Based on your presentation I believe you most likely have A cough due to a virus.  This is called viral bronchitis and is best treated by rest, plenty of fluids and control of the cough.  You may use Ibuprofen  or Tylenol  as directed to help your symptoms.     In addition you may use A prescription cough medication called Tessalon  Perles 100mg . You may take 1-2 capsules every 8 hours as needed for your cough.  Prednisone  10 mg daily for 6 days (see taper instructions below)  From your responses in the eVisit questionnaire you describe inflammation in the upper respiratory tract which is causing a significant cough.  This is commonly called Bronchitis and has four common causes:   Allergies Viral Infections Acid Reflux Bacterial Infection Allergies, viruses and acid reflux are treated by controlling symptoms or eliminating the cause. An example might be a cough caused by taking certain blood pressure medications. You stop the cough by changing the medication. Another example might be a cough caused by acid reflux. Controlling the reflux helps control the cough.  USE OF BRONCHODILATOR (RESCUE) INHALERS: There is a risk from using your bronchodilator too frequently.  The risk is that over-reliance on a medication which only relaxes the muscles surrounding the breathing tubes can reduce the effectiveness of medications prescribed to reduce swelling and congestion of the tubes themselves.  Although you feel brief relief from the bronchodilator inhaler, your asthma may actually be worsening with the tubes becoming more swollen and filled with mucus.  This can delay other crucial treatments, such as oral steroid medications. If you need to use a bronchodilator inhaler daily, several times per day, you should discuss this with your provider.  There are probably better treatments that could be used to keep your asthma under control.      HOME CARE Only take medications as instructed by your medical team. Complete the entire course of an antibiotic. Drink plenty of fluids and get plenty of rest. Avoid close contacts especially the very young and the elderly Cover your mouth if you cough or cough into your sleeve. Always remember to wash your hands A steam or ultrasonic humidifier can help congestion.   GET HELP RIGHT AWAY IF: You develop worsening fever. You become short of breath You cough up blood. Your symptoms persist after you have completed your treatment plan MAKE SURE YOU  Understand these instructions. Will watch your condition. Will get help right away if you are not doing well or get worse.  Your e-visit answers were reviewed by a board certified advanced clinical practitioner to complete your personal care plan.  Depending on the condition, your plan could have included both over the counter or prescription medications. If there is a problem please reply  once you have received a response from your provider. Your safety is important to us .  If you have drug allergies check your prescription carefully.    You can use MyChart to ask questions about today's visit, request a non-urgent call back, or ask for a work or school excuse for 24 hours related to this e-Visit. If it has been greater than 24 hours you will need to follow up with your provider, or enter a new e-Visit to address those concerns. You will get an e-mail in the next two days asking about your experience.  I hope that your e-visit has been valuable  and will speed your recovery. Thank you for using e-visits.   I have spent 5 minutes in review of e-visit questionnaire, review and updating patient chart, medical decision making and response to patient.   Ruhama Lehew, FNP

## 2024-05-23 ENCOUNTER — Telehealth: Payer: Self-pay | Admitting: Physician Assistant

## 2024-05-23 DIAGNOSIS — B9689 Other specified bacterial agents as the cause of diseases classified elsewhere: Secondary | ICD-10-CM

## 2024-05-23 DIAGNOSIS — J019 Acute sinusitis, unspecified: Secondary | ICD-10-CM

## 2024-05-23 MED ORDER — DOXYCYCLINE HYCLATE 100 MG PO TABS: 100.0000 mg | ORAL_TABLET | Freq: Two times a day (BID) | ORAL | 0 refills | Status: DC

## 2024-05-23 NOTE — Progress Notes (Signed)
 E-Visit for Sinus Problems  We are sorry that you are not feeling well.  Here is how we plan to help!  Based on what you have shared with me it looks like you have sinusitis.  Sinusitis is inflammation and infection in the sinus cavities of the head.  Based on your presentation I believe you most likely have Acute Bacterial Sinusitis.  This is an infection caused by bacteria and is treated with antibiotics. I have prescribed Doxycycline 100mg  by mouth twice a day for 7 days. You may use an oral decongestant such as Mucinex D or if you have glaucoma or high blood pressure use plain Mucinex. Saline nasal spray help and can safely be used as often as needed for congestion.  If you develop worsening sinus pain, fever or notice severe headache and vision changes, or if symptoms are not better after completion of antibiotic, please schedule an appointment with a health care provider.    Sinus infections are not as easily transmitted as other respiratory infection, however we still recommend that you avoid close contact with loved ones, especially the very young and elderly.  Remember to wash your hands thoroughly throughout the day as this is the number one way to prevent the spread of infection!  Home Care: Only take medications as instructed by your medical team. Complete the entire course of an antibiotic. Do not take these medications with alcohol. A steam or ultrasonic humidifier can help congestion.  You can place a towel over your head and breathe in the steam from hot water coming from a faucet. Avoid close contacts especially the very young and the elderly. Cover your mouth when you cough or sneeze. Always remember to wash your hands.  Get Help Right Away If: You develop worsening fever or sinus pain. You develop a severe head ache or visual changes. Your symptoms persist after you have completed your treatment plan.  Make sure you Understand these instructions. Will watch your  condition. Will get help right away if you are not doing well or get worse.  Your e-visit answers were reviewed by a board certified advanced clinical practitioner to complete your personal care plan.  Depending on the condition, your plan could have included both over the counter or prescription medications.  If there is a problem please reply  once you have received a response from your provider.  Your safety is important to us .  If you have drug allergies check your prescription carefully.    You can use MyChart to ask questions about today's visit, request a non-urgent call back, or ask for a work or school excuse for 24 hours related to this e-Visit. If it has been greater than 24 hours you will need to follow up with your provider, or enter a new e-Visit to address those concerns.  You will get an e-mail in the next two days asking about your experience.  I hope that your e-visit has been valuable and will speed your recovery. Thank you for using e-visits.  I have spent 5 minutes in review of e-visit questionnaire, review and updating patient chart, medical decision making and response to patient.   Delon CHRISTELLA Dickinson, PA-C

## 2024-05-25 ENCOUNTER — Emergency Department: Payer: Self-pay

## 2024-05-25 ENCOUNTER — Other Ambulatory Visit: Payer: Self-pay

## 2024-05-25 ENCOUNTER — Encounter: Payer: Self-pay | Admitting: *Deleted

## 2024-05-25 ENCOUNTER — Emergency Department
Admission: EM | Admit: 2024-05-25 | Discharge: 2024-05-26 | Disposition: A | Discharge status: Home / Self Care | Payer: Self-pay | Attending: Emergency Medicine | Admitting: Emergency Medicine

## 2024-05-25 DIAGNOSIS — D72829 Elevated white blood cell count, unspecified: Secondary | ICD-10-CM | POA: Insufficient documentation

## 2024-05-25 DIAGNOSIS — M7989 Other specified soft tissue disorders: Secondary | ICD-10-CM | POA: Insufficient documentation

## 2024-05-25 DIAGNOSIS — R0789 Other chest pain: Secondary | ICD-10-CM | POA: Insufficient documentation

## 2024-05-25 DIAGNOSIS — R000 Tachycardia, unspecified: Secondary | ICD-10-CM | POA: Insufficient documentation

## 2024-05-25 LAB — BASIC METABOLIC PANEL WITH GFR
Anion gap: 12 (ref 5–15)
BUN: 13 mg/dL (ref 6–20)
CO2: 22 mmol/L (ref 22–32)
Calcium: 10.4 mg/dL — ABNORMAL HIGH (ref 8.9–10.3)
Chloride: 104 mmol/L (ref 98–111)
Creatinine, Ser: 0.61 mg/dL (ref 0.44–1.00)
GFR, Estimated: >60 mL/min (ref >60)
Glucose, Bld: 139 mg/dL — ABNORMAL HIGH (ref 70–99)
Potassium: 4.2 mmol/L (ref 3.5–5.1)
Sodium: 139 mmol/L (ref 135–145)

## 2024-05-25 LAB — CBC
HCT: 48.1 % — ABNORMAL HIGH (ref 36.0–46.0)
Hemoglobin: 17.1 g/dL — ABNORMAL HIGH (ref 12.0–15.0)
MCH: 32.6 pg (ref 26.0–34.0)
MCHC: 35.6 g/dL (ref 30.0–36.0)
MCV: 91.8 fL (ref 80.0–100.0)
Platelets: 329 K/uL (ref 150–400)
RBC: 5.24 MIL/uL — ABNORMAL HIGH (ref 3.87–5.11)
RDW: 12.2 % (ref 11.5–15.5)
WBC: 16.8 K/uL — ABNORMAL HIGH (ref 4.0–10.5)
nRBC: 0 % (ref 0.0–0.2)

## 2024-05-25 LAB — TROPONIN T, HIGH SENSITIVITY: Troponin T High Sensitivity: <15 ng/L (ref 0–19)

## 2024-05-25 NOTE — ED Triage Notes (Signed)
 Pt ambulatory to triage.  Pt has swelling to right foot.  Pt also has sob.  Pt denies chest pain  pt states pain in left rib area.  No injury right foot/leg  pt alert  speech clear.

## 2024-05-25 NOTE — ED Provider Notes (Signed)
 [facility] Provider Note    Event Date/Time   First MD Initiated Contact with Patient 05/25/24 2146     (approximate)   History   Leg Swelling   HPI  Brooke Aguirre is a 28 y.o. female with no significant past medical history who presents with right foot pain and swelling over the last several days, with swelling mainly to the top of the foot, but pain more along the bottom of the foot.  She denies any known trauma.  She has no weakness or numbness in the leg and foot.  There is no rash.  She denies any pain going up the leg.  She states that the same thing happened in December of last year although that time it was involving more of her leg.  The patient also reports persistent pleuritic chest discomfort, shortness of breath, and some cough after being treated for a respiratory infection.  She was on prednisone  and is taking doxycycline .  I reviewed the past medical records.  The patient had telehealth visits with family medicine on 11/6 at which point she was diagnosed with a cough due to viral URI 11/10 for sinus problems.  She was seen in the ED here on 12/12 of last year with right leg swelling.  DVT study was negative at that time.  D-dimer was slightly elevated.  She was diagnosed with nonspecific leg swelling.   Physical Exam   Triage Vital Signs: ED Triage Vitals  Encounter Vitals Group     BP 05/25/24 1944 (!) 145/89     Girls Systolic BP Percentile --      Girls Diastolic BP Percentile --      Boys Systolic BP Percentile --      Boys Diastolic BP Percentile --      Pulse Rate 05/25/24 1944 (!) 127     Resp 05/25/24 1944 17     Temp 05/25/24 1944 97.6 F (36.4 C)     Temp Source 05/25/24 1944 Oral     SpO2 05/25/24 1944 100 %     Weight 05/25/24 1952 210 lb (95.3 kg)     Height 05/25/24 1952 4' 11 (1.499 m)     Head Circumference --      Peak Flow --      Pain Score 05/25/24 1952 7     Pain Loc --      Pain Education --       Exclude from Growth Chart --     Most recent vital signs: Vitals:   05/25/24 1944  BP: (!) 145/89  Pulse: (!) 127  Resp: 17  Temp: 97.6 F (36.4 C)  SpO2: 100%     General: Awake, no distress.  CV:  Good peripheral perfusion.  Borderline tachycardic.  Normal heart sounds. Resp:  Normal effort.  Lungs CTAB. Abd:  No distention.  Other:  No peripheral edema.  Right foot with mild swelling to the dorsum and some slight tenderness.  No erythema, induration, or abnormal warmth.  Normal sensation.  Normal cap refill and DP pulse.  No calf or popliteal swelling or tenderness.   ED Results / Procedures / Treatments   Labs (all labs ordered are listed, but only abnormal results are displayed) Labs Reviewed  BASIC METABOLIC PANEL WITH GFR - Abnormal; Notable for the following components:      Result Value   Glucose, Bld 139 (*)    Calcium  10.4 (*)    All other components within normal  limits  CBC - Abnormal; Notable for the following components:   WBC 16.8 (*)    RBC 5.24 (*)    Hemoglobin 17.1 (*)    HCT 48.1 (*)    All other components within normal limits  POC URINE PREG, ED  TROPONIN T, HIGH SENSITIVITY     EKG  ED ECG REPORT I, Waylon Cassis, the attending physician, personally viewed and interpreted this ECG.  Date: 05/25/2024 EKG Time: 1950 Rate: 104 Rhythm: Sinus tachycardia QRS Axis: normal Intervals: normal ST/T Wave abnormalities: normal Narrative Interpretation: no evidence of acute ischemia    RADIOLOGY  Chest x-ray: I independently viewed and interpreted the images; there is no focal consolidation or edema  US  venous RLE: No DVT  CTA chest: Pending  PROCEDURES:  Critical Care performed: No  Procedures   MEDICATIONS ORDERED IN ED: Medications - No data to display   IMPRESSION / MDM / ASSESSMENT AND PLAN / ED COURSE  I reviewed the triage vital signs and the nursing notes.  28 year old female with PMH as noted above presents with  right foot pain and swelling as well as with persistent pleuritic chest discomfort, shortness of breath, and tachycardia after recent respiratory infection.  Differential diagnosis includes, but is not limited to:  Right foot pain: Peripheral edema, lymphedema, DVT, plantar fasciitis, strain or sprain, other inflammatory process.  DVT study was obtained from triage and is negative.  I have added on an x-ray.  Chest discomfort/shortness of breath/tachycardia: The patient was significantly tachycardic at triage although her heart rate has improved.  Differential includes persistent symptoms after recent bronchitis or other respiratory infection, less likely PE.  There is no evidence of ACS.  Troponin is negative.  CBC shows leukocytosis although this is likely related to her recent steroid use.  BMP shows no acute findings.  Since the patient has a known elevated D-dimer, this is unlikely to be diagnostically helpful.  Based on shared decision making with the patient we will obtain a CTA.  Patient's presentation is most consistent with acute presentation with potential threat to life or bodily function.  The patient is on the cardiac monitor to evaluate for evidence of arrhythmia and/or significant heart rate changes.   ----------------------------------------- 11:29 PM on 05/25/2024 -----------------------------------------  CTA is pending.  I have signed the patient out to the oncoming ED physician Dr. Cyrena.  FINAL CLINICAL IMPRESSION(S) / ED DIAGNOSES   Final diagnoses:  Atypical chest pain  Sinus tachycardia  Swelling of right foot     Rx / DC Orders   ED Discharge Orders     None        Note:  This document was prepared using Dragon voice recognition software and may include unintentional dictation errors.    Cassis Waylon, MD 05/25/24 2330

## 2024-05-25 NOTE — ED Notes (Signed)
 Poct pregnancy Negative

## 2024-05-26 ENCOUNTER — Emergency Department: Payer: Self-pay

## 2024-05-26 MED ORDER — IOHEXOL 350 MG/ML SOLN
75.0000 mL | Freq: Once | INTRAVENOUS | Status: AC | PRN
  Administered 2024-05-26: 75 mL via INTRAVENOUS

## 2024-05-26 NOTE — Discharge Instructions (Addendum)
 Fortunately your CT angiogram did not show any blood clot in your lungs.    Please see your primary doctor this week for a follow up appointment.   If you have any new, worsening, or unexpected symptoms call your doctor right away or come back to the emergency department for reevaluation.

## 2024-05-26 NOTE — ED Provider Notes (Signed)
 Signed out pending CT angiogram which shows no blood clot or emergent pathologies.  Patient discharged in stable condition.   Cyrena Mylar, MD 05/26/24 0111

## 2024-07-24 ENCOUNTER — Emergency Department
Admission: EM | Admit: 2024-07-24 | Discharge: 2024-07-24 | Disposition: A | Discharge status: Home / Self Care | Payer: Self-pay

## 2024-07-24 ENCOUNTER — Other Ambulatory Visit: Payer: Self-pay

## 2024-07-24 ENCOUNTER — Emergency Department: Payer: Self-pay

## 2024-07-24 DIAGNOSIS — D72829 Elevated white blood cell count, unspecified: Secondary | ICD-10-CM | POA: Insufficient documentation

## 2024-07-24 DIAGNOSIS — N189 Chronic kidney disease, unspecified: Secondary | ICD-10-CM | POA: Insufficient documentation

## 2024-07-24 DIAGNOSIS — J069 Acute upper respiratory infection, unspecified: Secondary | ICD-10-CM | POA: Insufficient documentation

## 2024-07-24 DIAGNOSIS — D649 Anemia, unspecified: Secondary | ICD-10-CM | POA: Insufficient documentation

## 2024-07-24 DIAGNOSIS — J45909 Unspecified asthma, uncomplicated: Secondary | ICD-10-CM | POA: Insufficient documentation

## 2024-07-24 DIAGNOSIS — J029 Acute pharyngitis, unspecified: Secondary | ICD-10-CM

## 2024-07-24 LAB — TROPONIN T, HIGH SENSITIVITY: Troponin T High Sensitivity: <15 ng/L (ref 0–19)

## 2024-07-24 LAB — BASIC METABOLIC PANEL WITH GFR
Anion gap: 13 (ref 5–15)
BUN: 10 mg/dL (ref 6–20)
CO2: 19 mmol/L — ABNORMAL LOW (ref 22–32)
Calcium: 9.9 mg/dL (ref 8.9–10.3)
Chloride: 106 mmol/L (ref 98–111)
Creatinine, Ser: 0.68 mg/dL (ref 0.44–1.00)
GFR, Estimated: >60 mL/min
Glucose, Bld: 97 mg/dL (ref 70–99)
Potassium: 3.8 mmol/L (ref 3.5–5.1)
Sodium: 138 mmol/L (ref 135–145)

## 2024-07-24 LAB — RESP PANEL BY RT-PCR (RSV, FLU A&B, COVID)  RVPGX2
Influenza A by PCR: NEGATIVE
Influenza B by PCR: NEGATIVE
Resp Syncytial Virus by PCR: NEGATIVE
SARS Coronavirus 2 by RT PCR: NEGATIVE

## 2024-07-24 LAB — CBC
HCT: 43.9 % (ref 36.0–46.0)
Hemoglobin: 15.2 g/dL — ABNORMAL HIGH (ref 12.0–15.0)
MCH: 32.5 pg (ref 26.0–34.0)
MCHC: 34.6 g/dL (ref 30.0–36.0)
MCV: 93.8 fL (ref 80.0–100.0)
Platelets: 252 K/uL (ref 150–400)
RBC: 4.68 MIL/uL (ref 3.87–5.11)
RDW: 12.2 % (ref 11.5–15.5)
WBC: 10.9 K/uL — ABNORMAL HIGH (ref 4.0–10.5)
nRBC: 0 % (ref 0.0–0.2)

## 2024-07-24 MED ORDER — PREDNISONE 20 MG PO TABS: 40.0000 mg | ORAL_TABLET | Freq: Every day | ORAL | 0 refills | Status: AC

## 2024-07-24 MED ORDER — PREDNISONE 20 MG PO TABS
60.0000 mg | ORAL_TABLET | Freq: Once | ORAL | Status: AC
  Administered 2024-07-24: 60 mg via ORAL
  Filled 2024-07-24: qty 3

## 2024-07-24 MED ORDER — ALBUTEROL SULFATE HFA 108 (90 BASE) MCG/ACT IN AERS: 2.0000 | INHALATION_SPRAY | Freq: Four times a day (QID) | RESPIRATORY_TRACT | 2 refills | Status: AC | PRN

## 2024-07-24 MED ORDER — IPRATROPIUM-ALBUTEROL 0.5-2.5 (3) MG/3ML IN SOLN
3.0000 mL | Freq: Once | RESPIRATORY_TRACT | Status: AC
  Administered 2024-07-24: 3 mL via RESPIRATORY_TRACT
  Filled 2024-07-24: qty 3

## 2024-07-24 MED ORDER — IBUPROFEN 200 MG PO TABS: 600.0000 mg | ORAL_TABLET | Freq: Three times a day (TID) | ORAL | 2 refills | Status: AC | PRN

## 2024-07-24 MED ORDER — AZITHROMYCIN 500 MG PO TABS: 500.0000 mg | ORAL_TABLET | Freq: Every day | ORAL | 0 refills | Status: AC

## 2024-07-24 NOTE — Discharge Instructions (Signed)
 Your evaluation in the emergency department was overall reassuring.  You were somewhat wheezy today, and I suspect you have some bronchospasm from a URI.  I prescribed you an inhaler as well as a course of steroids to help with this.  In case you feel your symptoms are worsening over the next couple days, I did also prescribe a course of antibiotics for possible bacterial throat infection-you can take this as needed.  Please follow-up with primary care provider for reevaluation, and return to the emergency department with any new or worsening symptoms.

## 2024-07-24 NOTE — ED Provider Notes (Signed)
 "  [facility] Provider Note    Event Date/Time   First MD Initiated Contact with Patient 07/24/24 2028     (approximate)   History   Chest Pain and Cough  Pt comes with cp and cough that started few days ago. Pt states vomiting.    HPI Brooke Aguirre is a 29 y.o. female PMH prior UTI/pyelonephritis, CKD, kidney stones presents for evaluation of chest discomfort, cough - cough, congestion, sore throat  x 4-5 days, chills first two days but have resolved - sore throat persistent.  - no ab pain - no urinary sx - some pain w/ coughing and deep breaths - Son at home developed similar symptoms but they have since resolved - States no possibility she could be pregnant, recently completed her menstrual cycle       Physical Exam   Triage Vital Signs: ED Triage Vitals  Encounter Vitals Group     BP 07/24/24 1900 (!) 149/87     Girls Systolic BP Percentile --      Girls Diastolic BP Percentile --      Boys Systolic BP Percentile --      Boys Diastolic BP Percentile --      Pulse Rate 07/24/24 1900 100     Resp 07/24/24 1900 18     Temp 07/24/24 1900 98 F (36.7 C)     Temp src --      SpO2 07/24/24 1900 97 %     Weight 07/24/24 1859 215 lb (97.5 kg)     Height 07/24/24 1859 4' 11 (1.499 m)     Head Circumference --      Peak Flow --      Pain Score 07/24/24 1859 0     Pain Loc --      Pain Education --      Exclude from Growth Chart --     Most recent vital signs: Vitals:   07/24/24 1900  BP: (!) 149/87  Pulse: 100  Resp: 18  Temp: 98 F (36.7 C)  SpO2: 97%     General: Awake, no distress.  HEENT: Oropharyngeal exam overall unremarkable abdomen.  Mild tonsillar erythema, no exudate or swelling.  Trace left submandibular lymphadenopathy, no appreciable neck swelling. CV:  Good peripheral perfusion. RRR, RP 2+ Resp:  Normal effort.  Good airflow throughout though notable expiratory wheezing bilaterally.  No focal coarse breath  sounds. Abd:  No distention. Nontender to deep palpation throughout    ED Results / Procedures / Treatments   Labs (all labs ordered are listed, but only abnormal results are displayed) Labs Reviewed  BASIC METABOLIC PANEL WITH GFR - Abnormal; Notable for the following components:      Result Value   CO2 19 (*)    All other components within normal limits  CBC - Abnormal; Notable for the following components:   WBC 10.9 (*)    Hemoglobin 15.2 (*)    All other components within normal limits  RESP PANEL BY RT-PCR (RSV, FLU A&B, COVID)  RVPGX2  POC URINE PREG, ED  TROPONIN T, HIGH SENSITIVITY     EKG  See ED course below.   RADIOLOGY Radiology interpreted by myself and radiology report reviewed.  No acute pathology identified.    PROCEDURES:  Critical Care performed: No  Procedures   MEDICATIONS ORDERED IN ED: Medications  predniSONE  (DELTASONE ) tablet 60 mg (60 mg Oral Given 07/24/24 2111)  ipratropium-albuterol  (DUONEB) 0.5-2.5 (3) MG/3ML nebulizer solution 3  mL (3 mLs Nebulization Given 07/24/24 2112)     IMPRESSION / MDM / ASSESSMENT AND PLAN / ED COURSE  I reviewed the triage vital signs and the nursing notes.                              DDX/MDM/AP: Differential diagnosis includes, but is not limited to, viral syndrome, consider superimposed pneumonia, do suspect component of reactive airway disease and bronchitis.  Overall suspect URI/viral syndrome, do not suspect bacterial pharyngitis at this time given constellation of symptoms and family member with similar symptoms.  Regarding chest discomfort, suspect mild costochondritis, highly doubt pneumothorax, do not clinically suspect ACS.  Plan: - Labs - Chest x-ray - DuoNeb, prednisone   Patient's presentation is most consistent with acute presentation with potential threat to life or bodily function.  ED course below.  Labs unremarkable, chest x-ray unremarkable, EKG nonischemic.  Feeling better after  DuoNeb.  Will plan for discharge home with albuterol  MDI and prednisone .  Shared decision making, will also prescribe empiric antibiotics for use in about 2 days if not clinically improving with above treatment, patient notes difficulty with potential outpatient follow-up so we will prescribe azithromycin  empirically for possible bacterial pharyngitis if not clinically improving within the next couple days  --history of notable penicillin allergy, has never had cephalosporins.  ED return precautions in place.  Patient agrees with plan.  Clinical Course as of 07/24/24 2117  [place] Jul 24, 2024  2040 CBC with mild leukocytosis, nonspecific.  Mild hemoglobin elevation that is at baseline. [MM]  2041 BMP reviewed, mildly low bicarb, otherwise unremarkable [MM]  2041 Trop wnl Viral swab neg [MM]  2041 CXR: IMPRESSION: 1. No acute cardiopulmonary abnormality.   [MM]  2041 Ecg = sinus rhythm heart rate 98, no gross ST elevation or depression, isolated T wave inversion noted today is nonspecific, borderline left axis deviation, normal intervals.  No clear evidence of ischemia no arrhythmia on my interpretation. [MM]    Clinical Course User Index [MM] Clarine Ozell LABOR, MD     FINAL CLINICAL IMPRESSION(S) / ED DIAGNOSES   Final diagnoses:  Upper respiratory tract infection, unspecified type  Pharyngitis, unspecified etiology  Reactive airway disease without complication, unspecified asthma severity, unspecified whether persistent     Rx / DC Orders   ED Discharge Orders          Ordered    predniSONE  (DELTASONE ) 20 MG tablet  Daily with breakfast        07/24/24 2116    ibuprofen  (MOTRIN  IB) 200 MG tablet  Every 8 hours PRN        07/24/24 2116    albuterol  (VENTOLIN  HFA) 108 (90 Base) MCG/ACT inhaler  Every 6 hours PRN        07/24/24 2116    azithromycin  (ZITHROMAX ) 500 MG tablet  Daily        07/24/24 2116             Note:  This document was prepared using Dragon voice  recognition software and may include unintentional dictation errors.   Clarine Ozell LABOR, MD 07/24/24 2117  "

## 2024-07-24 NOTE — ED Triage Notes (Signed)
 Pt comes with cp and cough that started few days ago. Pt states vomiting.

## 2024-07-24 NOTE — ED Notes (Signed)
 Blue, green, and lav top tubes and resp swab collected, labeled and sent to lab

## 2024-08-10 ENCOUNTER — Telehealth: Payer: Self-pay | Admitting: Physician Assistant

## 2024-08-10 DIAGNOSIS — B9689 Other specified bacterial agents as the cause of diseases classified elsewhere: Secondary | ICD-10-CM

## 2024-08-10 DIAGNOSIS — J208 Acute bronchitis due to other specified organisms: Secondary | ICD-10-CM

## 2024-08-10 MED ORDER — DOXYCYCLINE HYCLATE 100 MG PO TABS: 100.0000 mg | ORAL_TABLET | Freq: Two times a day (BID) | ORAL | 0 refills | Status: AC

## 2024-08-10 MED ORDER — BENZONATATE 100 MG PO CAPS: 100.0000 mg | ORAL_CAPSULE | Freq: Three times a day (TID) | ORAL | 0 refills | Status: AC | PRN

## 2024-08-10 MED ORDER — PREDNISONE 20 MG PO TABS: 40.0000 mg | ORAL_TABLET | Freq: Every day | ORAL | 0 refills | Status: AC

## 2024-08-10 NOTE — Progress Notes (Signed)
 We are sorry that you are not feeling well.  Here is how we plan to help!  Based on your presentation I believe you most likely have A cough due to bacteria.  When patients have a fever and a productive cough with a change in color or increased sputum production, we are concerned about bacterial bronchitis.  If left untreated it can progress to pneumonia.  If your symptoms do not improve with your treatment plan it is important that you contact your provider.   I have prescribed Doxycycline  100 mg twice a day for 7 days     In addition you may use A non-prescription cough medication called Mucinex  DM: take 2 tablets every 12 hours. and A prescription cough medication called Tessalon  Perles 100mg . You may take 1-2 capsules every 8 hours as needed for your cough.  Prednisone  20mg  Take 2 tablets (40mg ) daily for 7 days.  From your responses in the eVisit questionnaire you describe inflammation in the upper respiratory tract which is causing a significant cough.  This is commonly called Bronchitis and has four common causes:   Allergies Viral Infections Acid Reflux Bacterial Infection Allergies, viruses and acid reflux are treated by controlling symptoms or eliminating the cause. An example might be a cough caused by taking certain blood pressure medications. You stop the cough by changing the medication. Another example might be a cough caused by acid reflux. Controlling the reflux helps control the cough.  USE OF BRONCHODILATOR (RESCUE) INHALERS: There is a risk from using your bronchodilator too frequently.  The risk is that over-reliance on a medication which only relaxes the muscles surrounding the breathing tubes can reduce the effectiveness of medications prescribed to reduce swelling and congestion of the tubes themselves.  Although you feel brief relief from the bronchodilator inhaler, your asthma may actually be worsening with the tubes becoming more swollen and filled with mucus.  This can  delay other crucial treatments, such as oral steroid medications. If you need to use a bronchodilator inhaler daily, several times per day, you should discuss this with your provider.  There are probably better treatments that could be used to keep your asthma under control.     HOME CARE Only take medications as instructed by your medical team. Complete the entire course of an antibiotic. Drink plenty of fluids and get plenty of rest. Avoid close contacts especially the very young and the elderly Cover your mouth if you cough or cough into your sleeve. Always remember to wash your hands A steam or ultrasonic humidifier can help congestion.   GET HELP RIGHT AWAY IF: You develop worsening fever. You become short of breath You cough up blood. Your symptoms persist after you have completed your treatment plan MAKE SURE YOU  Understand these instructions. Will watch your condition. Will get help right away if you are not doing well or get worse.  Your e-visit answers were reviewed by a board certified advanced clinical practitioner to complete your personal care plan.  Depending on the condition, your plan could have included both over the counter or prescription medications. If there is a problem please reply  once you have received a response from your provider. Your safety is important to us .  If you have drug allergies check your prescription carefully.    You can use MyChart to ask questions about todays visit, request a non-urgent call back, or ask for a work or school excuse for 24 hours related to this e-Visit. If it has  been greater than 24 hours you will need to follow up with your provider, or enter a new e-Visit to address those concerns. You will get an e-mail in the next two days asking about your experience.  I hope that your e-visit has been valuable and will speed your recovery. Thank you for using e-visits.   I have spent 5 minutes in review of e-visit questionnaire,  review and updating patient chart, medical decision making and response to patient.   Delon CHRISTELLA Dickinson, PA-C
